# Patient Record
Sex: Female | Born: 1940 | Race: Black or African American | Hispanic: No | Marital: Married | Smoking: Former smoker
Health system: Southern US, Community
[De-identification: ages and names within clinical notes are randomized; demographics above are authoritative.]

## PROBLEM LIST (undated history)

## (undated) DIAGNOSIS — H5010 Unspecified exotropia: Secondary | ICD-10-CM

## (undated) DIAGNOSIS — I4510 Unspecified right bundle-branch block: Secondary | ICD-10-CM

## (undated) DIAGNOSIS — E785 Hyperlipidemia, unspecified: Secondary | ICD-10-CM

## (undated) DIAGNOSIS — I444 Left anterior fascicular block: Secondary | ICD-10-CM

## (undated) DIAGNOSIS — I10 Essential (primary) hypertension: Secondary | ICD-10-CM

## (undated) DIAGNOSIS — H50111 Monocular exotropia, right eye: Secondary | ICD-10-CM

## (undated) DIAGNOSIS — Z973 Presence of spectacles and contact lenses: Secondary | ICD-10-CM

## (undated) DIAGNOSIS — Z972 Presence of dental prosthetic device (complete) (partial): Secondary | ICD-10-CM

## (undated) DIAGNOSIS — H409 Unspecified glaucoma: Secondary | ICD-10-CM

## (undated) DIAGNOSIS — E119 Type 2 diabetes mellitus without complications: Secondary | ICD-10-CM

## (undated) DIAGNOSIS — N393 Stress incontinence (female) (male): Secondary | ICD-10-CM

## (undated) HISTORY — PX: GLAUCOMA SURGERY: SHX656

## (undated) HISTORY — PX: EYE SURGERY: SHX253

---

## 1966-11-13 HISTORY — PX: CHOLECYSTECTOMY OPEN: SUR202

## 2000-12-18 ENCOUNTER — Encounter: Payer: Self-pay | Admitting: *Deleted

## 2000-12-18 ENCOUNTER — Ambulatory Visit (HOSPITAL_COMMUNITY): Admission: RE | Admit: 2000-12-18 | Discharge: 2000-12-18 | Payer: Self-pay

## 2002-07-24 ENCOUNTER — Encounter: Payer: Self-pay | Admitting: *Deleted

## 2002-07-24 ENCOUNTER — Encounter: Admission: RE | Admit: 2002-07-24 | Discharge: 2002-07-24 | Payer: Self-pay | Admitting: *Deleted

## 2005-10-06 ENCOUNTER — Ambulatory Visit (HOSPITAL_COMMUNITY): Admission: RE | Admit: 2005-10-06 | Discharge: 2005-10-06 | Payer: Self-pay | Admitting: *Deleted

## 2005-10-24 ENCOUNTER — Ambulatory Visit (HOSPITAL_COMMUNITY): Admission: RE | Admit: 2005-10-24 | Discharge: 2005-10-24 | Payer: Self-pay | Admitting: *Deleted

## 2006-03-21 ENCOUNTER — Ambulatory Visit: Payer: Self-pay | Admitting: Family Medicine

## 2007-08-12 ENCOUNTER — Encounter: Admission: RE | Admit: 2007-08-12 | Discharge: 2007-08-12 | Payer: Self-pay | Admitting: Family Medicine

## 2007-08-21 ENCOUNTER — Encounter: Admission: RE | Admit: 2007-08-21 | Discharge: 2007-08-21 | Payer: Self-pay | Admitting: Family Medicine

## 2009-06-11 ENCOUNTER — Emergency Department (HOSPITAL_COMMUNITY): Admission: EM | Admit: 2009-06-11 | Discharge: 2009-06-11 | Payer: Self-pay | Admitting: Emergency Medicine

## 2009-07-27 ENCOUNTER — Encounter: Admission: RE | Admit: 2009-07-27 | Discharge: 2009-07-27 | Payer: Self-pay | Admitting: Neurology

## 2010-03-03 ENCOUNTER — Encounter (INDEPENDENT_AMBULATORY_CARE_PROVIDER_SITE_OTHER): Payer: Self-pay | Admitting: *Deleted

## 2010-03-09 ENCOUNTER — Encounter: Admission: RE | Admit: 2010-03-09 | Discharge: 2010-03-09 | Payer: Self-pay | Admitting: Family Medicine

## 2010-03-10 ENCOUNTER — Encounter: Admission: RE | Admit: 2010-03-10 | Discharge: 2010-03-10 | Payer: Self-pay | Admitting: Family Medicine

## 2010-03-16 ENCOUNTER — Encounter (INDEPENDENT_AMBULATORY_CARE_PROVIDER_SITE_OTHER): Payer: Self-pay

## 2010-03-18 ENCOUNTER — Ambulatory Visit: Payer: Self-pay | Admitting: Gastroenterology

## 2010-03-18 ENCOUNTER — Encounter (INDEPENDENT_AMBULATORY_CARE_PROVIDER_SITE_OTHER): Payer: Self-pay

## 2010-04-01 ENCOUNTER — Ambulatory Visit: Payer: Self-pay | Admitting: Gastroenterology

## 2010-08-16 ENCOUNTER — Emergency Department (HOSPITAL_COMMUNITY): Admission: EM | Admit: 2010-08-16 | Discharge: 2010-08-16 | Payer: Self-pay | Admitting: Emergency Medicine

## 2010-12-13 NOTE — Letter (Signed)
Summary: Diabetic Instructions  [facility]  [address] [place], [place] [address]   Phone: [phone]  Fax: [phone]    Katherine [name] [birth year] MRN: [id number]   _  _   ORAL DIABETIC MEDICATION INSTRUCTIONS  The day before your procedure:   Take your diabetic pill as you do normally  The day of your procedure:   Do not take your diabetic pill    We will check your blood sugar levels during the admission process and again in Recovery before discharging you home  ________________________________________________________________________  _  _   INSULIN (LONG ACTING) MEDICATION INSTRUCTIONS (Lantus, NPH, 70/30, Humulin, Novolin-N)   The day before your procedure:   Take  your regular evening dose    The day of your procedure:   Do not take your morning dose    _  _   INSULIN (SHORT ACTING) MEDICATION INSTRUCTIONS (Regular, Humulog, Novolog)   The day before your procedure:   Do not take your evening dose   The day of your procedure:   Do not take your morning dose   _  _   INSULIN PUMP MEDICATION INSTRUCTIONS  We will contact the physician managing your diabetic care for written dosage instructions for the day before your procedure and the day of your procedure.  Once we have received the instructions, we will contact you.

## 2010-12-13 NOTE — Letter (Signed)
Summary: Previsit letter  [facility]  [address] [place], [place] [address]   Phone: [phone]  Fax: [phone]       03/03/2010 MRN: [id number]  [facility] [address] [place], [place]  [address]  Dear Ms. Hannon,  Welcome to the Gastroenterology Division at [facility].    You are scheduled to see a nurse for your pre-procedure visit on 03-18-10 at 11:00a.m. on the 3rd floor at [facility], [address]  We ask that you try to arrive at our office 15 minutes prior to your appointment time to allow for check-in.  Your nurse visit will consist of discussing your medical and surgical history, your immediate family medical history, and your medications.    Please bring a complete list of all your medications or, if you prefer, bring the medication bottles and we will list them.  We will need to be aware of both prescribed and over the counter drugs.  We will need to know exact dosage information as well.  If you are on blood thinners (Coumadin, Plavix, Aggrenox, Ticlid, etc.) please call our office today/prior to your appointment, as we need to consult with your physician about holding your medication.   Please be prepared to read and sign documents such as consent forms, a financial agreement, and acknowledgement forms.  If necessary, and with your consent, a friend or relative is welcome to sit-in on the nurse visit with you.  Please bring your insurance card so that we may make a copy of it.  If your insurance requires a referral to see a specialist, please bring your referral form from your primary care physician.  No co-pay is required for this nurse visit.     If you cannot keep your appointment, please call [phone] to cancel or reschedule prior to your appointment date.  This allows [place] the opportunity to schedule an appointment for another patient in need of care.    Thank you for choosing [facility] for your medical needs.   We appreciate the opportunity to care for you.  Please visit [place] at our website  to learn more about our practice.                     Sincerely.                                                                                                                   The Gastroenterology Division

## 2010-12-13 NOTE — Procedures (Signed)
Summary: Colonoscopy  Patient: Katherine [name] Note: All result statuses are Final unless otherwise noted.  Tests: (1) Colonoscopy (COL)   COL Colonoscopy           DONE     [facility]     [address]     [place], [place]  [address]           COLONOSCOPY PROCEDURE REPORT           PATIENT:  Katherine, [name]  MR#:  [id number]     BIRTHDATE:  06-17-1941, 69 yrs. old  GENDER:  female           ENDOSCOPIST:  Barbette Hair. Arlyce Dice, MD     Referred by:           PROCEDURE DATE:  04/01/2010     PROCEDURE:  Diagnostic Colonoscopy     ASA CLASS:  Class II     INDICATIONS:  1) Routine Risk Screening           MEDICATIONS:   Fentanyl 75 mcg IV, Versed 6 mg IV           DESCRIPTION OF PROCEDURE:   After the risks benefits and     alternatives of the procedure were thoroughly explained, informed     consent was obtained.  Digital rectal exam was performed and     revealed rectal prolapse.   The Pentax Ped Colon [id number] endoscope     was introduced through the anus and advanced to the cecum, which     was identified by the ileocecal valve, without limitations.  The     quality of the prep was excellent, using MoviPrep.  The instrument     was then slowly withdrawn as the colon was fully     examined.<<PROCEDUREIMAGES>>           FINDINGS:  Diverticula were found (see image1 and image11).     Moderate diverticulosis sigmoid to ascending colon  This was     otherwise a normal examination of the colon (see image4, image5,     image6, image7, image9, image10, image12, and image13).     Retroflexed views in the rectum revealed no abnormalities.    The     time to cecum =  3.75  minutes. The scope was then withdrawn (time     =  6.75  min) from the patient and the procedure completed.           COMPLICATIONS:  None           ENDOSCOPIC IMPRESSION:     1) Diverticula     2) Otherwise normal examination     RECOMMENDATIONS:     1) Continue current colorectal screening recommendations  for     "routine risk" patients with a repeat colonoscopy in 10 years.           REPEAT EXAM:  In 10 year(s) for Colonoscopy.           ______________________________     Barbette Hair. Arlyce Dice, MD           CC: Clyda Greener, MD           n.     Rosalie Doctor:   Barbette Hair. Kaplan at 04/01/2010 12:30 PM           Faythe Ghee, [id number]  Note: An exclamation mark (!) indicates a result that was not dispersed into the flowsheet. Document Creation Date: 04/01/2010 1:54 PM _______________________________________________________________________  Marland Kitchen  1) Order result status: Final Collection or observation date-time: 04/01/2010 12:25 Requested date-time:  Receipt date-time:  Reported date-time:  Referring Physician:   Ordering Physician: Melvia Heaps [phone]) Specimen Source:  Source: Launa Grill Order Number: [id number] Lab site:   Appended Document: Colonoscopy    Clinical Lists Changes  Observations: Added new observation of COLONNXTDUE: 03/2020 (04/01/2010 13:33)

## 2010-12-13 NOTE — Letter (Signed)
Summary: [facility] Instructions  [facility]  [address] [place], [place] [address]   Phone: [phone]  Fax: [phone]       EMMAMARIE KLUENDER    [birth year]    MRN: [id number]        Procedure Day Dorna Bloom:  Farrell Ours  04/01/10     Arrival Time:  10:30AM      Procedure Time:  11:30AM     Location of Procedure:                    _X _  [facility] (4th Floor)                        PREPARATION FOR COLONOSCOPY WITH MOVIPREP   Starting 5 days prior to your procedure 03/27/10 do not eat nuts, seeds, popcorn, corn, beans, peas,  salads, or any raw vegetables.  Do not take any fiber supplements (e.g. Metamucil, Citrucel, and Benefiber).  THE DAY BEFORE YOUR PROCEDURE         DATE: 03/31/10  DAY: THURSDAY  1.  Drink clear liquids the entire day-NO SOLID FOOD  2.  Do not drink anything colored red or purple.  Avoid juices with pulp.  No orange juice.  3.  Drink at least 64 oz. (8 glasses) of fluid/clear liquids during the day to prevent dehydration and help the prep work efficiently.  CLEAR LIQUIDS INCLUDE: Water Jello Ice Popsicles Tea (sugar ok, no milk/cream) Powdered fruit flavored drinks Coffee (sugar ok, no milk/cream) Gatorade Juice: apple, white grape, white cranberry  Lemonade Clear bullion, consomm, broth Carbonated beverages (any kind) Strained chicken noodle soup Hard Candy                             4.  In the morning, mix first dose of MoviPrep solution:    Empty 1 Pouch A and 1 Pouch B into the disposable container    Add lukewarm drinking water to the top line of the container. Mix to dissolve    Refrigerate (mixed solution should be used within 24 hrs)  5.  Begin drinking the prep at 5:00 p.m. The MoviPrep container is divided by 4 marks.   Every 15 minutes drink the solution down to the next mark (approximately 8 oz) until the full liter is complete.   6.  Follow completed prep with 16 oz of clear liquid of your choice  (Nothing red or purple).  Continue to drink clear liquids until bedtime.  7.  Before going to bed, mix second dose of MoviPrep solution:    Empty 1 Pouch A and 1 Pouch B into the disposable container    Add lukewarm drinking water to the top line of the container. Mix to dissolve    Refrigerate  THE DAY OF YOUR PROCEDURE      DATE: 04/01/10   DAY: FRIDAY  Beginning at 6:30AM (5 hours before procedure):         1. Every 15 minutes, drink the solution down to the next mark (approx 8 oz) until the full liter is complete.  2. Follow completed prep with 16 oz. of clear liquid of your choice.    3. You may drink clear liquids until 9:30AM (2 HOURS BEFORE PROCEDURE).   MEDICATION INSTRUCTIONS  Unless otherwise instructed, you should take regular prescription medications with a small sip of water   as early as possible  the morning of your procedure.  Diabetic patients - see separate instructions.         OTHER INSTRUCTIONS  You will need a responsible adult at least 68 years of age to accompany you and drive you home.   This person must remain in the waiting room during your procedure.  Wear loose fitting clothing that is easily removed.  Leave jewelry and other valuables at home.  However, you may wish to bring a book to read or  an iPod/MP3 player to listen to music as you wait for your procedure to start.  Remove all body piercing jewelry and leave at home.  Total time from sign-in until discharge is approximately 2-3 hours.  You should go home directly after your procedure and rest.  You can resume normal activities the  day after your procedure.  The day of your procedure you should not:   Drive   Make legal decisions   Operate machinery   Drink alcohol   Return to work  You will receive specific instructions about eating, activities and medications before you leave.    The above instructions have been reviewed and explained to me by   Ulis Rias RN  Mar 18, 2010 10:53 AM     I fully understand and can verbalize these instructions _____________________________ Date _________

## 2010-12-13 NOTE — Miscellaneous (Signed)
Summary: Lec previsit  Clinical Lists Changes  Medications: Added new medication of MOVIPREP 100 GM  SOLR (PEG-KCL-NACL-NASULF-NA ASC-C) As per prep instructions. - Signed Rx of MOVIPREP 100 GM  SOLR (PEG-KCL-NACL-NASULF-NA ASC-C) As per prep instructions.;  #1 x 0;  Signed;  Entered by: Ulis Rias RN;  Authorized by: Louis Meckel MD;  Method used: Electronically to [id number] [address] [place], [place], [place]  [address], Ph: [phone] or [phone], Fax: [phone] Observations: Added new observation of NKA: T (03/18/2010 10:05)    Prescriptions: MOVIPREP 100 GM  SOLR (PEG-KCL-NACL-NASULF-NA ASC-C) As per prep instructions.  #1 x 0   Entered by:   Ulis Rias RN   Authorized by:   Louis Meckel MD   Signed by:   Ulis Rias RN on 03/18/2010   Method used:   Electronically to        [facility] [phone]* (retail)       [address]       [place], [place]  [address]       Ph: [phone] or [phone]       Fax: [phone]   RxID:   [id number]

## 2011-02-19 LAB — URINALYSIS, ROUTINE W REFLEX MICROSCOPIC
Ketones, ur: NEGATIVE mg/dL
Nitrite: NEGATIVE
Protein, ur: NEGATIVE mg/dL
Urobilinogen, UA: 1 mg/dL (ref 0.0–1.0)

## 2011-02-19 LAB — DIFFERENTIAL
Basophils Absolute: 0 10*3/uL (ref 0.0–0.1)
Basophils Relative: 0 % (ref 0–1)
Eosinophils Absolute: 0.1 10*3/uL (ref 0.0–0.7)
Lymphocytes Relative: 41 % (ref 12–46)
Monocytes Absolute: 0.5 10*3/uL (ref 0.1–1.0)

## 2011-02-19 LAB — BASIC METABOLIC PANEL
BUN: 12 mg/dL (ref 6–23)
Chloride: 107 mEq/L (ref 96–112)
GFR calc non Af Amer: >60 mL/min (ref >60)
Glucose, Bld: 191 mg/dL — ABNORMAL HIGH (ref 70–99)
Sodium: 139 mEq/L (ref 135–145)

## 2011-02-19 LAB — CBC
HCT: 40.1 % (ref 36.0–46.0)
MCHC: 34.1 g/dL (ref 30.0–36.0)
MCV: 92.1 fL (ref 78.0–100.0)
RBC: 4.35 MIL/uL (ref 3.87–5.11)
RDW: 12.6 % (ref 11.5–15.5)
WBC: 6.2 10*3/uL (ref 4.0–10.5)

## 2011-02-19 LAB — URINE CULTURE: Colony Count: 75000

## 2011-02-28 ENCOUNTER — Ambulatory Visit
Admission: RE | Admit: 2011-02-28 | Discharge: 2011-02-28 | Disposition: A | Discharge status: Home / Self Care | Payer: Medicare Other | Source: Ambulatory Visit | Attending: Family Medicine | Admitting: Family Medicine

## 2011-02-28 ENCOUNTER — Other Ambulatory Visit: Payer: Self-pay | Admitting: Family Medicine

## 2011-02-28 DIAGNOSIS — S2239XA Fracture of one rib, unspecified side, initial encounter for closed fracture: Secondary | ICD-10-CM

## 2011-06-29 ENCOUNTER — Other Ambulatory Visit: Payer: Self-pay | Admitting: Family Medicine

## 2011-06-29 ENCOUNTER — Ambulatory Visit
Admission: RE | Admit: 2011-06-29 | Discharge: 2011-06-29 | Disposition: A | Discharge status: Home / Self Care | Payer: Medicare Other | Source: Ambulatory Visit | Attending: Family Medicine | Admitting: Family Medicine

## 2011-06-29 DIAGNOSIS — S2239XA Fracture of one rib, unspecified side, initial encounter for closed fracture: Secondary | ICD-10-CM

## 2012-06-25 ENCOUNTER — Other Ambulatory Visit (HOSPITAL_COMMUNITY): Payer: Self-pay | Admitting: Family Medicine

## 2012-06-25 ENCOUNTER — Ambulatory Visit (HOSPITAL_COMMUNITY)
Admission: RE | Admit: 2012-06-25 | Discharge: 2012-06-25 | Disposition: A | Discharge status: Home / Self Care | Payer: Medicare Other | Source: Ambulatory Visit | Attending: Family Medicine | Admitting: Family Medicine

## 2012-06-25 ENCOUNTER — Inpatient Hospital Stay (HOSPITAL_COMMUNITY): Admission: RE | Admit: 2012-06-25 | Payer: Medicare Other | Source: Ambulatory Visit

## 2012-06-25 DIAGNOSIS — I10 Essential (primary) hypertension: Secondary | ICD-10-CM

## 2012-06-25 DIAGNOSIS — R911 Solitary pulmonary nodule: Secondary | ICD-10-CM | POA: Insufficient documentation

## 2012-06-25 DIAGNOSIS — F17200 Nicotine dependence, unspecified, uncomplicated: Secondary | ICD-10-CM | POA: Insufficient documentation

## 2012-06-25 DIAGNOSIS — I452 Bifascicular block: Secondary | ICD-10-CM | POA: Insufficient documentation

## 2012-06-25 DIAGNOSIS — R9431 Abnormal electrocardiogram [ECG] [EKG]: Secondary | ICD-10-CM | POA: Insufficient documentation

## 2012-06-25 DIAGNOSIS — R000 Tachycardia, unspecified: Secondary | ICD-10-CM

## 2012-06-25 LAB — COMPREHENSIVE METABOLIC PANEL
ALT: 17 U/L (ref 0–35)
Alkaline Phosphatase: 75 U/L (ref 39–117)
Calcium: 10 mg/dL (ref 8.4–10.5)
GFR calc Af Amer: >90 mL/min (ref >90)
GFR calc non Af Amer: 87 mL/min — ABNORMAL LOW (ref >90)
Total Bilirubin: 0.3 mg/dL (ref 0.3–1.2)
Total Protein: 7.6 g/dL (ref 6.0–8.3)

## 2012-06-25 LAB — LIPID PANEL
Cholesterol: 172 mg/dL (ref 0–200)
LDL Cholesterol: 46 mg/dL (ref 0–99)
Triglycerides: 125 mg/dL (ref <150)

## 2012-06-25 LAB — HEMOGLOBIN A1C
Hgb A1c MFr Bld: 6.5 % — ABNORMAL HIGH (ref <5.7)
Mean Plasma Glucose: 140 mg/dL — ABNORMAL HIGH (ref <117)

## 2012-06-25 LAB — CBC
HCT: 40.7 % (ref 36.0–46.0)
Hemoglobin: 13.5 g/dL (ref 12.0–15.0)
MCHC: 33.2 g/dL (ref 30.0–36.0)
MCV: 89.6 fL (ref 78.0–100.0)
RBC: 4.54 MIL/uL (ref 3.87–5.11)

## 2012-06-25 LAB — TSH: TSH: 0.585 u[IU]/mL (ref 0.350–4.500)

## 2012-07-01 ENCOUNTER — Other Ambulatory Visit (HOSPITAL_COMMUNITY): Payer: Self-pay | Admitting: Family Medicine

## 2012-07-01 DIAGNOSIS — R911 Solitary pulmonary nodule: Secondary | ICD-10-CM

## 2012-07-03 ENCOUNTER — Ambulatory Visit (HOSPITAL_COMMUNITY)
Admission: RE | Admit: 2012-07-03 | Discharge: 2012-07-03 | Disposition: A | Discharge status: Home / Self Care | Payer: Medicare Other | Source: Ambulatory Visit | Attending: Family Medicine | Admitting: Family Medicine

## 2012-07-03 DIAGNOSIS — R918 Other nonspecific abnormal finding of lung field: Secondary | ICD-10-CM | POA: Insufficient documentation

## 2012-07-03 DIAGNOSIS — R911 Solitary pulmonary nodule: Secondary | ICD-10-CM

## 2012-07-03 MED ORDER — IOHEXOL 300 MG/ML  SOLN
80.0000 mL | Freq: Once | INTRAMUSCULAR | Status: AC | PRN
  Administered 2012-07-03: 80 mL via INTRAVENOUS

## 2012-08-30 ENCOUNTER — Emergency Department (HOSPITAL_COMMUNITY): Payer: Medicare Other

## 2012-08-30 ENCOUNTER — Emergency Department (HOSPITAL_COMMUNITY)
Admission: EM | Admit: 2012-08-30 | Discharge: 2012-08-30 | Disposition: A | Discharge status: Home / Self Care | Payer: Medicare Other | Attending: Emergency Medicine | Admitting: Emergency Medicine

## 2012-08-30 ENCOUNTER — Encounter (HOSPITAL_COMMUNITY): Payer: Self-pay | Admitting: *Deleted

## 2012-08-30 DIAGNOSIS — E119 Type 2 diabetes mellitus without complications: Secondary | ICD-10-CM | POA: Insufficient documentation

## 2012-08-30 DIAGNOSIS — S1093XA Contusion of unspecified part of neck, initial encounter: Secondary | ICD-10-CM | POA: Insufficient documentation

## 2012-08-30 DIAGNOSIS — R220 Localized swelling, mass and lump, head: Secondary | ICD-10-CM | POA: Insufficient documentation

## 2012-08-30 DIAGNOSIS — I10 Essential (primary) hypertension: Secondary | ICD-10-CM | POA: Insufficient documentation

## 2012-08-30 DIAGNOSIS — R0602 Shortness of breath: Secondary | ICD-10-CM | POA: Insufficient documentation

## 2012-08-30 DIAGNOSIS — M542 Cervicalgia: Secondary | ICD-10-CM | POA: Insufficient documentation

## 2012-08-30 DIAGNOSIS — T1490XA Injury, unspecified, initial encounter: Secondary | ICD-10-CM | POA: Insufficient documentation

## 2012-08-30 DIAGNOSIS — S0003XA Contusion of scalp, initial encounter: Secondary | ICD-10-CM | POA: Insufficient documentation

## 2012-08-30 DIAGNOSIS — M549 Dorsalgia, unspecified: Secondary | ICD-10-CM | POA: Insufficient documentation

## 2012-08-30 HISTORY — DX: Essential (primary) hypertension: I10

## 2012-08-30 MED ORDER — HYDROCODONE-ACETAMINOPHEN 5-325 MG PO TABS
1.0000 | ORAL_TABLET | Freq: Once | ORAL | Status: AC
  Administered 2012-08-30: 1 via ORAL

## 2012-08-30 MED ORDER — HYDROCODONE-ACETAMINOPHEN 5-325 MG PO TABS: 1.0000 | ORAL_TABLET | Freq: Four times a day (QID) | ORAL | Status: DC | PRN

## 2012-08-30 NOTE — ED Notes (Signed)
[employer] informed.

## 2012-08-30 NOTE — ED Notes (Signed)
[employer] in with pt. 
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
## 2012-08-30 NOTE — ED Provider Notes (Signed)
Medical screening examination/treatment/procedure(s) were performed by non-physician practitioner and as supervising physician I was immediately available for consultation/collaboration.  Kollin Udell, MD 08/30/12 1722 

## 2013-05-28 IMAGING — CT CT CHEST W/ CM
2 of 4 series · 15 of 36 positions shown, 18 images · IV contrast (OMNIPAQUE)
Comparison: Chest radiograph 06/26/2011

CLINICAL DATA: Evaluate for a right upper lobe nodule.

CT CHEST WITH CONTRAST
TECHNIQUE: Multidetector CT imaging of the chest was performed
following the standard protocol during bolus administration of
intravenous contrast.
Contrast: 80mL OMNIPAQUE IOHEXOL 300 MG/ML  SOLN

[Series 2: chest with st · axial · 0.74mm/px · z∈[-189,+91]mm · 12 of 66 slices shown, 15 images]
[im 5/66  mediastinal]
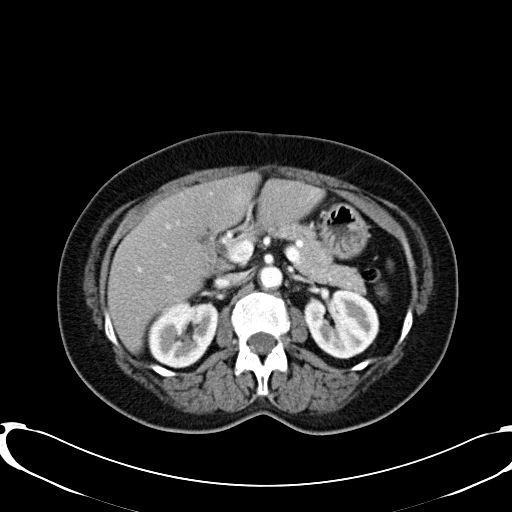
[im 5/66  lung]
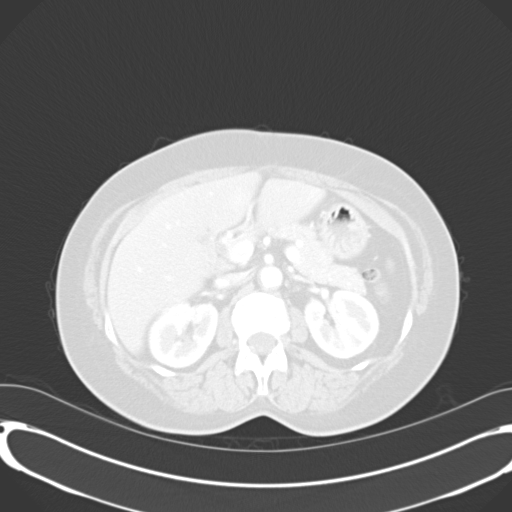
[im 10/66  lung]
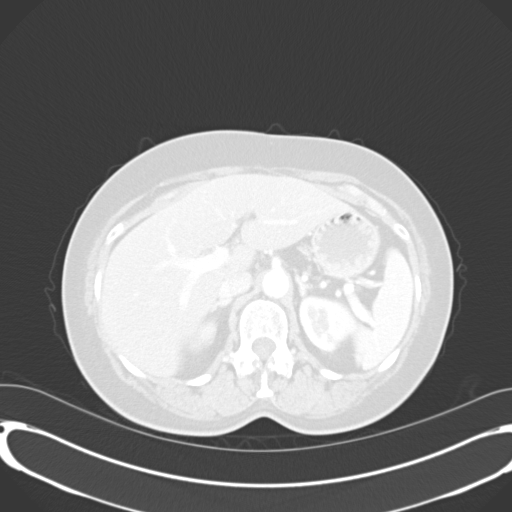
[im 14/66  lung]
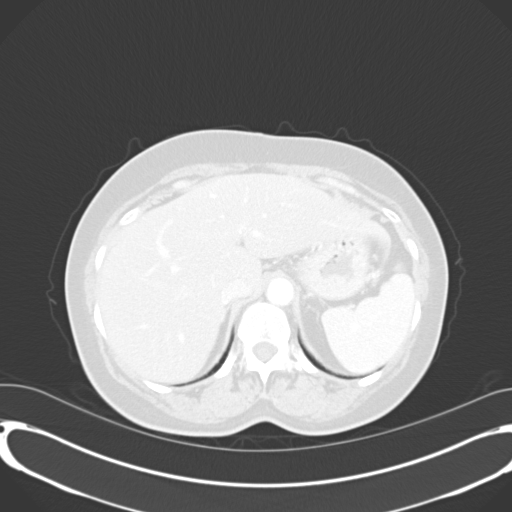
[im 19/66  lung]
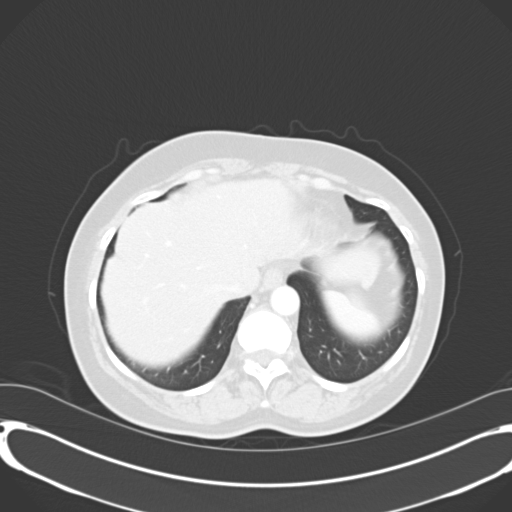
[im 24/66  mediastinal]
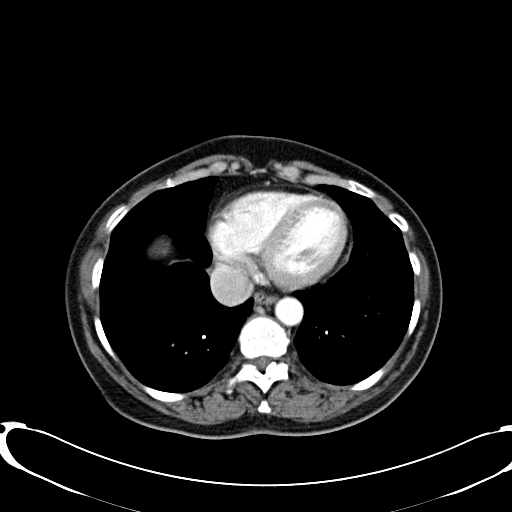
[im 24/66  lung]
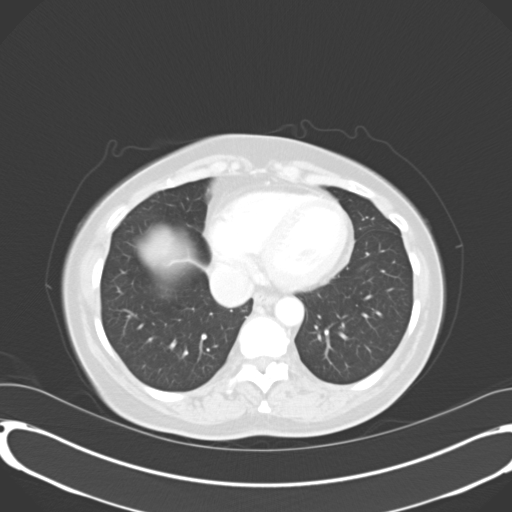
[im 28/66  lung]
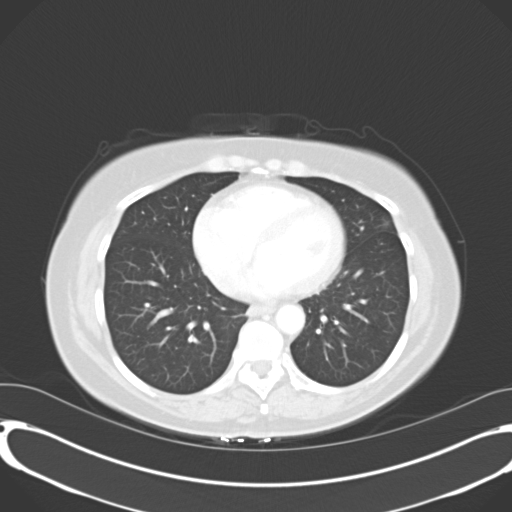
[im 38/66  lung]
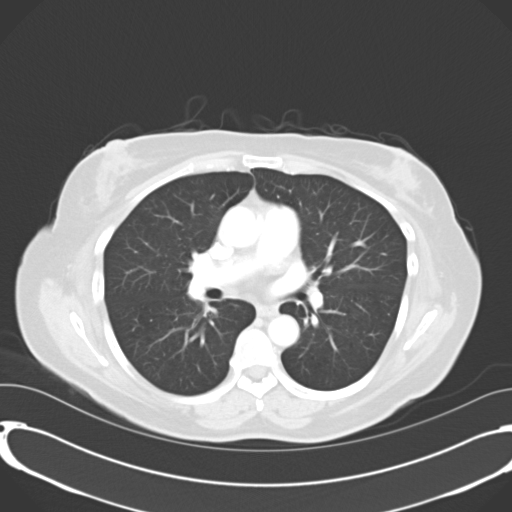
[im 42/66  lung]
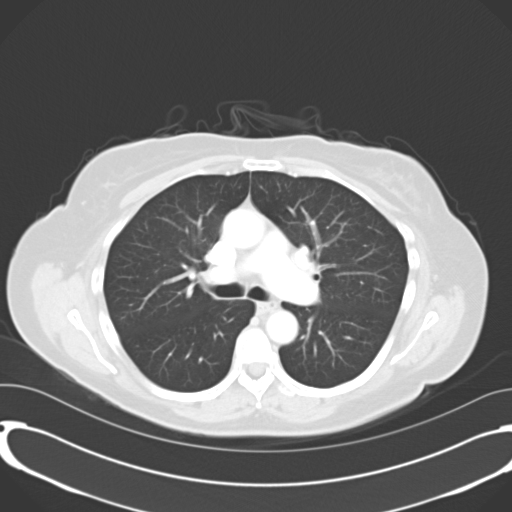
[im 47/66  mediastinal]
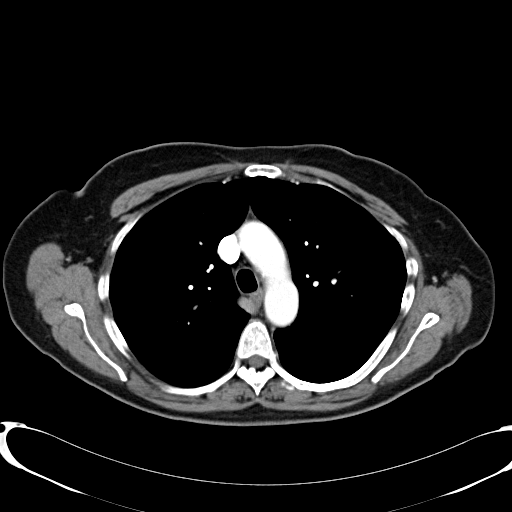
[im 47/66  lung]
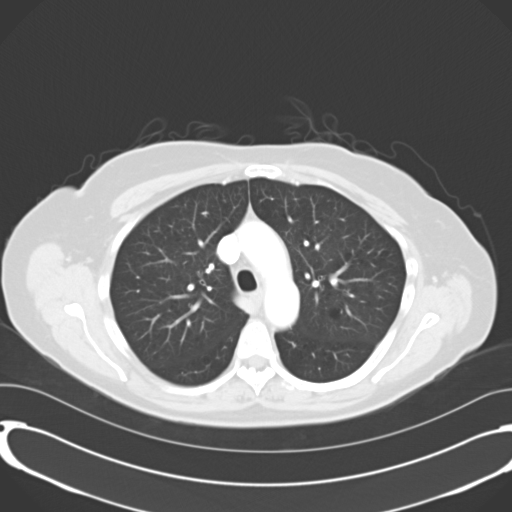
[im 52/66  lung]
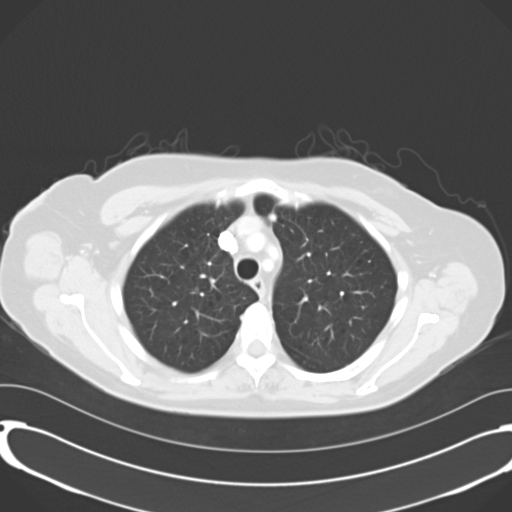
[im 56/66  lung]
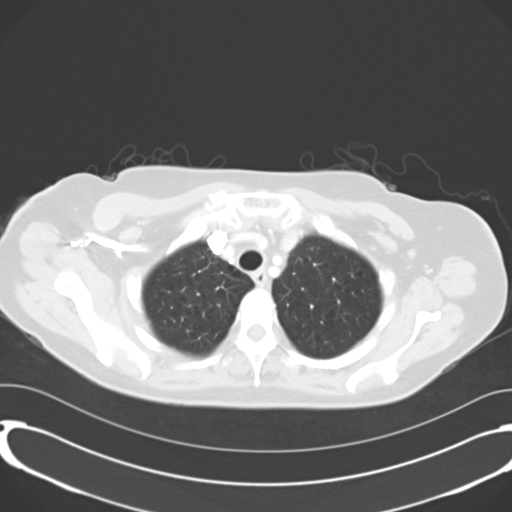
[im 61/66  lung]
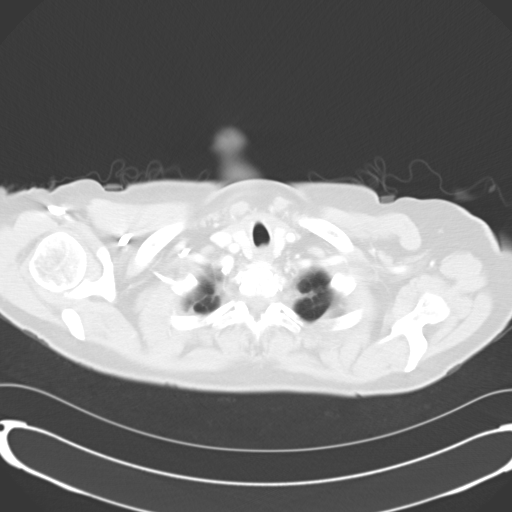

[Series 602: <mpr thick range> · coronal · 0.74mm/px · 3 of 76 slices shown]
[im 16/76  lung]
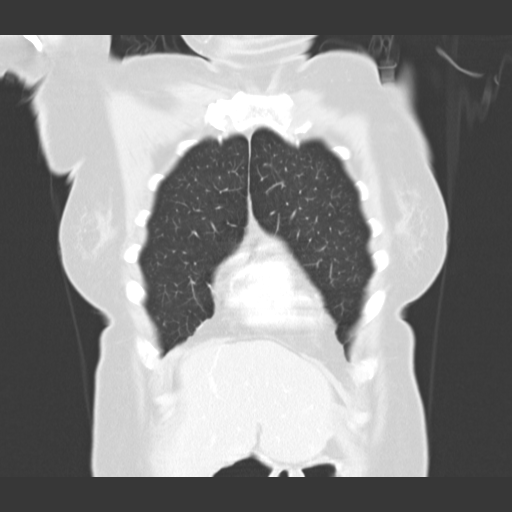
[im 31/76  lung]
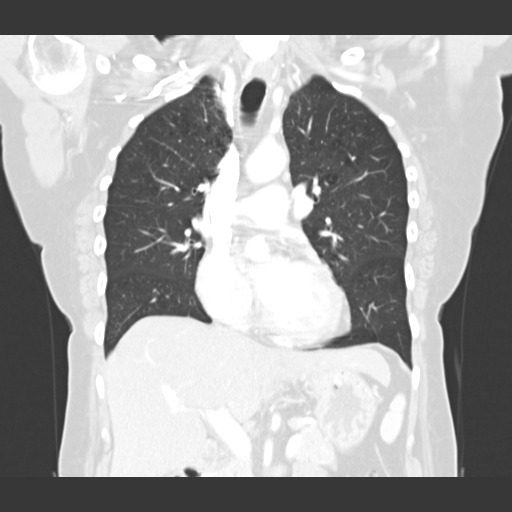
[im 46/76  lung]
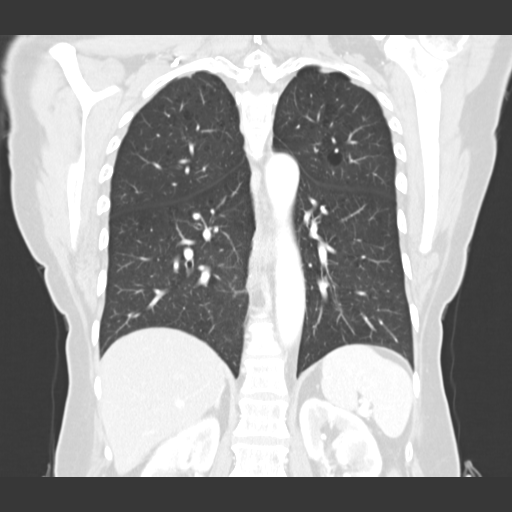

[15 of 36 positions shown; findings below may reference images not displayed]

FINDINGS: There is no evidence for chest lymphadenopathy.  No
significant pericardial or pleural fluid.  Decreased attenuation of
the liver could represent some hepatic steatosis.  Normal
appearance of the adrenal tissue, spleen, visualized kidneys and
visualized pancreas.  Main portal venous system is patent.

The trachea and mainstem bronchi are patent. There are mild
emphysematous changes in the upper lungs.  There are no suspicious
pulmonary nodules.  Lungs are clear without parenchymal disease.
No acute bony abnormality. There are prominent degenerative changes
along the anterior right first rib and this may account for the
abnormality on the chest radiograph.  There is a 5 mm dense nodular
structure in the left anterior chest just anterior to the left
innominate vein.  This is probably a calcification or a vascular
structure.
IMPRESSION: There are no suspicious pulmonary nodules.  The density on the
recent chest radiograph most likely represents bone.

No acute chest abnormalities.

## 2013-08-06 ENCOUNTER — Other Ambulatory Visit: Payer: Self-pay | Admitting: Family Medicine

## 2013-08-06 ENCOUNTER — Ambulatory Visit
Admission: RE | Admit: 2013-08-06 | Discharge: 2013-08-06 | Disposition: A | Discharge status: Home / Self Care | Payer: Medicare Other | Source: Ambulatory Visit | Attending: Family Medicine | Admitting: Family Medicine

## 2013-08-06 DIAGNOSIS — I10 Essential (primary) hypertension: Secondary | ICD-10-CM

## 2013-11-30 ENCOUNTER — Encounter (HOSPITAL_COMMUNITY): Payer: Self-pay | Admitting: Emergency Medicine

## 2013-11-30 ENCOUNTER — Emergency Department (INDEPENDENT_AMBULATORY_CARE_PROVIDER_SITE_OTHER)
Admission: EM | Admit: 2013-11-30 | Discharge: 2013-11-30 | Disposition: A | Discharge status: Home / Self Care | Payer: Medicare Other | Source: Home / Self Care | Attending: Emergency Medicine | Admitting: Emergency Medicine

## 2013-11-30 DIAGNOSIS — K047 Periapical abscess without sinus: Secondary | ICD-10-CM

## 2013-11-30 DIAGNOSIS — H579 Unspecified disorder of eye and adnexa: Secondary | ICD-10-CM

## 2013-11-30 DIAGNOSIS — H5789 Other specified disorders of eye and adnexa: Secondary | ICD-10-CM

## 2013-11-30 MED ORDER — PENICILLIN V POTASSIUM 500 MG PO TABS: 500.0000 mg | ORAL_TABLET | Freq: Four times a day (QID) | ORAL | Status: AC

## 2013-11-30 MED ORDER — HYDROCODONE-ACETAMINOPHEN 5-325 MG PO TABS
1.0000 | ORAL_TABLET | Freq: Four times a day (QID) | ORAL | Status: DC | PRN
Start: 2013-11-30 — End: 2015-05-13

## 2013-11-30 MED ORDER — TETRACAINE HCL 0.5 % OP SOLN
OPHTHALMIC | Status: AC
  Filled 2013-11-30: qty 2

## 2013-11-30 MED ORDER — TETRACAINE HCL 0.5 % OP SOLN
2.0000 [drp] | Freq: Once | OPHTHALMIC | Status: AC
  Administered 2013-11-30: 2 [drp] via OPHTHALMIC

## 2013-11-30 NOTE — ED Provider Notes (Signed)
Medical screening examination/treatment/procedure(s) were performed by non-physician practitioner and as supervising physician I was immediately available for consultation/collaboration.  Philipp Deputy, M.D.  Harden Mo, MD 11/30/13 2139

## 2013-11-30 NOTE — ED Provider Notes (Signed)
CSN: [id number]     Arrival date & time 11/30/13  8527 History   First MD Initiated Contact with Patient 11/30/13 1212     Chief Complaint  Patient presents with  . Dental Pain    eye pain   (Consider location/radiation/quality/duration/timing/severity/associated sxs/prior Treatment) HPI Comments: Pt feels something got into her eye last night, and is moving around causing her pain.   Patient is a 73 y.o. female presenting with tooth pain and foreign body in eye. The history is provided by the patient.  Dental Pain Location:  Lower Lower teeth location:  27/RL cuspid Quality:  Aching Severity:  Moderate Onset quality:  Gradual Duration:  4 days Timing:  Constant Progression:  Worsening Chronicity:  New Relieved by:  None tried Worsened by:  Nothing tried Ineffective treatments:  None tried Associated symptoms: gum swelling   Associated symptoms: no difficulty swallowing, no facial swelling and no fever   Risk factors: diabetes   Foreign Body in Eye This is a new problem. The current episode started 12 to 24 hours ago. The problem occurs constantly. The problem has not changed since onset.Nothing aggravates the symptoms. Nothing relieves the symptoms. She has tried nothing for the symptoms.    Past Medical History  Diagnosis Date  . Diabetes mellitus without complication   . Hypertension    Past Surgical History  Procedure Laterality Date  . Cholecystectomy     History reviewed. No pertinent family history. History  Substance Use Topics  . Smoking status: Current Every Day Smoker -- 0.50 packs/day    Types: Cigarettes  . Smokeless tobacco: Not on file  . Alcohol Use: 2.4 oz/week    4 Cans of beer per week   OB History   Grav Para Term Preterm Abortions TAB SAB Ect Mult Living                 Review of Systems  Constitutional: Negative for fever and chills.  HENT: Positive for dental problem. Negative for facial swelling.   Eyes: Positive for pain. Negative for  visual disturbance.       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  Home Medications   Current Outpatient Rx  Name  Route  Sig  Dispense  Refill  . glyBURIDE (DIABETA) 5 MG tablet   Oral   Take 5-10 mg by mouth 2 (two) times daily with a meal. 2 tabs in am, 1 tab in pm         . quinapril (ACCUPRIL) 10 MG tablet   Oral   Take 10 mg by mouth daily.         Marland Kitchen HYDROcodone-acetaminophen (NORCO/VICODIN) 5-325 MG per tablet   Oral   Take 1 tablet by mouth every 6 (six) hours as needed. For pain.         Marland Kitchen HYDROcodone-acetaminophen (NORCO/VICODIN) 5-325 MG per tablet   Oral   Take 1 tablet by mouth every 6 (six) hours as needed.   6 tablet   0   . penicillin v potassium (VEETID) 500 MG tablet   Oral   Take 1 tablet (500 mg total) by mouth 4 (four) times daily.   40 tablet   0   . PRESCRIPTION MEDICATION   Oral   Take 1 tablet by mouth daily. Blood pressure  medication.          BP 131/67  Pulse 82  Temp(Src) 98.3 F (36.8 C) (Oral)  Resp 18  SpO2 96% Physical Exam  Constitutional: She appears well-developed and well-nourished.  HENT:  Small abscess on inside of R lower canine gums; most teeth absent, wearing upper dentures  Eyes: Lids are normal. Lids are everted and swept, no foreign bodies found. Right eye exhibits no exudate. No foreign body present in the right eye. Left eye exhibits no exudate. Left pupil is round and reactive.  Slit lamp exam:      The right eye shows fluorescein uptake. The right eye shows no corneal abrasion, no corneal flare, no corneal ulcer and no foreign body.  R eye cornea dystrophic, irregular pupil. No foreign body identified    ED Course  Procedures (including critical care time) Labs Review Labs Reviewed - No data to display Imaging Review No  results found.  EKG Interpretation    Date/Time:    Ventricular Rate:    PR Interval:    QRS Duration:   QT Interval:    QTC Calculation:   R Axis:     Text Interpretation:              MDM   1. Dental abscess   2. Irritation of right eye   Pt to f/u with dentist ASAP. Rx pcn 500mg  QID #40. Rx hydrocodone/apap 5/325 1 po q6hrs prn pain #6. Recommended systane eye drops. Pt to f/u with dr. Ricki Miller with opthamology tomorrow.      Carvel Getting, NP 11/30/13 1250

## 2013-11-30 NOTE — ED Notes (Signed)
Concern for dental abscess and FB sensation in right eye

## 2013-11-30 NOTE — Discharge Instructions (Signed)
Purchase Systane eye drops (they are over the counter) and use as needed for relief of eye pain. Call Dr. Margarita Grizzle office first thing in the morning- you need him to check your eyes tomorrow. See your dentist as soon as possible.    Abscessed Tooth An abscessed tooth is an infection around your tooth. It may be caused by holes or damage to the tooth (cavity) or a dental disease. An abscessed tooth causes mild to very bad pain in and around the tooth. See your dentist right away if you have tooth or gum pain. HOME CARE  Take your medicine as told. Finish it even if you start to feel better.  Do not drive after taking pain medicine.  Rinse your mouth (gargle) often with salt water ( teaspoon salt in 8 ounces of warm water).  Do not apply heat to the outside of your face. GET HELP RIGHT AWAY IF:   You have a temperature by mouth above 102 F (38.9 C), not controlled by medicine.  You have chills and a very bad headache.  You have problems breathing or swallowing.  Your mouth will not open.  You develop puffiness (swelling) on the neck or around the eye.  Your pain is not helped by medicine.  Your pain is getting worse instead of better. MAKE SURE YOU:   Understand these instructions.  Will watch your condition.  Will get help right away if you are not doing well or get worse. Document Released: 04/17/2008 Document Revised: 01/22/2012 Document Reviewed: 02/07/2011 [facility] Patient Information 2014 [place].

## 2014-01-03 ENCOUNTER — Encounter (HOSPITAL_COMMUNITY): Payer: Self-pay | Admitting: Emergency Medicine

## 2014-01-03 ENCOUNTER — Emergency Department (INDEPENDENT_AMBULATORY_CARE_PROVIDER_SITE_OTHER)
Admission: EM | Admit: 2014-01-03 | Discharge: 2014-01-03 | Disposition: A | Discharge status: Home / Self Care | Payer: Medicare Other | Source: Home / Self Care | Attending: Emergency Medicine | Admitting: Emergency Medicine

## 2014-01-03 ENCOUNTER — Emergency Department (INDEPENDENT_AMBULATORY_CARE_PROVIDER_SITE_OTHER): Payer: Medicare Other

## 2014-01-03 DIAGNOSIS — H1810 Bullous keratopathy, unspecified eye: Secondary | ICD-10-CM

## 2014-01-03 DIAGNOSIS — J209 Acute bronchitis, unspecified: Secondary | ICD-10-CM

## 2014-01-03 MED ORDER — DOXYCYCLINE HYCLATE 100 MG PO TABS: 100.0000 mg | ORAL_TABLET | Freq: Two times a day (BID) | ORAL | Status: DC

## 2014-01-03 MED ORDER — POLYMYXIN B-TRIMETHOPRIM 10000-0.1 UNIT/ML-% OP SOLN: 1.0000 [drp] | OPHTHALMIC | Status: DC

## 2014-01-03 MED ORDER — TRAMADOL HCL 50 MG PO TABS: 100.0000 mg | ORAL_TABLET | Freq: Three times a day (TID) | ORAL | Status: DC | PRN

## 2014-01-03 MED ORDER — ALBUTEROL SULFATE HFA 108 (90 BASE) MCG/ACT IN AERS: 1.0000 | INHALATION_SPRAY | Freq: Four times a day (QID) | RESPIRATORY_TRACT | Status: DC | PRN

## 2014-01-03 MED ORDER — TETRACAINE HCL 0.5 % OP SOLN
OPHTHALMIC | Status: AC
  Filled 2014-01-03: qty 2

## 2014-01-03 MED ORDER — IPRATROPIUM-ALBUTEROL 0.5-2.5 (3) MG/3ML IN SOLN
RESPIRATORY_TRACT | Status: AC
Start: 2014-01-03 — End: 2014-01-03
  Filled 2014-01-03: qty 3

## 2014-01-03 MED ORDER — IPRATROPIUM-ALBUTEROL 0.5-2.5 (3) MG/3ML IN SOLN
3.0000 mL | RESPIRATORY_TRACT | Status: DC
  Administered 2014-01-03: 3 mL via RESPIRATORY_TRACT

## 2014-01-03 MED ORDER — OXYCODONE-ACETAMINOPHEN 5-325 MG PO TABS: ORAL_TABLET | ORAL | Status: DC

## 2014-01-03 NOTE — ED Notes (Signed)
Pt   Reports   Cough  Congestion     And  r eye  Irritation  With  Some  Swelling       With  Symptoms   Which  Started    Yesterday          Some  Photosensitivity    Noted

## 2014-01-03 NOTE — ED Provider Notes (Addendum)
Chief Complaint   Chief Complaint  Patient presents with  . Cough    History of Present Illness   Katherine [name] is a 73 year old female in today with 2 problems: A right eye pain and cough.  1. 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 Over the past year or so she has had redness, watering, and foreign body sensation in that eye. 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 There is nothing to be done about this other than enucleation of the eye or corneal transplant. She was seen here for the same thing about a month ago. She was given Systane drops at that time. She followup with Dr. Ricki Miller. He prescribed some corticosteroid drops, but she doesn't feel any better.  2. Cough: This is been going on for 3 days. She's been producing some yellow sputum and had some wheezing. She notes nasal congestion, rhinorrhea with mucus drainage, headache, and chills. She denies any fever, sore throat, or chest pain. She has no history of asthma or COPD, but she is a cigarette smoker.  Review of Systems   Other than as noted above, the patient denies any of the following symptoms: Systemic:  No fevers, chills, sweats, or myalgias. Eye:  No redness or discharge. ENT:  No ear pain, headache, nasal congestion, drainage, sinus pressure, or sore throat. Neck:  No neck pain, stiffness, or swollen glands. Lungs:  No cough, sputum production, hemoptysis, wheezing, chest tightness, shortness of breath or chest pain. GI:  No abdominal pain, nausea, vomiting or diarrhea.  Brandon   Past medical history, family history, social history, meds, and allergies were reviewed. She has high blood pressure and diabetes. No known medication allergies. Current meds include several eyedrops, glyburide, and Accupril.  Physical exam   Vital signs:  BP 125/70  Pulse 91  Temp(Src) 98.6 F (37 C)  (Oral)  Resp 18  SpO2 97% General:  Alert and oriented.  In no distress.  Skin warm and dry. Eye:  Her right pupil is distorted and irregular, the corneal was dull and hazy. Fluorescein staining revealed linear areas of uptake over the entire cornea without any discrete ulceration or dendritic lesion. The patient did get relief of the pain after tetracaine was applied. Lids were normal. ENT:  TMs and canals were normal, without erythema or inflammation.  Nasal mucosa was clear and uncongested, without drainage.  Mucous membranes were moist.  Pharynx was clear with no exudate or drainage.  There were no oral ulcerations or lesions. Neck:  Supple, no adenopathy, tenderness or mass. Lungs:  No respiratory distress.  There are bilateral expiratory wheezes, no rales or rhonchi.  Heart:  Regular rhythm, without gallops, murmers or rubs. Skin:  Clear, warm, and dry, without rash or lesions.    Radiology   Dg Chest 2 View  01/03/2014   CLINICAL DATA:  Cough and wheezing.  EXAM: CHEST  2 VIEW  COMPARISON:  08/06/2013  FINDINGS: The heart size and mediastinal contours are within normal limits. Both lungs are clear. The visualized skeletal structures are unremarkable.  IMPRESSION: No active cardiopulmonary disease.   Electronically Signed   By: Earle Gell M.D.   On: 01/03/2014 16:20   Course in [facility] [place]   She was given a DuoNeb breathing treatment with improvement in her symptoms although she did continue to have some expiratory wheezes. I called Dr.  Brewington, confirm that she had a chronic eye problem. His recommendation was either a nucleation or corneal transplant. He's going to make an appointment for her to see a cornea specialist at a [facility]. She is to see him on Monday. He recommended pain medication the meantime. The patient was insistent that I give her an antibiotic drop, so she was given Polytrim.  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 A  diagnosis of Acute bronchitis was also pertinent to this visit.  Plan    1.  Meds:  The following meds were prescribed:   Discharge Medication List as of 01/03/2014  4:51 PM    START taking these medications   Details  albuterol (PROVENTIL HFA;VENTOLIN HFA) 108 (90 BASE) MCG/ACT inhaler Inhale 1-2 puffs into the lungs every 6 (six) hours as needed for wheezing or shortness of breath., Starting 01/03/2014, Until Discontinued, Normal    doxycycline (VIBRA-TABS) 100 MG tablet Take 1 tablet (100 mg total) by mouth 2 (two) times daily., Starting 01/03/2014, Until Discontinued, Normal    oxyCODONE-acetaminophen (PERCOCET) 5-325 MG per tablet 1 to 2 tablets every 6 hours as needed for pain., Print    trimethoprim-polymyxin b (POLYTRIM) ophthalmic solution Place 1 drop into both eyes every 4 (four) hours., Starting 01/03/2014, Until Discontinued, Normal        2.  Patient Education/Counseling:  The patient was given appropriate handouts, self care instructions, and instructed in symptomatic relief.  Instructed to get extra fluids, rest, and use a cool mist vaporizer.  She was strongly encouraged to quit smoking.  3.  Follow up:  The patient was told to follow up here if no better in 3 to 4 days, or sooner if becoming worse in any way, and given some red flag symptoms such as increasing fever, difficulty breathing, chest pain, or persistent vomiting which would prompt immediate return.  Follow up here as needed. Followup with Dr. Ricki Miller in his office on Monday.      Harden Mo, MD 01/03/14 1710  Addendum: The patient notified us that she was walking out that she cannot take Percocet because it causes her to itch all over. Therefore the above prescription was taken back and destroyed. In its place she will be given a prescription for tramadol 50 mg, #30, 2 every 8 hours as needed for pain.  Harden Mo, MD 01/03/14 [phone]

## 2014-01-03 NOTE — Discharge Instructions (Signed)
Take Delsym cough syrup 2 tsp every 12 hours for cough.    Bronchitis Bronchitis is inflammation of the airways that extend from the windpipe into the lungs (bronchi). The inflammation often causes mucus to develop, which leads to a cough. If the inflammation becomes severe, it may cause shortness of breath. CAUSES  Bronchitis may be caused by:   Viral infections.   Bacteria.   Cigarette smoke.   Allergens, pollutants, and other irritants.  SIGNS AND SYMPTOMS  The most common symptom of bronchitis is a frequent cough that produces mucus. Other symptoms include:  Fever.   Body aches.   Chest congestion.   Chills.   Shortness of breath.   Sore throat.  DIAGNOSIS  Bronchitis is usually diagnosed through a medical history and physical exam. Tests, such as chest X-rays, are sometimes done to rule out other conditions.  TREATMENT  You may need to avoid contact with whatever caused the problem (smoking, for example). Medicines are sometimes needed. These may include:  Antibiotics. These may be prescribed if the condition is caused by bacteria.  Cough suppressants. These may be prescribed for relief of cough symptoms.   Inhaled medicines. These may be prescribed to help open your airways and make it easier for you to breathe.   Steroid medicines. These may be prescribed for those with recurrent (chronic) bronchitis. HOME CARE INSTRUCTIONS  Get plenty of rest.   Drink enough fluids to keep your urine clear or pale yellow (unless you have a medical condition that requires fluid restriction). Increasing fluids may help thin your secretions and will prevent dehydration.   Only take over-the-counter or prescription medicines as directed by your health care provider.  Only take antibiotics as directed. Make sure you finish them even if you start to feel better.  Avoid secondhand smoke, irritating chemicals, and strong fumes. These will make bronchitis worse. If you  are a smoker, quit smoking. Consider using nicotine gum or skin patches to help control withdrawal symptoms. Quitting smoking will help your lungs heal faster.   Put a cool-mist humidifier in your bedroom at night to moisten the air. This may help loosen mucus. Change the water in the humidifier daily. You can also run the hot water in your shower and sit in the bathroom with the door closed for 5 10 minutes.   Follow up with your health care provider as directed.   Wash your hands frequently to avoid catching bronchitis again or spreading an infection to others.  SEEK MEDICAL CARE IF: Your symptoms do not improve after 1 week of treatment.  SEEK IMMEDIATE MEDICAL CARE IF:  Your fever increases.  You have chills.   You have chest pain.   You have worsening shortness of breath.   You have bloody sputum.  You faint.  You have lightheadedness.  You have a severe headache.   You vomit repeatedly. MAKE SURE YOU:   Understand these instructions.  Will watch your condition.  Will get help right away if you are not doing well or get worse. Document Released: 10/30/2005 Document Revised: 08/20/2013 Document Reviewed: 06/24/2013 [facility] Patient Information 2014 [place].

## 2014-10-23 ENCOUNTER — Emergency Department (HOSPITAL_COMMUNITY): Payer: Medicare Other

## 2014-10-23 ENCOUNTER — Encounter (HOSPITAL_COMMUNITY): Payer: Self-pay | Admitting: Emergency Medicine

## 2014-10-23 ENCOUNTER — Emergency Department (HOSPITAL_COMMUNITY)
Admission: EM | Admit: 2014-10-23 | Discharge: 2014-10-23 | Disposition: A | Discharge status: Home / Self Care | Payer: Medicare Other | Attending: Emergency Medicine | Admitting: Emergency Medicine

## 2014-10-23 DIAGNOSIS — Z792 Long term (current) use of antibiotics: Secondary | ICD-10-CM | POA: Insufficient documentation

## 2014-10-23 DIAGNOSIS — Y9289 Other specified places as the place of occurrence of the external cause: Secondary | ICD-10-CM | POA: Diagnosis not present

## 2014-10-23 DIAGNOSIS — I10 Essential (primary) hypertension: Secondary | ICD-10-CM | POA: Diagnosis not present

## 2014-10-23 DIAGNOSIS — Y93E3 Activity, vacuuming: Secondary | ICD-10-CM | POA: Insufficient documentation

## 2014-10-23 DIAGNOSIS — Y998 Other external cause status: Secondary | ICD-10-CM | POA: Insufficient documentation

## 2014-10-23 DIAGNOSIS — S52571A Other intraarticular fracture of lower end of right radius, initial encounter for closed fracture: Secondary | ICD-10-CM | POA: Insufficient documentation

## 2014-10-23 DIAGNOSIS — Z79899 Other long term (current) drug therapy: Secondary | ICD-10-CM | POA: Diagnosis not present

## 2014-10-23 DIAGNOSIS — S62101A Fracture of unspecified carpal bone, right wrist, initial encounter for closed fracture: Secondary | ICD-10-CM

## 2014-10-23 DIAGNOSIS — M25532 Pain in left wrist: Secondary | ICD-10-CM | POA: Diagnosis not present

## 2014-10-23 DIAGNOSIS — S0993XA Unspecified injury of face, initial encounter: Secondary | ICD-10-CM | POA: Insufficient documentation

## 2014-10-23 DIAGNOSIS — E119 Type 2 diabetes mellitus without complications: Secondary | ICD-10-CM | POA: Diagnosis not present

## 2014-10-23 DIAGNOSIS — S6991XA Unspecified injury of right wrist, hand and finger(s), initial encounter: Secondary | ICD-10-CM | POA: Diagnosis present

## 2014-10-23 DIAGNOSIS — Z720 Tobacco use: Secondary | ICD-10-CM | POA: Insufficient documentation

## 2014-10-23 DIAGNOSIS — H409 Unspecified glaucoma: Secondary | ICD-10-CM | POA: Insufficient documentation

## 2014-10-23 DIAGNOSIS — W01198A Fall on same level from slipping, tripping and stumbling with subsequent striking against other object, initial encounter: Secondary | ICD-10-CM | POA: Diagnosis not present

## 2014-10-23 HISTORY — DX: Unspecified glaucoma: H40.9

## 2014-10-23 MED ORDER — FLUORESCEIN SODIUM 1 MG OP STRP
1.0000 | ORAL_STRIP | Freq: Once | OPHTHALMIC | Status: AC
  Administered 2014-10-23: 1 via OPHTHALMIC
  Filled 2014-10-23: qty 1

## 2014-10-23 MED ORDER — TETRACAINE HCL 0.5 % OP SOLN
2.0000 [drp] | Freq: Once | OPHTHALMIC | Status: AC
  Administered 2014-10-23: 2 [drp] via OPHTHALMIC
  Filled 2014-10-23: qty 2

## 2014-10-23 MED ORDER — HYDROCODONE-ACETAMINOPHEN 5-325 MG PO TABS: 1.0000 | ORAL_TABLET | Freq: Four times a day (QID) | ORAL | Status: DC | PRN

## 2014-10-23 MED ORDER — HYDROCODONE-ACETAMINOPHEN 5-325 MG PO TABS
1.0000 | ORAL_TABLET | Freq: Once | ORAL | Status: AC
  Administered 2014-10-23: 1 via ORAL
  Filled 2014-10-23: qty 1

## 2014-10-23 NOTE — ED Provider Notes (Signed)
CSN: [id number]     Arrival date & time 10/23/14  6294 History   First MD Initiated Contact with Patient 10/23/14 1007     Chief Complaint  Patient presents with  . Wrist Injury     (Consider location/radiation/quality/duration/timing/severity/associated sxs/prior Treatment) The history is provided by the patient.  Katherine [name] is a 73 y.o. female hx of DM, HTN here with fall. Patient was vacuuming and slipped on the wet floor and then hit right face on the wall and landed on the right wrist. Denies syncope or loss of consciousness. Denies any headache or back pain or neck pain.    Past Medical History  Diagnosis Date  . Diabetes mellitus without complication   . Hypertension   . Glaucoma    Past Surgical History  Procedure Laterality Date  . Cholecystectomy     No family history on file. History  Substance Use Topics  . Smoking status: Current Every Day Smoker -- 0.50 packs/day    Types: Cigarettes  . Smokeless tobacco: Not on file  . Alcohol Use: 2.4 oz/week    4 Cans of beer per week   OB History    No data available     Review of Systems  HENT:       R facial pain   Musculoskeletal:       R wrist pain   All other systems reviewed and are negative.     Allergies  Oxycontin  Home Medications   Prior to Admission medications   Medication Sig Start Date End Date Taking? Authorizing Provider  albuterol (PROVENTIL HFA;VENTOLIN HFA) 108 (90 BASE) MCG/ACT inhaler Inhale 1-2 puffs into the lungs every 6 (six) hours as needed for wheezing or shortness of breath. 01/03/14  Yes Harden Mo, MD  ALPRAZolam Duanne Moron) 0.5 MG tablet Take 1 tablet by mouth daily as needed. For anxiety 08/10/14  Yes Historical Provider, MD  brimonidine-timolol (COMBIGAN) 0.2-0.5 % ophthalmic solution Place 1 drop into the right eye 2 (two) times daily as needed (swelling).   Yes Historical Provider, MD  glipiZIDE (GLUCOTROL) 5 MG tablet Take 5-10 mg by mouth 2 (two) times daily. Take two  tablets= 10mg  in the AM and 1 tablet = 5mg  at night 08/10/14  Yes Historical Provider, MD  Loteprednol Etabonate (LOTEMAX OP) Place 1 drop into the right eye daily as needed (swelling). 5 mins after the combigan   Yes Historical Provider, MD  pravastatin (PRAVACHOL) 10 MG tablet Take 1 tablet by mouth daily. 08/10/14  Yes Historical Provider, MD  quinapril (ACCUPRIL) 10 MG tablet Take 10 mg by mouth daily.   Yes Historical Provider, MD  doxycycline (VIBRA-TABS) 100 MG tablet Take 1 tablet (100 mg total) by mouth 2 (two) times daily. Patient not taking: Reported on 10/23/2014 01/03/14   Harden Mo, MD  HYDROcodone-acetaminophen (NORCO/VICODIN) 5-325 MG per tablet Take 1 tablet by mouth every 6 (six) hours as needed. Patient not taking: Reported on 10/23/2014 11/30/13   Carvel Getting, NP  LATANOPROST OP Apply to eye.    Historical Provider, MD  traMADol (ULTRAM) 50 MG tablet Take 2 tablets (100 mg total) by mouth every 8 (eight) hours as needed. Patient not taking: Reported on 10/23/2014 01/03/14   Harden Mo, MD  trimethoprim-polymyxin b [facility]) ophthalmic solution Place 1 drop into both eyes every 4 (four) hours. Patient not taking: Reported on 10/23/2014 01/03/14   Harden Mo, MD   BP 141/88 mmHg  Pulse 99  Temp(Src) 97.8  F (36.6 C) (Oral)  Resp 19  Ht 5\' 6"  (1.676 m)  Wt 120 lb (54.432 kg)  BMI 19.38 kg/m2  SpO2 99% Physical Exam  Constitutional: She is oriented to person, place, and time.  Uncomfortable   HENT:  Head: Normocephalic.  Tenderness R sinus area with no obvious bruise. Mild tenderness superior orbital area. EOMI. Erythema R conjunctiva. No hyphema. No obvious foreign bodies   Eyes: EOM are normal.  Neck: Normal range of motion. Neck supple.  Cardiovascular: Normal rate, regular rhythm and normal heart sounds.   Pulmonary/Chest: Effort normal and breath sounds normal. No respiratory distress. She has no wheezes. She has no rales.  Abdominal: Soft. Bowel  sounds are normal. She exhibits no distension. There is no tenderness. There is no rebound.  Musculoskeletal:  R wrist tenderness, unable to range wrist. 2+ pulses. Able to move fingers. Good capillary refill   Neurological: She is alert and oriented to person, place, and time. No cranial nerve deficit. Coordination normal.  Skin: Skin is warm and dry.  Psychiatric: She has a normal mood and affect. Her behavior is normal. Judgment and thought content normal.  Nursing note and vitals reviewed.   ED Course  Procedures (including critical care time) Labs Review Labs Reviewed - No data to display  Imaging Review Dg Facial Bones Complete  10/23/2014   CLINICAL DATA:  73 year old female with right facial pain swelling and bruising. Fell on wet floor recently. Right eye and cheek contusion. Initial encounter.  EXAM: FACIAL BONES COMPLETE 3+V  COMPARISON:  Head CT without contrast 08/30/2012.  FINDINGS: Paranasal sinuses appear stable and normally pneumatized. Bone mineralization is within normal limits. No abnormal gas identified in the orbits. Mastoid air cell pneumatization appears symmetric. No mandible or facial bone fracture identified. Chronic appearing nasal bone fracture.  IMPRESSION: No facial bone fracture identified. If symptoms persist, non contrast face CT would be more sensitive.   Electronically Signed   By: Lars Pinks M.D.   On: 10/23/2014 11:11   Dg Wrist Complete Right  10/23/2014   CLINICAL DATA:  [place] last week and injured right wrist. Persistent pain and swelling.  EXAM: RIGHT WRIST - COMPLETE 3+ VIEW  COMPARISON:  None.  FINDINGS: Complex comminuted intra-articular fracture of the distal radius with mild dorsal impaction. The radioulnar joint is intact. No ulnar fracture. The carpal bones are intact.  IMPRESSION: Complex comminuted intra-articular fracture of the distal radius with dorsal impaction.   Electronically Signed   By: Kalman Jewels M.D.   On: 10/23/2014 11:08   Ct  Wrist Right Wo Contrast  10/23/2014   CLINICAL DATA:  Fall last week. Distal radius fracture. Subsequent encounter.  EXAM: CT OF THE RIGHT WRIST WITHOUT CONTRAST  TECHNIQUE: Multidetector CT imaging of the right wrist was performed according to the standard protocol. Multiplanar CT image reconstructions were also generated.  COMPARISON:  10/23/2014.  FINDINGS: Comminuted mildly displaced distal radius fracture is present. Loss of the normal volar tilt of the distal radius. The dorsal lip of the radius is displaced 4 mm dorsally. Dorsal tilt of the lunate is present. Widening of the scapholunate interval is compatible with a scapholunate ligament tear. The scaphoid bone is intact. The distal radius fracture extends into the radio carpal articular surface and distal radial ulnar joint. No tendinous entrapment is present. Distal ulna is intact.  IMPRESSION: 1. Comminuted mildly displaced distal radius fracture with intra-articular extension. 2. Scapholunate ligament tear with ligamentous DISI.   Electronically Signed  By: Dereck Ligas M.D.   On: 10/23/2014 14:21     EKG Interpretation None      MDM   Final diagnoses:  Wrist injury, right, initial encounter  Facial injury, initial encounter  Wrist fracture, right, closed, initial encounter   Katherine [name] is a 73 y.o. female here with mechanical fall. Will get xrays. Not syncope. Not on blood thinners and no headache or neck pain so will not need CT head or neck.   2:27 PM  I numbed and stain her R eye, no foreign body or corneal abrasions. Xray showed distal radius fracture. Called Dr. Caralyn Guile, who recomment volar splint and CT and he will see patient in a week. Stable for d/c.     Wandra Arthurs, MD 10/23/14 [phone]

## 2014-10-23 NOTE — ED Notes (Signed)
Pt cannot recall medication allergic to ? Hydro -itching

## 2014-10-23 NOTE — ED Notes (Signed)
Dr Darl Householder at bedside performing eye exam

## 2014-10-23 NOTE — ED Notes (Signed)
MD at bedside. EDP YAO PRESENT TO EVALUATE THIS PT 

## 2014-10-23 NOTE — ED Notes (Signed)
Called ortho tech top place splint

## 2014-10-23 NOTE — ED Notes (Signed)
Pt states was vacuuming and slipped on wet floor resulting in fall onto rt side. Pt hit lower wall resulting in right facial swelling with contusion to right eye and right cheek. Tenderness to right side of face. Pt also c/o of right forearm and wrist pain and swelling. EDP Yap present and updated on pt's current status. No LOC. Denies neck and back pain

## 2014-10-23 NOTE — ED Notes (Signed)
Patient transported to X-ray 

## 2014-10-23 NOTE — ED Notes (Signed)
Pt states that she fell last week and slipped on plastic runner when washing rugs and fell. Pt has swelling to right wrist pt able to make a fist but having pain.

## 2014-10-23 NOTE — Discharge Instructions (Signed)
Take vicodin for severe pain.   Keep splint in place at all times.   See Dr. Caralyn Guile in a week.   Return to ER if you have severe pain, vomiting, eye pain.

## 2015-04-28 ENCOUNTER — Other Ambulatory Visit: Payer: Self-pay | Admitting: Family Medicine

## 2015-04-28 ENCOUNTER — Ambulatory Visit
Admission: RE | Admit: 2015-04-28 | Discharge: 2015-04-28 | Disposition: A | Discharge status: Home / Self Care | Payer: Medicare Other | Source: Ambulatory Visit | Attending: Family Medicine | Admitting: Family Medicine

## 2015-04-28 DIAGNOSIS — F17200 Nicotine dependence, unspecified, uncomplicated: Secondary | ICD-10-CM

## 2015-05-13 ENCOUNTER — Ambulatory Visit (INDEPENDENT_AMBULATORY_CARE_PROVIDER_SITE_OTHER): Payer: Medicare Other | Admitting: Podiatry

## 2015-05-13 ENCOUNTER — Ambulatory Visit (INDEPENDENT_AMBULATORY_CARE_PROVIDER_SITE_OTHER): Payer: Medicare Other

## 2015-05-13 ENCOUNTER — Encounter: Payer: Self-pay | Admitting: Podiatry

## 2015-05-13 VITALS — BP 114/65 | HR 81 | Resp 16

## 2015-05-13 DIAGNOSIS — M79671 Pain in right foot: Secondary | ICD-10-CM

## 2015-05-13 DIAGNOSIS — M779 Enthesopathy, unspecified: Secondary | ICD-10-CM

## 2015-05-13 DIAGNOSIS — M2011 Hallux valgus (acquired), right foot: Secondary | ICD-10-CM

## 2015-05-13 MED ORDER — MELOXICAM 15 MG PO TABS: 15.0000 mg | ORAL_TABLET | Freq: Every day | ORAL | Status: DC

## 2015-05-13 NOTE — Progress Notes (Signed)
   Subjective:    Patient ID: Katherine [name], female    DOB: 24-Oct-1941, 74 y.o.   MRN: [id number]  HPI Comments: "I have pain in the ball of my foot"  Patient c/o aching plantar forefoot right for a couple months. Worse the more she walks. Tried padding it, but no help.  Diabetic x 10 years last A1c unknown     Review of Systems  Eyes: Positive for pain and visual disturbance.  Musculoskeletal: Positive for arthralgias and gait problem.  All other systems reviewed and are negative.      Objective:   Physical Exam: I have reviewed her past mental history medications allergy surgery social history and review of systems area pulses are strongly palpable. Neurologic sensorium appears to be intact bilateral. Deep tendon reflexes are brisk and equal bilateral muscle strength to the right lower extremity +5 over 5 dorsiflexion plantarflexion and inversion everters all into the musculature is intact. Orthopedic evaluation demonstrates hallux abductovalgus deformity pain on palpation and range of motion of the second metatarsophalangeal joint as well as at the insertion site of the plantar fascia.          Assessment & Plan:  Assessment: Plantar fasciitis capsulitis of the forefoot second MTPJ and first MTPJ.  Plan: Injected these areas with dexamethasone and Kenalog today prescribed meloxicam and I will follow-up with her in 1 month.

## 2015-06-10 ENCOUNTER — Ambulatory Visit: Payer: Medicare Other | Admitting: Podiatry

## 2016-03-24 ENCOUNTER — Encounter (HOSPITAL_BASED_OUTPATIENT_CLINIC_OR_DEPARTMENT_OTHER): Payer: Self-pay | Admitting: *Deleted

## 2016-03-24 NOTE — Progress Notes (Addendum)
NPO AFTER MN.  ARRIVE AT 0915.  NEEDS ISTAT AND EKG.    REVEIVED FAX FROM OFFICE W/ CURRENT EKG AND LAB RESULTS NOT CURRENT.  WILL NEED ONLY ISTAT ON ARRIVAL.

## 2016-03-27 NOTE — H&P (Signed)
Katherine [name] is an 75 y.o. female.   Chief Complaint: of exotropia,diplopia,blurred vision aphakia of right eye,acquired ptosis of right eye. HPI: Pt with exotropia,blurred vision,double vision,aphakia of the right eye.  Past Medical History  Diagnosis Date  . Hypertension   . Glaucoma   . Type 2 diabetes mellitus ([place])   . Incomplete right bundle branch block   . LAFB (left anterior fascicular block)   . Exotropia of right eye     AND HYPERTROPIA  . Hyperlipidemia   . SUI (stress urinary incontinence, female)   . Wears glasses   . Wears dentures     upper denture and lower partial denture    Past Surgical History  Procedure Laterality Date  . Glaucoma surgery Right 08-23-2015  at [facility]  . Cholecystectomy open  1968  . Eye surgery  age 12    repair right eye injury    History reviewed. No pertinent family history. Social History:  reports that she quit smoking about 3 months ago. Her smoking use included Cigarettes. She has a 2.5 pack-year smoking history. She has never used smokeless tobacco. She reports that she does not drink alcohol or use illicit drugs.  Allergies: No Known Allergies  No prescriptions prior to admission    No results found for this or any previous visit (from the past 48 hour(s)). No results found.  Review of Systems  Eyes: Positive for blurred vision and double vision.       *Exotropia *Acquired ptosis of  Right eye *Apakic right eye *Glaucoma    Height 5\' 6"  (1.676 m), weight 57.607 kg (127 lb). Physical Exam  Eyes:       Assessment/Plan Pt schedule for inferior oblique myectomy,right lateral rectus recession,right medial rectus resection with adjustable sutures.  Dara Hoyer, MD 03/27/2016, 9:53 AM

## 2016-03-29 ENCOUNTER — Encounter (HOSPITAL_BASED_OUTPATIENT_CLINIC_OR_DEPARTMENT_OTHER)
Admission: RE | Disposition: A | Discharge status: Home / Self Care | Payer: Self-pay | Source: Ambulatory Visit | Attending: Ophthalmology

## 2016-03-29 ENCOUNTER — Ambulatory Visit (HOSPITAL_BASED_OUTPATIENT_CLINIC_OR_DEPARTMENT_OTHER): Payer: Medicare Other | Admitting: Anesthesiology

## 2016-03-29 ENCOUNTER — Encounter (HOSPITAL_BASED_OUTPATIENT_CLINIC_OR_DEPARTMENT_OTHER): Payer: Self-pay | Admitting: Anesthesiology

## 2016-03-29 ENCOUNTER — Ambulatory Visit (HOSPITAL_BASED_OUTPATIENT_CLINIC_OR_DEPARTMENT_OTHER)
Admission: RE | Admit: 2016-03-29 | Discharge: 2016-03-29 | Disposition: A | Discharge status: Home / Self Care | Payer: Medicare Other | Source: Ambulatory Visit | Attending: Ophthalmology | Admitting: Ophthalmology

## 2016-03-29 DIAGNOSIS — I10 Essential (primary) hypertension: Secondary | ICD-10-CM | POA: Insufficient documentation

## 2016-03-29 DIAGNOSIS — H02401 Unspecified ptosis of right eyelid: Secondary | ICD-10-CM | POA: Insufficient documentation

## 2016-03-29 DIAGNOSIS — H538 Other visual disturbances: Secondary | ICD-10-CM | POA: Insufficient documentation

## 2016-03-29 DIAGNOSIS — H532 Diplopia: Secondary | ICD-10-CM | POA: Insufficient documentation

## 2016-03-29 DIAGNOSIS — Z87891 Personal history of nicotine dependence: Secondary | ICD-10-CM | POA: Insufficient documentation

## 2016-03-29 DIAGNOSIS — E119 Type 2 diabetes mellitus without complications: Secondary | ICD-10-CM | POA: Diagnosis not present

## 2016-03-29 DIAGNOSIS — H5010 Unspecified exotropia: Secondary | ICD-10-CM | POA: Diagnosis not present

## 2016-03-29 DIAGNOSIS — H2701 Aphakia, right eye: Secondary | ICD-10-CM | POA: Insufficient documentation

## 2016-03-29 HISTORY — PX: ADJUSTABLE SUTURE MANIPULATION: SHX5290

## 2016-03-29 HISTORY — DX: Type 2 diabetes mellitus without complications: E11.9

## 2016-03-29 HISTORY — PX: MEDIAN RECTUS REPAIR: SHX5301

## 2016-03-29 HISTORY — DX: Unspecified right bundle-branch block: I45.10

## 2016-03-29 HISTORY — DX: Presence of spectacles and contact lenses: Z97.3

## 2016-03-29 HISTORY — DX: Presence of dental prosthetic device (complete) (partial): Z97.2

## 2016-03-29 HISTORY — DX: Monocular exotropia, right eye: H50.111

## 2016-03-29 HISTORY — DX: Unspecified exotropia: H50.10

## 2016-03-29 HISTORY — DX: Hyperlipidemia, unspecified: E78.5

## 2016-03-29 HISTORY — DX: Left anterior fascicular block: I44.4

## 2016-03-29 HISTORY — DX: Stress incontinence (female) (male): N39.3

## 2016-03-29 LAB — GLUCOSE, CAPILLARY: GLUCOSE-CAPILLARY: 192 mg/dL — AB (ref 65–99)

## 2016-03-29 LAB — POCT I-STAT, CHEM 8
BUN: 13 mg/dL (ref 6–20)
Calcium, Ion: 1.28 mmol/L (ref 1.13–1.30)
Chloride: 103 mmol/L (ref 101–111)
Creatinine, Ser: 0.6 mg/dL (ref 0.44–1.00)
Glucose, Bld: 154 mg/dL — ABNORMAL HIGH (ref 65–99)
HEMATOCRIT: 40 % (ref 36.0–46.0)
HEMOGLOBIN: 13.6 g/dL (ref 12.0–15.0)
POTASSIUM: 3.5 mmol/L (ref 3.5–5.1)
Sodium: 144 mmol/L (ref 135–145)
TCO2: 27 mmol/L (ref 0–100)

## 2016-03-29 SURGERY — ADJUSTABLE SUTURE MANIPULATION
Anesthesia: Topical | Site: Eye | Laterality: Right

## 2016-03-29 SURGERY — REPAIR, MUSCLE, MEDIAL RECTUS
Anesthesia: General | Site: Eye | Laterality: Right

## 2016-03-29 MED ORDER — LABETALOL HCL 5 MG/ML IV SOLN
INTRAVENOUS | Status: DC | PRN
  Administered 2016-03-29: 5 mg via INTRAVENOUS

## 2016-03-29 MED ORDER — PROPOFOL 10 MG/ML IV BOLUS
INTRAVENOUS | Status: AC
  Filled 2016-03-29: qty 20

## 2016-03-29 MED ORDER — BSS IO SOLN
INTRAOCULAR | Status: DC | PRN
  Administered 2016-03-29: 30 mL via INTRAOCULAR

## 2016-03-29 MED ORDER — TOBRAMYCIN-DEXAMETHASONE 0.3-0.1 % OP OINT
TOPICAL_OINTMENT | OPHTHALMIC | Status: DC | PRN
  Administered 2016-03-29: 1 via OPHTHALMIC

## 2016-03-29 MED ORDER — LACTATED RINGERS IV SOLN
INTRAVENOUS | Status: DC
  Administered 2016-03-29 (×2): via INTRAVENOUS
  Filled 2016-03-29: qty 1000

## 2016-03-29 MED ORDER — SCOPOLAMINE 1 MG/3DAYS TD PT72
MEDICATED_PATCH | TRANSDERMAL | Status: AC
  Filled 2016-03-29: qty 1

## 2016-03-29 MED ORDER — ONDANSETRON HCL 4 MG/2ML IJ SOLN
INTRAMUSCULAR | Status: DC | PRN
  Administered 2016-03-29: 4 mg via INTRAVENOUS

## 2016-03-29 MED ORDER — HYDROMORPHONE HCL 1 MG/ML IJ SOLN
0.2500 mg | INTRAMUSCULAR | Status: DC | PRN
  Administered 2016-03-29 (×2): 0.25 mg via INTRAVENOUS
  Filled 2016-03-29: qty 1

## 2016-03-29 MED ORDER — PROPOFOL 10 MG/ML IV BOLUS
INTRAVENOUS | Status: DC | PRN
  Administered 2016-03-29: 130 mg via INTRAVENOUS

## 2016-03-29 MED ORDER — FENTANYL CITRATE (PF) 100 MCG/2ML IJ SOLN
INTRAMUSCULAR | Status: AC
  Filled 2016-03-29: qty 2

## 2016-03-29 MED ORDER — FENTANYL CITRATE (PF) 100 MCG/2ML IJ SOLN
INTRAMUSCULAR | Status: DC | PRN
  Administered 2016-03-29 (×4): 25 ug via INTRAVENOUS

## 2016-03-29 MED ORDER — ONDANSETRON HCL 4 MG/2ML IJ SOLN
INTRAMUSCULAR | Status: AC
  Filled 2016-03-29: qty 2

## 2016-03-29 MED ORDER — MIDAZOLAM HCL 5 MG/5ML IJ SOLN
INTRAMUSCULAR | Status: DC | PRN
  Administered 2016-03-29: 0.5 mg via INTRAVENOUS

## 2016-03-29 MED ORDER — HYDROMORPHONE HCL 1 MG/ML IJ SOLN
INTRAMUSCULAR | Status: AC
  Filled 2016-03-29: qty 1

## 2016-03-29 MED ORDER — KETOROLAC TROMETHAMINE 30 MG/ML IJ SOLN
INTRAMUSCULAR | Status: AC
  Filled 2016-03-29: qty 1

## 2016-03-29 MED ORDER — PROMETHAZINE HCL 25 MG/ML IJ SOLN
6.2500 mg | INTRAMUSCULAR | Status: DC | PRN
  Filled 2016-03-29: qty 1

## 2016-03-29 MED ORDER — TOBRAMYCIN-DEXAMETHASONE 0.3-0.1 % OP OINT
1.0000 | TOPICAL_OINTMENT | Freq: Two times a day (BID) | OPHTHALMIC | Status: DC
Start: 2016-03-29 — End: 2017-02-20

## 2016-03-29 MED ORDER — SCOPOLAMINE 1 MG/3DAYS TD PT72
1.0000 | MEDICATED_PATCH | TRANSDERMAL | Status: DC
  Administered 2016-03-29: 1.5 mg via TRANSDERMAL
  Filled 2016-03-29: qty 1

## 2016-03-29 MED ORDER — PHENYLEPHRINE HCL 2.5 % OP SOLN
OPHTHALMIC | Status: DC | PRN
  Administered 2016-03-29: 3 [drp] via OPHTHALMIC

## 2016-03-29 MED ORDER — MIDAZOLAM HCL 2 MG/2ML IJ SOLN
INTRAMUSCULAR | Status: AC
  Filled 2016-03-29: qty 2

## 2016-03-29 MED ORDER — ACETAMINOPHEN-CODEINE #3 300-30 MG PO TABS: 1.0000 | ORAL_TABLET | Freq: Three times a day (TID) | ORAL | Status: DC | PRN

## 2016-03-29 MED ORDER — LIDOCAINE HCL (CARDIAC) 20 MG/ML IV SOLN
INTRAVENOUS | Status: DC | PRN
  Administered 2016-03-29: 80 mg via INTRAVENOUS

## 2016-03-29 MED ORDER — KETOROLAC TROMETHAMINE 30 MG/ML IJ SOLN
INTRAMUSCULAR | Status: DC | PRN
  Administered 2016-03-29: 15 mg via INTRAVENOUS

## 2016-03-29 MED ORDER — TETRACAINE HCL 0.5 % OP SOLN
OPHTHALMIC | Status: DC | PRN
  Administered 2016-03-29: 4 [drp] via OPHTHALMIC

## 2016-03-29 MED ORDER — LIDOCAINE HCL (CARDIAC) 20 MG/ML IV SOLN
INTRAVENOUS | Status: AC
  Filled 2016-03-29: qty 5

## 2016-03-29 MED ORDER — EPHEDRINE SULFATE 50 MG/ML IJ SOLN
INTRAMUSCULAR | Status: AC
  Filled 2016-03-29: qty 1

## 2016-03-29 MED ORDER — DEXAMETHASONE SODIUM PHOSPHATE 4 MG/ML IJ SOLN
INTRAMUSCULAR | Status: DC | PRN
  Administered 2016-03-29: 5 mg via INTRAVENOUS

## 2016-03-29 MED ORDER — DEXAMETHASONE SODIUM PHOSPHATE 10 MG/ML IJ SOLN
INTRAMUSCULAR | Status: AC
  Filled 2016-03-29: qty 1

## 2016-03-29 SURGICAL SUPPLY — 29 items
APL SRG 3 HI ABS STRL LF PLS (MISCELLANEOUS) ×2
APPLICATOR DR MATTHEWS STRL (MISCELLANEOUS) ×6 IMPLANT
BANDAGE EYE OVAL (MISCELLANEOUS) IMPLANT
CAUTERY EYE LOW TEMP 1300F FIN (OPHTHALMIC RELATED) ×3 IMPLANT
CLOSURE WOUND 1/2 X4 (GAUZE/BANDAGES/DRESSINGS) ×1
COVER BACK TABLE 60X90IN (DRAPES) ×3 IMPLANT
COVER MAYO STAND STRL (DRAPES) ×3 IMPLANT
DRAPE LG THREE QUARTER DISP (DRAPES) ×3 IMPLANT
DRAPE SURG 17X23 STRL (DRAPES) ×9 IMPLANT
GLOVE BIO SURGEON STRL SZ 6.5 (GLOVE) ×2 IMPLANT
GLOVE BIO SURGEONS STRL SZ 6.5 (GLOVE) ×1
GLOVE ECLIPSE 8.0 STRL XLNG CF (GLOVE) ×3 IMPLANT
GLOVE INDICATOR 6.5 STRL GRN (GLOVE) ×3 IMPLANT
GLOVE SURG SIGNA 7.5 PF LTX (GLOVE) ×3 IMPLANT
GOWN STRL REUS W/ TWL LRG LVL3 (GOWN DISPOSABLE) ×2 IMPLANT
GOWN STRL REUS W/TWL LRG LVL3 (GOWN DISPOSABLE) ×6
KIT ROOM TURNOVER WOR (KITS) ×3 IMPLANT
MARKER PEN SURG W/LABELS BLK (STERILIZATION PRODUCTS) ×3 IMPLANT
PACK BASIN DAY SURGERY FS (CUSTOM PROCEDURE TRAY) ×3 IMPLANT
SPEAR EYE SURGICAL ST (MISCELLANEOUS) IMPLANT
STRIP CLOSURE SKIN 1/2X4 (GAUZE/BANDAGES/DRESSINGS) ×2 IMPLANT
SUT VICRYL 6 0 S 29 12 (SUTURE) ×9 IMPLANT
SUT VICRYL 7 0 TG140 8 (SUTURE) IMPLANT
SUT VICRYL 8 0 TG140 8 (SUTURE) ×3 IMPLANT
TOWEL OR 17X24 6PK STRL BLUE (TOWEL DISPOSABLE) ×6 IMPLANT
TRAY DSU PREP LF (CUSTOM PROCEDURE TRAY) ×3 IMPLANT
TUBE CONNECTING 12'X1/4 (SUCTIONS)
TUBE CONNECTING 12X1/4 (SUCTIONS) IMPLANT
WATER STERILE IRR 500ML POUR (IV SOLUTION) ×3 IMPLANT

## 2016-03-29 SURGICAL SUPPLY — 14 items
APL SRG 3 HI ABS STRL LF PLS (MISCELLANEOUS) ×1
APPLICATOR DR MATTHEWS STRL (MISCELLANEOUS) ×3 IMPLANT
CONT SPECI 4OZ STER CLIK (MISCELLANEOUS) ×3 IMPLANT
COVER MAYO STAND STRL (DRAPES) ×3 IMPLANT
GAUZE SPONGE 4X4 16PLY XRAY LF (GAUZE/BANDAGES/DRESSINGS) ×3 IMPLANT
GLOVE LITE  25/BX (GLOVE) ×3 IMPLANT
GLOVE SURG SIGNA 7.5 PF LTX (GLOVE) ×3 IMPLANT
PACK ICE SM (MISCELLANEOUS) ×3 IMPLANT
PAD ALCOHOL SWAB (MISCELLANEOUS) ×3 IMPLANT
SUT VICRYL 8 0 TG140 8 (SUTURE) IMPLANT
SYR 3ML 23GX1 SAFETY (SYRINGE) ×3 IMPLANT
TAPE SURG TRANSPORE 1 IN (GAUZE/BANDAGES/DRESSINGS) ×1 IMPLANT
TAPE SURGICAL TRANSPORE 1 IN (GAUZE/BANDAGES/DRESSINGS) ×2
TOWEL OR 17X24 6PK STRL BLUE (TOWEL DISPOSABLE) ×3 IMPLANT

## 2016-03-29 NOTE — Interval H&P Note (Signed)
History and Physical Interval Note:  03/29/2016 10:08 AM  Katherine [name]  has presented today for surgery, with the diagnosis of EXOTROPIA HYPERTROPIA   The various methods of treatment have been discussed with the patient and family. After consideration of risks, benefits and other options for treatment, the patient has consented to  Procedure(s): MEDIAN RECTUS RESECTION RIGHT EYE ADJUSTABLE SUTURE RIGHT EYE LATERAL RECTUS RESECTION RIGHT EYE  (Right) and R inferior Oblique recession  as a surgical intervention .  The patient's history has been reviewed, patient examined, no change in status, stable for surgery.  I have reviewed the patient's chart and labs.  Questions were answered to the patient's satisfaction.     [name]

## 2016-03-29 NOTE — Anesthesia Preprocedure Evaluation (Signed)
Anesthesia Evaluation  Patient identified by MRN, date of birth, ID band Patient awake    Reviewed: Allergy & Precautions, NPO status , Patient's Chart, lab work & pertinent test results  Airway Mallampati: II  TM Distance: >3 FB Neck ROM: Full    Dental  (+) Upper Dentures, Partial Lower, Dental Advisory Given   Pulmonary former smoker,    Pulmonary exam normal        Cardiovascular hypertension, Pt. on medications negative cardio ROS Normal cardiovascular exam     Neuro/Psych negative neurological ROS  negative psych ROS   GI/Hepatic negative GI ROS, Neg liver ROS,   Endo/Other  diabetes  Renal/GU negative Renal ROS  negative genitourinary   Musculoskeletal negative musculoskeletal ROS (+)   Abdominal   Peds negative pediatric ROS (+)  Hematology negative hematology ROS (+)   Anesthesia Other Findings   Reproductive/Obstetrics negative OB ROS                             Anesthesia Physical Anesthesia Plan  ASA: III  Anesthesia Plan: General   Post-op Pain Management:    Induction: Intravenous  Airway Management Planned: Oral ETT  Additional Equipment:   Intra-op Plan:   Post-operative Plan: Extubation in OR  Informed Consent: I have reviewed the patients History and Physical, chart, labs and discussed the procedure including the risks, benefits and alternatives for the proposed anesthesia with the patient or authorized representative who has indicated his/her understanding and acceptance.   Dental advisory given  Plan Discussed with: Anesthesiologist  Anesthesia Plan Comments:         Anesthesia Quick Evaluation

## 2016-03-29 NOTE — Anesthesia Procedure Notes (Addendum)
Procedure Name: LMA Insertion Date/Time: 03/29/2016 11:18 AM Performed by: Mechele Claude Pre-anesthesia Checklist: Patient identified, Emergency Drugs available, Suction available and Patient being monitored Patient Re-evaluated:Patient Re-evaluated prior to inductionOxygen Delivery Method: Circle System Utilized Preoxygenation: Pre-oxygenation with 100% oxygen Intubation Type: IV induction Ventilation: Mask ventilation without difficulty LMA: LMA flexible inserted LMA Size: 4.0 Number of attempts: 1 Airway Equipment and Method: Bite block Placement Confirmation: positive ETCO2 Tube secured with: Tape Dental Injury: Teeth and Oropharynx as per pre-operative assessment

## 2016-03-29 NOTE — Anesthesia Postprocedure Evaluation (Signed)
Anesthesia Post Note  Patient: Katherine [name]  Procedure(s) Performed: Procedure(s) (LRB): MEDIAN RECTUS RESECTION RIGHT EYE ADJUSTABLE SUTURE RIGHT EYE LATERAL RECTUS RESECTION RIGHT EYE  (Right)  Patient location during evaluation: PACU Anesthesia Type: General Level of consciousness: sedated Pain management: pain level controlled Vital Signs Assessment: post-procedure vital signs reviewed and stable Respiratory status: spontaneous breathing and respiratory function stable Cardiovascular status: stable Anesthetic complications: no    Last Vitals:  Filed Vitals:   03/29/16 1400 03/29/16 1415  BP: 122/53 130/49  Pulse: 87 89  Temp:    Resp: 19 18    Last Pain:  Filed Vitals:   03/29/16 1417  PainSc: 3                  Elizer Bostic DANIEL

## 2016-03-29 NOTE — Progress Notes (Signed)
Returned to OR for second phase

## 2016-03-29 NOTE — Discharge Instructions (Signed)
°  Post Anesthesia Home Care Instructions  Activity: Get plenty of rest for the remainder of the day. A responsible adult should stay with you for 24 hours following the procedure.  For the next 24 hours, DO NOT: -Drive a car -Paediatric nurse -Drink alcoholic beverages -Take any medication unless instructed by your physician -Make any legal decisions or sign important papers.  Meals: Start with liquid foods such as gelatin or soup. Progress to regular foods as tolerated. Avoid greasy, spicy, heavy foods. If nausea and/or vomiting occur, drink only clear liquids until the nausea and/or vomiting subsides. Call your physician if vomiting continues.  Special Instructions/Symptoms: Your throat may feel dry or sore from the anesthesia or the breathing tube placed in your throat during surgery. If this causes discomfort, gargle with warm salt water. The discomfort should disappear within 24 hours.  If you had a scopolamine patch placed behind your ear for the management of post- operative nausea and/or vomiting:  1. The medication in the patch is effective for 72 hours, after which it should be removed.  Wrap patch in a tissue and discard in the trash. Wash hands thoroughly with soap and water. 2. You may remove the patch earlier than 72 hours if you experience unpleasant side effects which may include dry mouth, dizziness or visual disturbances. 3. Avoid touching the patch. Wash your hands with soap and water after contact with the patch.   Call your surgeon if you experience:   1.  Fever over 101.0. 2.  Inability to urinate. 3.  Nausea and/or vomiting. 4.  Extreme swelling or bruising at the surgical site. 5.  Continued bleeding from the incision. 6.  Increased pain, redness or drainage from the incision. 7.  Problems related to your pain medication. 8.  Any problems and/or concerns 9. Call surgeon for any visual changes. 10. Any questions, call your surgeon.

## 2016-03-29 NOTE — Transfer of Care (Signed)
Last Vitals:  Filed Vitals:   03/29/16 0909  BP: 143/53  Pulse: 87  Temp: 37 C  Resp: 16    Last Pain: There were no vitals filed for this visit.    Patients Stated Pain Goal: 5 (03/29/16 0935)  Immediate Anesthesia Transfer of Care Note  Patient: Katherine [name]  Procedure(s) Performed: Procedure(s) (LRB): MEDIAN RECTUS RESECTION RIGHT EYE ADJUSTABLE SUTURE RIGHT EYE LATERAL RECTUS RESECTION RIGHT EYE  (Right)  Patient Location: PACU  Anesthesia Type: General  Level of Consciousness: awake, alert  and oriented  Airway & Oxygen Therapy: Patient Spontanous Breathing and Patient connected to nasal cannula oxygen  Post-op Assessment: Report given to PACU RN and Post -op Vital signs reviewed and stable  Post vital signs: Reviewed and stable  Complications: No apparent anesthesia complications

## 2016-03-30 ENCOUNTER — Encounter (HOSPITAL_BASED_OUTPATIENT_CLINIC_OR_DEPARTMENT_OTHER): Payer: Self-pay | Admitting: Ophthalmology

## 2016-03-30 NOTE — Op Note (Signed)
NAME:  Katherine [name]              ACCOUNT NO.:  [id number]  MEDICAL RECORD NO.:  [id number]  LOCATION:                                 FACILITY:  PHYSICIAN:  Venia Carbon. 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  PROCEDURE:  Right inferior oblique myectomy of 10 mm.  A right lateral rectus recession of 10 mm on a hang-back technique.  Right medial rectus resect advance of 6 mm by plication procedure on adjustable sutures.  SURGEON:  Venia Carbon. Frederico Hamman, M.D.  ANESTHESIA:  General with laryngeal mask airway.  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 This procedure is indicated to restore the alignment of the visual axis to resolve diplopia and restore alignment of the visual axis.  The risks and benefits of the procedure were explained to the patient prior to procedure.  Informed consent was obtained.  DESCRIPTION OF TECHNIQUE:  The patient was taken into the operating room and placed in supine position. The entire face was prepped and draped in usual sterile fashion after induction by general anesthesia and establishment of laryngeal mask airway.  My attention was first directed to the right eye.  A lid speculum was placed.  Forced duction tests were performed and found to be slightly restricted.  The globe was then held In the inferior nasal quadrant.  The eye was elevated and abducted.  It was found that there was significant exuberant cicatricial scar tissue, which had to be  negotiated.  A deep forniceal incision  allowed [place] to bypass most of the cicatrix and to isolate the right medial rectus tendon, which had been previously recessed at 5 mm from its native insertion.  The tendon was then carefully isolated on a Stevens hook,and subsequently two green hooks.  A mark was then placed on the tendon at approximately 4 mm  and the tendon was then imbricated on 6-0 Vicryl suture taking two locking bites At the medial and temporal apices.   The tendon was then advanced to its native insertion site 5.5 mm from the limbus, thereby completing an equivalent of a 6-mm resection of the right medial rectus tendon.  The tendon sutures were then placed,and tied securely in adjustable suture fashion for later adjustment. My attention was then directed to the contralateral right lateral rectus.  The globe was held in the inferior temporal quadrant.  There was significant scar tissue surrounding the insertion site of the lateral rectus  and this was bypassed by taking a deep forniceal incision into the posterior subtenon spaces and isolating the right lateral rectus tendon, which had been previously resected.  It was carefully dissected free from its overlying muscle fascia and intermuscular septae. Thetendon was then imbricated on 6-0 Vicryl suture taking two locking bites at  medial and temporal apices.  It was then dissected free from the globe and my attention was directed to the ipsilateral inferior oblique muscle, it was isolated on two Stevens hooks, brought into the incisions and dissection site and placed on two Green hooks and then a 10-mm myectomy was performed.  Hemostasis was achieved with thermal cautery.  I redirected my attention then to the right lateral rectus tendon.  It was previously imbricated on 6-0 Vicryl suture.  It was then reattached to the globe at the native insertion site approximately 6.9 mm from the limbus and the tendon was allowed to hang back  approximately 10 mm from its previously reattached position at 6.9 mm, creating a 10-mm recession, the suture was tied securely.  The conjunctiva was then repositioned.  An 8-0 Vicryl suture was used to proximally reattach the conjunctiva, which had lost its normal anatomic flexibility due to cicatricial scarring.  At the conclusion of the procedure, a double-pressure patch was applied.  The patient was then taken from the operating room to the recovery room, allowed to awaken for approximately 20 minutes, returned to the operating room and adjusted to straight eyes in the operative suite.  After the adjustment, TobraDex ointment was applied to the right eye.  There were no apparent complications.     Legrand Como A. Frederico Hamman, M.D.     MAS/MEDQ  D:  03/29/2016  T:  03/30/2016  Job:  [id number]

## 2016-09-27 ENCOUNTER — Other Ambulatory Visit: Payer: Self-pay | Admitting: Specialist

## 2016-09-27 DIAGNOSIS — R5381 Other malaise: Secondary | ICD-10-CM

## 2016-09-27 DIAGNOSIS — Z1231 Encounter for screening mammogram for malignant neoplasm of breast: Secondary | ICD-10-CM

## 2016-10-31 ENCOUNTER — Other Ambulatory Visit: Payer: Self-pay

## 2016-11-17 ENCOUNTER — Other Ambulatory Visit: Payer: Self-pay | Admitting: Specialist

## 2016-11-17 DIAGNOSIS — M8580 Other specified disorders of bone density and structure, unspecified site: Secondary | ICD-10-CM

## 2016-11-20 ENCOUNTER — Ambulatory Visit (INDEPENDENT_AMBULATORY_CARE_PROVIDER_SITE_OTHER): Payer: Medicare Other | Admitting: Diagnostic Neuroimaging

## 2016-11-20 ENCOUNTER — Encounter: Payer: Self-pay | Admitting: Diagnostic Neuroimaging

## 2016-11-20 VITALS — Ht 66.0 in | Wt 131.8 lb

## 2016-11-20 DIAGNOSIS — R42 Dizziness and giddiness: Secondary | ICD-10-CM | POA: Diagnosis not present

## 2016-11-20 DIAGNOSIS — R258 Other abnormal involuntary movements: Secondary | ICD-10-CM

## 2016-11-20 DIAGNOSIS — I639 Cerebral infarction, unspecified: Secondary | ICD-10-CM | POA: Diagnosis not present

## 2016-11-20 NOTE — Progress Notes (Signed)
[facility]  PATIENT: Katherine [name] DOB: [birth year]  REFERRING CLINICIAN: P[place]ock HISTORY FROM: patient  REASON FOR VISIT: new consult    HISTORICAL  CHIEF COMPLAINT:  Chief Complaint  Patient presents with  . Dizziness    rm 7, New Pt, "dizziness for several months with change of positions"    HISTORY OF PRESENT ILLNESS:   76 year old right-handed female here for evaluation of dizziness. For past few months (at least since June or July 2017) patient notes a "dizziness" sensation which she describes as feeling off balance, swimmy headed, movement in her head. Symptoms fairly stable over past few months. She denies room spinning sensation, nausea or vomiting. No positional trigger. Symptoms worse when she standing and walking. She does not feel this when she is sitting up, laying down or turning her head around.  Patient has noted generalized slowing, fatigue, slow movements for almost 1 year. She also has significant depression, anxiety, poor sleep, decreased energy, disinterest in activities and stress at home.  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   REVIEW OF SYSTEMS: Full 14 system review of systems performed and negative with exception of: Weakness, dizziness.  ALLERGIES: No Known Allergies  HOME MEDICATIONS: Outpatient Medications Prior to Visit  Medication Sig Dispense Refill  . acetaminophen-codeine (TYLENOL #3) 300-30 MG tablet Take 1 tablet by mouth every 8 (eight) hours as needed for moderate pain. 30 tablet 0  . ALPRAZolam (XANAX) 0.5 MG tablet Take 1 tablet by mouth daily as needed. For anxiety    . Calcium Carbonate-Vitamin D (CALCIUM 600+D) 600-400 MG-UNIT tablet Take 2 tablets by mouth daily.    Marland Kitchen glipiZIDE (GLUCOTROL) 5 MG tablet Take 5-10 mg by mouth 2 (two) times daily. Take two tablets= 10mg  in the AM and 1 tablet = 5mg  at night      . lisinopril (PRINIVIL,ZESTRIL) 10 MG tablet Take 10 mg by mouth every morning.     . Multiple Vitamin (MULTIVITAMIN) tablet Take 1 tablet by mouth daily.    . pravastatin (PRAVACHOL) 10 MG tablet Take 1 tablet by mouth every morning.     . tobramycin-dexamethasone (TOBRADEX) ophthalmic ointment Place 1 application into the right eye 2 (two) times daily. 3.5 g 0  . timolol (BETIMOL) 0.5 % ophthalmic solution Place 1 drop into the right eye 2 (two) times daily.     No facility-administered medications prior to visit.     PAST MEDICAL HISTORY: Past Medical History:  Diagnosis Date  . Exotropia of right eye    AND HYPERTROPIA  . Glaucoma   . Hyperlipidemia   . Hypertension   . Incomplete right bundle branch block   . LAFB (left anterior fascicular block)   . SUI (stress urinary incontinence, female)   . Type 2 diabetes mellitus ([place])   . Wears dentures    upper denture and lower partial denture  . Wears glasses     PAST SURGICAL HISTORY: Past Surgical History:  Procedure Laterality Date  . ADJUSTABLE SUTURE MANIPULATION Right 03/29/2016   Procedure: ADJUSTABLE SUTURE RIGHT EYE LATERAL RECTUS RESECTION RIGHT EYE ;  Surgeon: Gevena Cotton, MD;  Location: [facility];  Service: Ophthalmology;  Laterality: Right;  . CHOLECYSTECTOMY OPEN  1968  . EYE SURGERY  age 16   repair right eye injury  . GLAUCOMA SURGERY Right 08-23-2015  at [facility]  . MEDIAN RECTUS REPAIR Right 03/29/2016   Procedure:  MEDIAN RECTUS RESECTION RIGHT EYE ADJUSTABLE SUTURE RIGHT EYE LATERAL RECTUS RESECTION RIGHT EYE ;  Surgeon: Gevena Cotton, MD;  Location[facility];  Service: Ophthalmology;  Laterality: Right;    FAMILY HISTORY: Family History  Problem Relation Age of Onset  . Heart disease Father     SOCIAL HISTORY:  Social History   Social History  . Marital status: Married    Spouse name: Stephanie Coup  . Number of children: 3  . Years of education: 30   Occupational  History  .      NA   Social History Main Topics  . Smoking status: Former Smoker    Packs/day: 0.50    Years: 5.00    Types: Cigarettes    Quit date: 12/25/2012  . Smokeless tobacco: Never Used  . Alcohol use No  . Drug use: No  . Sexual activity: Not on file   Other Topics Concern  . Not on file   Social History Narrative   Lives at home with husband   No caffeine     PHYSICAL EXAM  GENERAL EXAM/CONSTITUTIONAL: Vitals:  Vitals:   11/20/16 1411  Weight: 131 lb 12.8 oz (59.8 kg)  Height: 5\' 6"  (1.676 m)     Body mass index is 21.27 kg/m.  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    Patient is in no distress; well developed, nourished and groomed; neck is supple  MASKED FACIES; DECREASED BLINKING  HOARSE VOICE  CARDIOVASCULAR:  Examination of carotid arteries is normal; no carotid bruits  Regular rate and rhythm, no murmurs  Examination of peripheral vascular system by observation and palpation is normal  EYES:  RIGHT EYE POST-SURG; LIGHT PERCEPTION ONLY  LEFT EYE CATARACT; ophthalmoscopic exam of optic disc and posterior segment is normal; no papilledema or hemorrhages  MUSCULOSKELETAL:  Gait, strength, tone, movements noted in Neurologic exam below  NEUROLOGIC: MENTAL STATUS:  No flowsheet data found.  awake, alert, oriented to person, place and time  recent and remote memory intact  normal attention and concentration  language fluent, comprehension intact, naming intact,   fund of knowledge appropriate  CRANIAL NERVE:   2nd - no papilledema on fundoscopic exam; RIGHT EYE POST-SURGICAL  2nd, 3rd, 4th, 6th - RIGHT PUPIL NO REACTION POST-OP; LEFT EYE 3-->2 reactive to light, LEFT EYE visual fields full to confrontation, extraocular muscles --> SACCADIC DYSMETRIA; no nystagmus  5th - facial sensation symmetric  7th - facial  strength symmetric  8th - hearing intact  9th - palate elevates symmetrically, uvula midline  11th - shoulder shrug symmetric  12th - tongue protrusion midline  HOARSE VOICE  MOTOR:   normal bulk  INCREASED TONE IN RUE  BRADYKINESIA IN BUE; MOUTH TREMOR NOTED WITH HAND RAPID ALTERNATING MOVEMENTS  full strength in the BUE, BLE; EXCEPT RIGHT WRIST LIMITED BY PAIN  SENSORY:   normal and symmetric to light touch, pinprick, temperature, vibration  COORDINATION:   finger-nose-finger, fine finger movements SLOW  REFLEXES:   deep tendon reflexes TRACE and symmetric  POSITIVE SNOUT AND MYERSONS REFLEXES; NEG PALMOMENTAL  GAIT/STATION:   narrow based gait; SHORT STEPS; STOOPED POSTURE; DECR ARM SWING; EN BLOC TURNING     DIAGNOSTIC DATA (LABS, IMAGING, TESTING) - I reviewed patient records, labs, notes, testing and imaging myself where available.  Lab Results  Component Value Date  WBC 9.5 06/25/2012   HGB 13.6 03/29/2016   HCT 40.0 03/29/2016   MCV 89.6 06/25/2012   PLT 220 06/25/2012      Component Value Date/Time   NA 144 03/29/2016 1015   K 3.5 03/29/2016 1015   CL 103 03/29/2016 1015   CO2 27 06/25/2012 0959   GLUCOSE 154 (H) 03/29/2016 1015   BUN 13 03/29/2016 1015   CREATININE 0.60 03/29/2016 1015   CALCIUM 10.0 06/25/2012 0959   PROT 7.6 06/25/2012 0959   ALBUMIN 4.5 06/25/2012 0959   AST 13 06/25/2012 0959   ALT 17 06/25/2012 0959   ALKPHOS 75 06/25/2012 0959   BILITOT 0.3 06/25/2012 0959   GFRNONAA 87 (L) 06/25/2012 0959   GFRAA >90 06/25/2012 0959   Lab Results  Component Value Date   CHOL 172 06/25/2012   HDL 101 06/25/2012   LDLCALC 46 06/25/2012   TRIG 125 06/25/2012   CHOLHDL 1.7 06/25/2012   Lab Results  Component Value Date   HGBA1C 6.5 (H) 06/25/2012   No results found for: VITAMINB12 Lab Results  Component Value Date   TSH 0.585 06/25/2012    05/26/16 MRI BRAIN [report only] - chronic small vessel ischemic disease  with lacunar infarcts in left thalamus and right cerebellum    ASSESSMENT AND PLAN  76 y.o. year old female here with intermittent dizziness, balance difficulty, slow movements, masked facies, over past 6-12 months. 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 Dizziness and giddiness   2. Cerebellar stroke ([place])   3. Bradykinesia      PLAN: - stroke prevention (aspirin, diabetes tx, lipid mgmt, BP control) - PT evaluation - use cane or walker - monitor parkinsonism; may consider carb/levo at next visit - consider therapist/counselor to help with stress / depression  Orders Placed This Encounter  Procedures  . For home use only DME 4 wheeled rolling walker with seat  . Ambulatory referral to Physical Therapy   Return in about 3 months (around 02/18/2017).    Penni Bombard, MD [id number], 0000000 PM Certified in Neurology, Neurophysiology and Neuroimaging  [facility] [address], [place], [place] [address] [phone]

## 2016-11-20 NOTE — Patient Instructions (Signed)
-   I will setup physical therapy  - I will give you prescription for rollator walker

## 2016-12-05 ENCOUNTER — Ambulatory Visit
Admission: RE | Admit: 2016-12-05 | Discharge: 2016-12-05 | Disposition: A | Discharge status: Home / Self Care | Payer: Medicare Other | Source: Ambulatory Visit | Attending: Specialist | Admitting: Specialist

## 2016-12-05 DIAGNOSIS — M8580 Other specified disorders of bone density and structure, unspecified site: Secondary | ICD-10-CM

## 2016-12-05 DIAGNOSIS — Z1231 Encounter for screening mammogram for malignant neoplasm of breast: Secondary | ICD-10-CM

## 2017-02-20 ENCOUNTER — Ambulatory Visit (INDEPENDENT_AMBULATORY_CARE_PROVIDER_SITE_OTHER): Payer: Medicare Other | Admitting: Diagnostic Neuroimaging

## 2017-02-20 ENCOUNTER — Encounter (INDEPENDENT_AMBULATORY_CARE_PROVIDER_SITE_OTHER): Payer: Self-pay

## 2017-02-20 ENCOUNTER — Encounter: Payer: Self-pay | Admitting: Diagnostic Neuroimaging

## 2017-02-20 VITALS — BP 138/66 | HR 88 | Ht 66.0 in | Wt 137.8 lb

## 2017-02-20 DIAGNOSIS — R42 Dizziness and giddiness: Secondary | ICD-10-CM | POA: Diagnosis not present

## 2017-02-20 DIAGNOSIS — G20C Parkinsonism, unspecified: Secondary | ICD-10-CM

## 2017-02-20 DIAGNOSIS — G20 Parkinson's disease: Secondary | ICD-10-CM | POA: Diagnosis not present

## 2017-02-20 DIAGNOSIS — I639 Cerebral infarction, unspecified: Secondary | ICD-10-CM | POA: Diagnosis not present

## 2017-02-20 MED ORDER — CARBIDOPA-LEVODOPA 25-100 MG PO TABS: 1.0000 | ORAL_TABLET | Freq: Three times a day (TID) | ORAL | 6 refills | Status: DC

## 2017-02-20 NOTE — Progress Notes (Signed)
[facility]  PATIENT: Katherine [name] DOB: [birth year]  REFERRING CLINICIAN: P[place]ock HISTORY FROM: patient and husband REASON FOR VISIT: follow up   HISTORICAL  CHIEF COMPLAINT:  Chief Complaint  Patient presents with  . Follow-up  . Dizziness    still continues with dizziness.,   Pt alone.  PT not done, seems confused.     Marland Kitchen Hx Stroke    HISTORY OF PRESENT ILLNESS:   UPDATE 02/20/17: Since last visit symptoms are stable. Has not had PT yet. Here with husband. Asking about meds, PT, and treatment options. No major falls. Some minor trips.   PRIOR HPI (11/20/16): 76 year old right-handed female here for evaluation of dizziness. For past few months (at least since June or July 2017) patient notes a "dizziness" sensation which she describes as feeling off balance, swimmy headed, movement in her head. Symptoms fairly stable over past few months. She denies room spinning sensation, nausea or vomiting. No positional trigger. Symptoms worse when she standing and walking. She does not feel this when she is sitting up, laying down or turning her head around. Patient has noted generalized slowing, fatigue, slow movements for almost 1 year. She also has significant depression, anxiety, poor sleep, decreased energy, disinterest in activities and stress at home. 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   REVIEW OF SYSTEMS: Full 14 system review of systems performed and negative with exception of: dizziness agitation depression anxiety fatigue   ALLERGIES: No Known Allergies  HOME MEDICATIONS: Outpatient Medications Prior to Visit  Medication Sig Dispense Refill  . ALPRAZolam (XANAX) 0.5 MG tablet Take 1 tablet by mouth daily as needed. For anxiety    . aspirin EC 81 MG tablet Take 81 mg by mouth daily.    Marland Kitchen glipiZIDE (GLUCOTROL) 5 MG tablet Take 5-10 mg by mouth 2 (two)  times daily. Take two tablets= 10mg  in the AM and 1 tablet = 5mg  at night    . lisinopril (PRINIVIL,ZESTRIL) 10 MG tablet Take 10 mg by mouth every morning.     . Multiple Vitamin (MULTIVITAMIN) tablet Take 1 tablet by mouth daily.    . pravastatin (PRAVACHOL) 10 MG tablet Take 1 tablet by mouth every morning.     Marland Kitchen acetaminophen-codeine (TYLENOL #3) 300-30 MG tablet Take 1 tablet by mouth every 8 (eight) hours as needed for moderate pain. (Patient not taking: Reported on 02/20/2017) 30 tablet 0  . azithromycin (ZITHROMAX) 250 MG tablet 250 mg.    . benzonatate (TESSALON) 100 MG capsule As needed    . Calcium Carbonate-Vitamin D (CALCIUM 600+D) 600-400 MG-UNIT tablet Take 2 tablets by mouth daily.    Marland Kitchen tobramycin-dexamethasone (TOBRADEX) ophthalmic ointment Place 1 application into the right eye 2 (two) times daily. 3.5 g 0   No facility-administered medications prior to visit.     PAST MEDICAL HISTORY: Past Medical History:  Diagnosis Date  . Exotropia of right eye    AND HYPERTROPIA  . Glaucoma   . Hyperlipidemia   . Hypertension   . Incomplete right bundle branch block   . LAFB (left anterior fascicular block)   . SUI (stress urinary incontinence, female)   . Type 2 diabetes mellitus ([place])   . Wears dentures    upper denture and lower partial denture  . Wears glasses     PAST SURGICAL HISTORY: Past Surgical History:  Procedure Laterality Date  . ADJUSTABLE SUTURE MANIPULATION Right  03/29/2016   Procedure: ADJUSTABLE SUTURE RIGHT EYE LATERAL RECTUS RESECTION RIGHT EYE ;  Surgeon: Gevena Cotton, MD;  Location: [facility];  Service: Ophthalmology;  Laterality: Right;  . CHOLECYSTECTOMY OPEN  1968  . EYE SURGERY  age 7   repair right eye injury  . GLAUCOMA SURGERY Right 08-23-2015  at [facility]  . MEDIAN RECTUS REPAIR Right 03/29/2016   Procedure: MEDIAN RECTUS RESECTION RIGHT EYE ADJUSTABLE SUTURE RIGHT EYE LATERAL RECTUS RESECTION RIGHT EYE ;  Surgeon: Gevena Cotton, MD;  Location: [facility];  Service: Ophthalmology;  Laterality: Right;    FAMILY HISTORY: Family History  Problem Relation Age of Onset  . Heart disease Father     SOCIAL HISTORY:  Social History   Social History  . Marital status: Married    Spouse name: Stephanie Coup  . Number of children: 3  . Years of education: 77   Occupational History  .      NA   Social History Main Topics  . Smoking status: Former Smoker    Packs/day: 0.50    Years: 5.00    Types: Cigarettes    Quit date: 12/25/2012  . Smokeless tobacco: Never Used  . Alcohol use No  . Drug use: No  . Sexual activity: Not on file   Other Topics Concern  . Not on file   Social History Narrative   Lives at home with husband   No caffeine     PHYSICAL EXAM  GENERAL EXAM/CONSTITUTIONAL: Vitals:  Vitals:   02/20/17 1322  BP: 138/66  Pulse: 88  Weight: 137 lb 12.8 oz (62.5 kg)  Height: 5\' 6"  (1.676 m)   Wt Readings from Last 3 Encounters:  02/20/17 137 lb 12.8 oz (62.5 kg)  11/20/16 131 lb 12.8 oz (59.8 kg)  03/29/16 126 lb 8 oz (57.4 kg)   Body mass index is 22.24 kg/m. No exam data present  Patient is in no distress; well developed, nourished and groomed; neck is supple  MASKED FACIES; DECREASED BLINKING  HOARSE VOICE  CARDIOVASCULAR:  Examination of carotid arteries is normal; no carotid bruits  Regular rate and rhythm, no murmurs  Examination of peripheral vascular system by observation and palpation is normal  EYES:  RIGHT EYE POST-SURG; LIGHT PERCEPTION ONLY  LEFT EYE CATARACT; ophthalmoscopic exam of optic disc and posterior segment is normal; no papilledema or hemorrhages  MUSCULOSKELETAL:  Gait, strength, tone, movements noted in Neurologic exam below  NEUROLOGIC: MENTAL STATUS:  No flowsheet data found.  awake, alert, oriented to person, place and time  recent and remote memory intact  normal attention and concentration  language fluent,  comprehension intact, naming intact,   fund of knowledge appropriate  CRANIAL NERVE:   2nd - no papilledema on fundoscopic exam; RIGHT EYE POST-SURGICAL  2nd, 3rd, 4th, 6th - RIGHT PUPIL NO REACTION POST-OP; LEFT EYE 3-->2 reactive to light, LEFT EYE visual fields full to confrontation, extraocular muscles --> SACCADIC DYSMETRIA; no nystagmus  5th - facial sensation symmetric  7th - facial strength symmetric  8th - hearing intact  9th - palate elevates symmetrically, uvula midline  11th - shoulder shrug symmetric  12th - tongue protrusion midline  HOARSE VOICE  MOTOR:   normal bulk  INCREASED TONE IN RUE  BRADYKINESIA IN BUE; MOUTH TREMOR NOTED WITH HAND RAPID ALTERNATING MOVEMENTS  full strength in the BUE, BLE; EXCEPT RIGHT WRIST LIMITED BY PAIN  SENSORY:   normal and symmetric to light touch, pinprick, temperature, vibration  COORDINATION:   finger-nose-finger, fine finger movements SLOW  REFLEXES:   deep tendon reflexes TRACE and symmetric  POSITIVE SNOUT AND MYERSONS REFLEXES; NEG PALMOMENTAL  GAIT/STATION:   narrow based gait; SHORT STEPS; STOOPED POSTURE; DECR ARM SWING; SLIGHT EN BLOC TURNING     DIAGNOSTIC DATA (LABS, IMAGING, TESTING) - I reviewed patient records, labs, notes, testing and imaging myself where available.  Lab Results  Component Value Date   WBC 9.5 06/25/2012   HGB 13.6 03/29/2016   HCT 40.0 03/29/2016   MCV 89.6 06/25/2012   PLT 220 06/25/2012      Component Value Date/Time   NA 144 03/29/2016 1015   K 3.5 03/29/2016 1015   CL 103 03/29/2016 1015   CO2 27 06/25/2012 0959   GLUCOSE 154 (H) 03/29/2016 1015   BUN 13 03/29/2016 1015   CREATININE 0.60 03/29/2016 1015   CALCIUM 10.0 06/25/2012 0959   PROT 7.6 06/25/2012 0959   ALBUMIN 4.5 06/25/2012 0959   AST 13 06/25/2012 0959   ALT 17 06/25/2012 0959   ALKPHOS 75 06/25/2012 0959   BILITOT 0.3 06/25/2012 0959   GFRNONAA 87 (L) 06/25/2012 0959   GFRAA >90  06/25/2012 0959   Lab Results  Component Value Date   CHOL 172 06/25/2012   HDL 101 06/25/2012   LDLCALC 46 06/25/2012   TRIG 125 06/25/2012   CHOLHDL 1.7 06/25/2012   Lab Results  Component Value Date   HGBA1C 6.5 (H) 06/25/2012   No results found for: VITAMINB12 Lab Results  Component Value Date   TSH 0.585 06/25/2012    05/26/16 MRI BRAIN [report only] - chronic small vessel ischemic disease with lacunar infarcts in left thalamus and right cerebellum    ASSESSMENT AND PLAN  76 y.o. year old female here with intermittent dizziness, balance difficulty, slow movements, masked facies, over past 6-12 months. 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 Parkinsonism, unspecified Parkinsonism type ([place])   2. Dizziness and giddiness   3. Cerebellar stroke ([facility])      PLAN:  - reviewed parkinsonism and treatment options --> will try carb/levo trial  - follow up PT evaluation  - stroke prevention (aspirin, diabetes tx, lipid mgmt, BP control)  - use cane or walker if balance feels off  - consider therapist/counselor to help with stress / depression  Meds ordered this encounter  Medications  . carbidopa-levodopa (SINEMET IR) 25-100 MG tablet    Sig: Take 1 tablet by mouth 3 (three) times daily before meals.    Dispense:  90 tablet    Refill:  6   Return in about 3 months (around 05/22/2017).    Penni Bombard, MD 4/58/0998, 3:38 PM Certified in Neurology, Neurophysiology and Neuroimaging  [facility] [address] [place], [place] [address] [phone]

## 2017-02-20 NOTE — Patient Instructions (Signed)
-   start carbidopa / levodopa--> half tab three times per day with meals; after 1-2 weeks, increase to full tab three times per day  - will setup physical therapy

## 2017-03-06 ENCOUNTER — Ambulatory Visit: Payer: Medicare Other | Attending: Diagnostic Neuroimaging

## 2017-03-06 ENCOUNTER — Telehealth: Payer: Self-pay

## 2017-03-06 ENCOUNTER — Telehealth: Payer: Self-pay | Admitting: Diagnostic Neuroimaging

## 2017-03-06 DIAGNOSIS — R2689 Other abnormalities of gait and mobility: Secondary | ICD-10-CM | POA: Diagnosis present

## 2017-03-06 DIAGNOSIS — R42 Dizziness and giddiness: Secondary | ICD-10-CM | POA: Insufficient documentation

## 2017-03-06 NOTE — Telephone Encounter (Signed)
Patient requesting call back regarding medication change. Best call back is [phone]

## 2017-03-06 NOTE — Therapy (Addendum)
[facility] [address] [place], [place], [address] Phone: [phone]   Fax:  [phone]  Physical Therapy Evaluation  Patient Details  Name: Katherine [name] MRN: [id number] Date of Birth: 15-Dec-1940 Referring Provider: Dr. Leta Baptist  Encounter Date: 03/06/2017      PT End of Session - 03/06/17 1638    Visit Number 1   Number of Visits 9   Date for PT Re-Evaluation 04/05/17   Authorization Type G-CODE AND PROGRESS NOTE EVERY 10TH VISIT.    PT Start Time 1448   PT Stop Time 1542   PT Time Calculation (min) 54 min   Activity Tolerance Patient tolerated treatment well   Behavior During Therapy WFL for tasks assessed/performed      Past Medical History:  Diagnosis Date  . Exotropia of right eye    AND HYPERTROPIA  . Glaucoma   . Hyperlipidemia   . Hypertension   . Incomplete right bundle branch block   . LAFB (left anterior fascicular block)   . SUI (stress urinary incontinence, female)   . Type 2 diabetes mellitus ([place])   . Wears dentures    upper denture and lower partial denture  . Wears glasses     Past Surgical History:  Procedure Laterality Date  . ADJUSTABLE SUTURE MANIPULATION Right 03/29/2016   Procedure: ADJUSTABLE SUTURE RIGHT EYE LATERAL RECTUS RESECTION RIGHT EYE ;  Surgeon: Gevena Cotton, MD;  Location: [facility];  Service: Ophthalmology;  Laterality: Right;  . CHOLECYSTECTOMY OPEN  1968  . EYE SURGERY  age 82   repair right eye injury  . GLAUCOMA SURGERY Right 08-23-2015  at [facility]  . MEDIAN RECTUS REPAIR Right 03/29/2016   Procedure: MEDIAN RECTUS RESECTION RIGHT EYE ADJUSTABLE SUTURE RIGHT EYE LATERAL RECTUS RESECTION RIGHT EYE ;  Surgeon: Gevena Cotton, MD;  Location: [facility];  Service: Ophthalmology;  Laterality: Right;    There were no vitals filed for this visit.       Subjective Assessment - 03/06/17 1500    Subjective Pt with hx of  cerebellar stroke and parkinsonism. Pt reported dizziness began 08/2016, after R eye operation (exotropia and eyelid drooping). She states dizziness is worse when going from sitting to standing and meclizine did not help. Pt states she believes Sinimet incr. dizziness. She occasionally feels dizziness when walking, she states her head feels heavy/cloudy. Pt states dizziness lasts all day and is constant. Pt reported one fall when walking down stairs (had slippers on).  Pt states she has an order for a walker but has not ordered it yet.    Pertinent History Parkinsonism, cerebellar stroke, R eye legally blind, exotropia and hypertropia R eye (surgery in 2017 to correct), HLD, HTN, R BBB, LAFB, SUI, DM, hx of glaucoma   Patient Stated Goals Stop my dizziness   Currently in Pain? No/denies            [facility] PT Assessment - 03/06/17 1503      Assessment   Medical Diagnosis Dizziness and giddiness, cerebellar stroke, bradykinesia   Referring Provider Dr. Leta Baptist   Onset Date/Surgical Date 08/13/16   Hand Dominance Right   Prior Therapy none for dizziness or cerebellar stroke     Precautions   Precautions Fall     Restrictions   Weight Bearing Restrictions No     Balance Screen   Has the patient fallen in the past 6 months Yes   How many times? 1   Has the  patient had a decrease in activity level because of a fear of falling?  Yes   Is the patient reluctant to leave their home because of a fear of falling?  No     Home Environment   Living Environment Private residence   Living Arrangements Spouse/significant other   Available Help at Discharge Family   Type of [place] to enter   Entrance Stairs-Number of Steps 4   Entrance Stairs-Rails None   Home Layout One level   [place] bars - tub/shower     Prior Function   Level of Independence Independent   Vocation Retired   Manufacturing systems engineer, playing cards, spending time with friends     Cognition    Overall Cognitive Status Impaired/Different from baseline  per pt      Sensation   Additional Comments Pt denied N/T.     Ambulation/Gait   Ambulation/Gait Yes   Ambulation/Gait Assistance 5: Supervision   Ambulation/Gait Assistance Details Pt amb. in guarded manner.   Ambulation Distance (Feet) 100 Feet   Assistive device None   Gait Pattern Step-through pattern;Decreased arm swing - left;Decreased arm swing - right;Decreased trunk rotation;Narrow base of support   Ambulation Surface Level;Indoor   Gait velocity 2.41ft/sec.  no AD            Vestibular Assessment - 03/06/17 1517      Symptom Behavior   Type of Dizziness "Funny feeling in head"   Frequency of Dizziness Daily   Duration of Dizziness All day   Aggravating Factors Supine to sit;Sit to stand   Relieving Factors Rest, relaxing, laughing, talking     Occulomotor Exam   Occulomotor Alignment Normal   Spontaneous Absent   Gaze-induced Absent   Smooth Pursuits Intact   Saccades Intact   Comment No incr. in dizziness reported.     Vestibulo-Occular Reflex   VOR 1 Head Only (x 1 viewing) WNL     Orthostatics   BP supine (x 5 minutes) 109/58   HR supine (x 5 minutes) 85   BP sitting 91/48  incr. dizziness   HR sitting 86   BP standing (after 1 minute) 94/50  incr. dizziness   HR standing (after 1 minute) 87                       PT Education - 03/06/17 1637    Education provided Yes   Education Details PT discussed exam findings, PT POC and frequency/duration. PT educated pt on orthostatic hypotension and informed pt that PT would send information to MD. Pt asked about Sinimet and Lisinopril, as pt ceased Sinimet; PT encouraged pt to notify MD and to take medications as prescribed by MD.    Terence Lux) Educated Patient   Methods Explanation   Comprehension Verbalized understanding          PT Short Term Goals - 03/06/17 1646      PT SHORT TERM GOAL #1   Title same as LTGs            PT Long Term Goals - 03/06/17 1646      PT LONG TERM GOAL #1   Title Pt will be IND in HEP to improve dizziness and balance. TARGET DATE FOR ALL LTGS: 04/03/17   Status New     PT LONG TERM GOAL #2   Title Perform FGA and write goal.   Status New     PT LONG  TERM GOAL #3   Title Pt will report dizziness </=3/10 during amb. in order to perform ADLs and functional mobility safely.    Status New     PT LONG TERM GOAL #4   Title Pt will amb. 1000' over even/uneven terrain, IND, without incr. in dizziness to improve functional mobility.    Status New               Plan - 03/06/17 1638    Clinical Impression Statement Pt is a 76y/o female presenting to OPPT neuro for dizziness, cerebellar stroke, and bradykinesia. Pt's PMH significant for the following: Parkinsonism, R eye legally blind, exotropia and hypertropia R eye (surgery in 2017 to correct), HLD, HTN, R BBB, LAFB, SUI, DM, hx of glaucoma. During vestibular assessment pt's occulomotor exam was WNL, however, pt reported incr. dizziness during orthostatic hypotension testing. Pt experienced significant decr. in BP (notably distolic >/=83MOQH) during supine to sit indicating pt likely has orthostatic hypotension. Pt also very guarded during amb., especially while turning, as pt is fearful dizziness will incr. Dizziness is likley multi-factorial and related to orthostatics and cerebellar stroke. PT did not perform positional testing as pt's symptoms not likely BPPV but PT will monitor. PT will formally assess balance with FGA next session. The following impairments were found during exam: dizziness, gait deviations, impaired balance, strength not formally assessed but weakness suspected 2/2 gait deviations. Pt would benefit from skilled PT to improve safety during functional mobility.    Rehab Potential Good   Clinical Impairments Affecting Rehab Potential see PMH   PT Frequency 2x / week   PT Duration 4 weeks   PT  Treatment/Interventions ADLs/Self Care Home Management;Biofeedback;Gait training;DME Instruction;Canalith Repostioning;Neuromuscular re-education;Balance training;Therapeutic exercise;Manual techniques;Therapeutic activities;Functional mobility training;Orthotic Fit/Training;Patient/family education;Stair training;Vestibular   PT Next Visit Plan Perform FGA and write goal, provide balance HEP (and habituation HEP prn).    Consulted and Agree with Plan of Care Patient      Patient will benefit from skilled therapeutic intervention in order to improve the following deficits and impairments:  Abnormal gait, Decreased balance, Dizziness, Decreased mobility, Decreased safety awareness, Decreased knowledge of use of DME, Decreased strength  Visit Diagnosis: Dizziness and giddiness - Plan: PT plan of care cert/re-cert  Other abnormalities of gait and mobility - Plan: PT plan of care cert/re-cert      G-Codes - 47/65/46 1648    Functional Assessment Tool Used (Outpatient Only) 6-7/10 dizziness at worst and 5/10 dizziness at best   Functional Limitation Changing and maintaining body position   Changing and Maintaining Body Position Current Status (T0354) At least 40 percent but less than 60 percent impaired, limited or restricted   Changing and Maintaining Body Position Goal Status (S5681) At least 1 percent but less than 20 percent impaired, limited or restricted       Problem List There are no active problems to display for this patient.   Miller,Jennifer L 03/06/2017, 5:19 PM  [facility] [address] [place], [place], [address] Phone: [phone]   Fax:  [phone]  Name: Stevee Valenta MRN: [id number] Date of Birth: [birth year]   Geoffry Paradise, PT,DPT 03/06/17 5:19 PM Phone: [phone] Fax: [phone]

## 2017-03-06 NOTE — Telephone Encounter (Signed)
Dr. Leta Baptist  I saw Katherine [name] today for a PT eval. Her BP decreased significantly during orthostatic hypotension testing and she experienced increased dizziness. She also mentioned she ceased taking Sinimet as it made her feeling "funny". I strongly encouraged her to inform you and to take all medication as directed by her MD.  Thank you, Geoffry Paradise, PT,DPT 03/06/17 4:55 PM Phone: [phone] Fax: [phone]

## 2017-03-06 NOTE — Telephone Encounter (Signed)
Spoke to pt and since she has been on the sinemet, 02/21/17  taking 1/2 tablet tid to 1 tab tid, she has noted continued dizziness and no change in her tremors.  She is asking for something different. Please advise.

## 2017-03-07 NOTE — Telephone Encounter (Signed)
Spoke to pt relayed that she is to stop the sinemet.  Record her Bp lying and standing several times daily and do this for a week.  Write the results down and bring to the office.  Will show to Dr. Leta Baptist.  Pt verbalized understanding and appreciation of call.

## 2017-03-07 NOTE — Telephone Encounter (Signed)
Have patient stop carbidopa/levodopa and record blood pressures for 1 week, and bring log to office. Check BP in laying and standing positions several times per day. -VRP

## 2017-03-13 ENCOUNTER — Ambulatory Visit: Payer: Medicare Other

## 2017-03-14 ENCOUNTER — Ambulatory Visit: Payer: Medicare Other

## 2017-03-20 ENCOUNTER — Ambulatory Visit: Payer: Medicare Other | Attending: Diagnostic Neuroimaging | Admitting: Physical Therapy

## 2017-03-20 VITALS — BP 145/90

## 2017-03-20 DIAGNOSIS — R42 Dizziness and giddiness: Secondary | ICD-10-CM | POA: Diagnosis not present

## 2017-03-20 DIAGNOSIS — R2689 Other abnormalities of gait and mobility: Secondary | ICD-10-CM

## 2017-03-20 NOTE — Therapy (Signed)
[facility] [address] [place], [place], [address] Phone: [phone]   Fax:  [phone]  Physical Therapy Treatment  Patient Details  Name: Katherine [name] MRN: [id number] Date of Birth: 1940/12/17 Referring Provider: Dr. Leta Baptist  Encounter Date: 03/20/2017      PT End of Session - 03/20/17 1247    Visit Number 2   Number of Visits 9   Date for PT Re-Evaluation 04/05/17   Authorization Type G-CODE AND PROGRESS NOTE EVERY 10TH VISIT.    PT Start Time 1015   PT Stop Time 1103   PT Time Calculation (min) 48 min   Activity Tolerance Patient tolerated treatment well   Behavior During Therapy WFL for tasks assessed/performed      Past Medical History:  Diagnosis Date  . Exotropia of right eye    AND HYPERTROPIA  . Glaucoma   . Hyperlipidemia   . Hypertension   . Incomplete right bundle branch block   . LAFB (left anterior fascicular block)   . SUI (stress urinary incontinence, female)   . Type 2 diabetes mellitus ([employer])   . Wears dentures    upper denture and lower partial denture  . Wears glasses     Past Surgical History:  Procedure Laterality Date  . ADJUSTABLE SUTURE MANIPULATION Right 03/29/2016   Procedure: ADJUSTABLE SUTURE RIGHT EYE LATERAL RECTUS RESECTION RIGHT EYE ;  Surgeon: Gevena Cotton, MD;  Location: [facility];  Service: Ophthalmology;  Laterality: Right;  . CHOLECYSTECTOMY OPEN  1968  . EYE SURGERY  age 33   repair right eye injury  . GLAUCOMA SURGERY Right 08-23-2015  at [facility]  . MEDIAN RECTUS REPAIR Right 03/29/2016   Procedure: MEDIAN RECTUS RESECTION RIGHT EYE ADJUSTABLE SUTURE RIGHT EYE LATERAL RECTUS RESECTION RIGHT EYE ;  Surgeon: Gevena Cotton, MD;  Location: [facility];  Service: Ophthalmology;  Laterality: Right;    Vitals:   03/20/17 1029  BP: (!) 145/90        Subjective Assessment - 03/20/17 1019    Subjective Reports her BP is high  this morning.  Reading at home was 170/82.  Still c/o dizziness.  Glucose 268 today   Pertinent History Parkinsonism, R eye legally blind, exotropia and hypertropia R eye (surgery in 2017 to correct), HLD, HTN, R BBB, LAFB, SUI, DM, hx of glaucoma   Patient Stated Goals Stop my dizziness   Currently in Pain? No/denies                         [facility] Adult PT Treatment/Exercise - 03/20/17 1245      Self-Care   Self-Care Other Self-Care Comments   Other Self-Care Comments  extended education on how to check BP with cuff pt brought (manual), and education orthostatic hypotension.  Pt needs increased time to process/comprehend education.  Educated on balance and vestibular system input as well as goals of therapy.         Vestibular Treatment/Exercise - 03/20/17 0001      Vestibular Treatment/Exercise   Vestibular Treatment Provided Gaze   Gaze Exercises X1 Viewing Horizontal;X1 Viewing Vertical     X1 Viewing Horizontal   Foot Position feet together   Time --  30 sec   Reps 1   Comments increased time needed for education     X1 Viewing Vertical   Foot Position feet together   Time --  30 sec   Reps 1  Comments increased time needed for understanding of activity            Balance Exercises - 03/20/17 1045      Balance Exercises: Standing   Standing Eyes Closed Foam/compliant surface;Narrow base of support (BOS);3 reps;10 secs;Head turns           PT Education - 03/20/17 1247    Education provided Yes   Education Details see self care   Person(s) Educated Patient   Methods Explanation   Comprehension Verbalized understanding          PT Short Term Goals - 03/06/17 1646      PT SHORT TERM GOAL #1   Title same as LTGs           PT Long Term Goals - 03/06/17 1646      PT LONG TERM GOAL #1   Title Pt will be IND in HEP to improve dizziness and balance. TARGET DATE FOR ALL LTGS: 04/03/17   Status New     PT LONG TERM GOAL #2    Title Perform FGA and write goal.   Status New     PT LONG TERM GOAL #3   Title Pt will report dizziness </=3/10 during amb. in order to perform ADLs and functional mobility safely.    Status New     PT LONG TERM GOAL #4   Title Pt will amb. 1000' over even/uneven terrain, IND, without incr. in dizziness to improve functional mobility.    Status New               Plan - 03/20/17 1247    Clinical Impression Statement Pt arrived to therapy with many questions about BP and why she is dizzy.  Pt extensively educated on how to check BP (recommend automatic cuff instead of manual) as well as orthostatic hypotension, and vestibular system.  Able to initiate HEP at pt's request today, but unable to complete FGA due to time constraints.  Pt needs mod to max cues for exercises and increased time to read instructions.  Will cotninue to benefit from PT to address deficits.   PT Treatment/Interventions ADLs/Self Care Home Management;Biofeedback;Gait training;DME Instruction;Canalith Repostioning;Neuromuscular re-education;Balance training;Therapeutic exercise;Manual techniques;Therapeutic activities;Functional mobility training;Orthotic Fit/Training;Patient/family education;Stair training;Vestibular   PT Next Visit Plan Perform FGA and write goal, review balance HEP (and habituation HEP prn).    Consulted and Agree with Plan of Care Patient      Patient will benefit from skilled therapeutic intervention in order to improve the following deficits and impairments:  Abnormal gait, Decreased balance, Dizziness, Decreased mobility, Decreased safety awareness, Decreased knowledge of use of DME, Decreased strength  Visit Diagnosis: Dizziness and giddiness  Other abnormalities of gait and mobility     Problem List There are no active problems to display for this patient.     Laureen Abrahams, PT, DPT 03/20/17 12:50 PM    [facility] [address] [place], [place], [address] Phone: [phone]   Fax:  [phone]  Name: Katherine [name] MRN: [id number] Date of Birth: [birth year]

## 2017-03-20 NOTE — Patient Instructions (Signed)
Feet Together (Compliant Surface) Varied Arm Positions - Eyes Closed    Stand on compliant surface: _pillow or cushion_______ with feet together and arms out. Close eyes and visualize upright position. Hold__10__ seconds. Repeat _5___ times per session. Do __2__ sessions per day.  Copyright  VHI. All rights reserved.   Feet Together (Compliant Surface) Head Motion - Eyes Closed    Stand on compliant surface: _pillow or cushion_______ with feet together. Close eyes and move head slowly, up and down 10 times each way and side to side 10 times each way. Repeat _1-2___ times per session. Do _2___ sessions per day.  Copyright  VHI. All rights reserved.    Gaze Stabilization: Standing Feet Together    Feet together, keeping eyes on target on wall __3-5__ feet away, tilt head down 15-30 and move head side to side for _30___ seconds. Repeat while moving head up and down for _30___ seconds. Do _2___ sessions per day.  Copyright  VHI. All rights reserved.    Gaze Stabilization: Tip Card  1.Target must remain in focus, not blurry, and appear stationary while head is in motion. 2.Perform exercises with small head movements (45 to either side of midline). 3.Increase speed of head motion so long as target is in focus. 4.If you wear eyeglasses, be sure you can see target through lens (therapist will give specific instructions for bifocal / progressive lenses). 5.These exercises may provoke dizziness or nausea. Work through these symptoms. If too dizzy, slow head movement slightly. Rest between each exercise. 6.Exercises demand concentration; avoid distractions. 7.For safety, perform standing exercises close to a counter, wall, corner, or next to someone.  Copyright  VHI. All rights reserved.

## 2017-03-21 ENCOUNTER — Ambulatory Visit: Payer: Medicare Other | Admitting: Physical Therapy

## 2017-03-21 ENCOUNTER — Encounter: Payer: Self-pay | Admitting: Physical Therapy

## 2017-03-21 DIAGNOSIS — R42 Dizziness and giddiness: Secondary | ICD-10-CM | POA: Diagnosis not present

## 2017-03-21 DIAGNOSIS — R2689 Other abnormalities of gait and mobility: Secondary | ICD-10-CM

## 2017-03-21 NOTE — Therapy (Addendum)
[facility] [address] [place], [place], [address] Phone: [phone]   Fax:  [phone]  Physical Therapy Treatment  Patient Details  Name: Katherine [name] MRN: [id number] Date of Birth: 1941/05/06 Referring Provider: Dr. Leta Baptist  Encounter Date: 03/21/2017      PT End of Session - 03/21/17 1247    Visit Number 3   Number of Visits 9   Date for PT Re-Evaluation 04/05/17   Authorization Type G-CODE AND PROGRESS NOTE EVERY 10TH VISIT.    PT Start Time 1147   PT Stop Time 1235   PT Time Calculation (min) 48 min   Activity Tolerance Patient tolerated treatment well   Behavior During Therapy WFL for tasks assessed/performed      Past Medical History:  Diagnosis Date  . Exotropia of right eye    AND HYPERTROPIA  . Glaucoma   . Hyperlipidemia   . Hypertension   . Incomplete right bundle branch block   . LAFB (left anterior fascicular block)   . SUI (stress urinary incontinence, female)   . Type 2 diabetes mellitus ([place])   . Wears dentures    upper denture and lower partial denture  . Wears glasses     Past Surgical History:  Procedure Laterality Date  . ADJUSTABLE SUTURE MANIPULATION Right 03/29/2016   Procedure: ADJUSTABLE SUTURE RIGHT EYE LATERAL RECTUS RESECTION RIGHT EYE ;  Surgeon: Gevena Cotton, MD;  Location: [facility];  Service: Ophthalmology;  Laterality: Right;  . CHOLECYSTECTOMY OPEN  1968  . EYE SURGERY  age 64   repair right eye injury  . GLAUCOMA SURGERY Right 08-23-2015  at [facility]  . MEDIAN RECTUS REPAIR Right 03/29/2016   Procedure: MEDIAN RECTUS RESECTION RIGHT EYE ADJUSTABLE SUTURE RIGHT EYE LATERAL RECTUS RESECTION RIGHT EYE ;  Surgeon: Gevena Cotton, MD;  Location: [facility];  Service: Ophthalmology;  Laterality: Right;    There were no vitals filed for this visit.      Subjective Assessment - 03/21/17 1153    Subjective Reports BP this am was  122/82 but had some lower readings over the past few days.  Has a f/u appointment with PCP in a couple weeks; recommended that pt take her BP readinds with her to appt.   Pertinent History Parkinsonism, R eye legally blind, exotropia and hypertropia R eye (surgery in 2017 to correct), HLD, HTN, R BBB, LAFB, SUI, DM, hx of glaucoma   Limitations Walking   Patient Stated Goals Stop my dizziness   Currently in Pain? No/denies            [facility] PT Assessment - 03/21/17 1159      Functional Gait  Assessment   Gait assessed  Yes   Gait Level Surface Walks 20 ft in less than 7 sec but greater than 5.5 sec, uses assistive device, slower speed, mild gait deviations, or deviates 6-10 in outside of the 12 in walkway width.   Change in Gait Speed Makes only minor adjustments to walking speed, or accomplishes a change in speed with significant gait deviations, deviates 10-15 in outside the 12 in walkway width, or changes speed but loses balance but is able to recover and continue walking.   Gait with Horizontal Head Turns Performs head turns smoothly with slight change in gait velocity (eg, minor disruption to smooth gait path), deviates 6-10 in outside 12 in walkway width, or uses an assistive device.   Gait with Vertical Head Turns Performs task with  slight change in gait velocity (eg, minor disruption to smooth gait path), deviates 6 - 10 in outside 12 in walkway width or uses assistive device   Gait and Pivot Turn Pivot turns safely in greater than 3 sec and stops with no loss of balance, or pivot turns safely within 3 sec and stops with mild imbalance, requires small steps to catch balance.   Step Over Obstacle Is able to step over one shoe box (4.5 in total height) but must slow down and adjust steps to clear box safely. May require verbal cueing.   Gait with Narrow Base of Support Ambulates less than 4 steps heel to toe or cannot perform without assistance.   Gait with Eyes Closed Walks 20 ft, slow  speed, abnormal gait pattern, evidence for imbalance, deviates 10-15 in outside 12 in walkway width. Requires more than 9 sec to ambulate 20 ft.   Ambulating Backwards Walks 20 ft, uses assistive device, slower speed, mild gait deviations, deviates 6-10 in outside 12 in walkway width.   Steps Alternating feet, must use rail.   Total Score 15   FGA comment: 15/30 high risk for falls                     [facility] Adult PT Treatment/Exercise - 03/21/17 1242      Ambulation/Gait   Ambulation/Gait Yes   Ambulation/Gait Assistance 5: Supervision   Ambulation/Gait Assistance Details Performed gait assessment with various AD: RW, cane and rollator on level surfaces and incline on ramp.  Pt demonstrated improved gait sequence, upright gaze and decreased "dizziness" with use of rollator.   Ambulation Distance (Feet) 115 Feet   Assistive device Rolling walker;4-wheeled walker;Straight cane   Gait Pattern Step-through pattern;Decreased trunk rotation   Ambulation Surface Level   Ramp 5: Supervision             Balance Exercises - 03/20/17 1045      Balance Exercises: Standing   Standing Eyes Closed Foam/compliant surface;Narrow base of support (BOS);3 reps;10 secs;Head turns           PT Education - 03/21/17 1246    Education provided Yes   Education Details education on falls risk, MD recommendation for AD, various AD pros and cons   Person(s) Educated Patient   Methods Explanation   Comprehension Verbalized understanding          PT Short Term Goals - 03/06/17 1646      PT SHORT TERM GOAL #1   Title same as LTGs           PT Long Term Goals - 05/10/17 1521      PT LONG TERM GOAL #1   Title Pt will be IND in HEP to improve dizziness and balance. TARGET DATE FOR ALL LTGS: 04/03/17   Status Unable to assess     PT LONG TERM GOAL #2   Title Pt will improve safety with gait and decrease falls risk as indicated by increase in FGA score to >19/30   Baseline  15/30 on 03/21/2017   Status Unable to assess     PT LONG TERM GOAL #3   Title Pt will report dizziness </=3/10 during amb. in order to perform ADLs and functional mobility safely.    Status Unable to assess     PT LONG TERM GOAL #4   Title Pt will amb. 1000' over even/uneven terrain, IND, without incr. in dizziness to improve functional mobility.    Status Unable to  assess               Plan - 03/21/17 1248    Clinical Impression Statement Pt continues to have questions about BP readings and medications.  Discussed side effects of medications and low BP but deferred to MD regarding changes in medication.  Also continued to educate pt on BP readings (low vs. high) and recommended that pt take a log of BP every day and take with her to PCP appointment later this month.  Performed FGA and discussed high falls risk with pt.  Per MD note, MD recommending pt use RW and gave pt prescription for RW.  Performed gait assessment with RW, cane and rollator.  Pt demonstrated safest gait sequence with rollator and reports not sensing any dizziness when ambulating with rollator but discussed cons of needing assistance to load in/out of car and up/down stairs at home for entry/exit.  Pt given name, number, address of DME provider to obtain rollator.  Will continue to address.   Rehab Potential Good   Clinical Impairments Affecting Rehab Potential see PMH   PT Frequency 2x / week   PT Duration 4 weeks   PT Treatment/Interventions ADLs/Self Care Home Management;Biofeedback;Gait training;DME Instruction;Canalith Repostioning;Neuromuscular re-education;Balance training;Therapeutic exercise;Manual techniques;Therapeutic activities;Functional mobility training;Orthotic Fit/Training;Patient/family education;Stair training;Vestibular   PT Next Visit Plan Continue to assess/train gait with rollator on various surfaces; review adaptation and habituation exercises, add PWR! to HEP for parkinsonism   Consulted and  Agree with Plan of Care Patient      Patient will benefit from skilled therapeutic intervention in order to improve the following deficits and impairments:  Abnormal gait, Decreased balance, Dizziness, Decreased mobility, Decreased safety awareness, Decreased knowledge of use of DME, Decreased strength  Visit Diagnosis: Dizziness and giddiness  Other abnormalities of gait and mobility       G-Codes - June 04, 2017 1521    Functional Assessment Tool Used (Outpatient Only) Unable to assess; pt did not return for final therapy sessions   Functional Limitation Changing and maintaining body position   Changing and Maintaining Body Position Goal Status (V7846) At least 1 percent but less than 20 percent impaired, limited or restricted   Changing and Maintaining Body Position Discharge Status (N6295) At least 40 percent but less than 60 percent impaired, limited or restricted      Problem List There are no active problems to display for this patient.  PHYSICAL THERAPY DISCHARGE SUMMARY  Visits from Start of Care: 3  Current functional level related to goals / functional outcomes: Unable to assess-pt did not return for final therapy sessions   Remaining deficits: Impaired strength, gait, balance   Education / Equipment: HEP  Plan: Patient agrees to discharge.  Patient goals were not met. Patient is being discharged due to not returning since the last visit.  ?????      Raylene Everts, PT, DPT 06-04-2017    3:24 PM    [facility] [address] [place], [place], [address] Phone: [phone]   Fax:  [phone]  Name: Katherine [name] MRN: [id number] Date of Birth: September 29, 1941

## 2017-03-27 ENCOUNTER — Ambulatory Visit: Payer: Medicare Other

## 2017-03-28 ENCOUNTER — Ambulatory Visit: Payer: Medicare Other

## 2017-04-03 ENCOUNTER — Ambulatory Visit: Payer: Medicare Other

## 2017-05-10 ENCOUNTER — Encounter: Payer: Self-pay | Admitting: Physical Therapy

## 2017-06-12 ENCOUNTER — Encounter: Payer: Self-pay | Admitting: Diagnostic Neuroimaging

## 2017-06-12 ENCOUNTER — Encounter (INDEPENDENT_AMBULATORY_CARE_PROVIDER_SITE_OTHER): Payer: Self-pay

## 2017-06-12 ENCOUNTER — Ambulatory Visit (INDEPENDENT_AMBULATORY_CARE_PROVIDER_SITE_OTHER): Payer: Medicare Other | Admitting: Diagnostic Neuroimaging

## 2017-06-12 VITALS — BP 146/61 | HR 105 | Ht 66.0 in | Wt 131.8 lb

## 2017-06-12 DIAGNOSIS — I639 Cerebral infarction, unspecified: Secondary | ICD-10-CM

## 2017-06-12 DIAGNOSIS — E1142 Type 2 diabetes mellitus with diabetic polyneuropathy: Secondary | ICD-10-CM | POA: Diagnosis not present

## 2017-06-12 DIAGNOSIS — R42 Dizziness and giddiness: Secondary | ICD-10-CM

## 2017-06-12 NOTE — Patient Instructions (Signed)
-   stop carbidopa / levodopa  - follow up with primary care

## 2017-06-12 NOTE — Progress Notes (Signed)
[facility]  PATIENT: Katherine [name] DOB: 06/05/1941  REFERRING CLINICIAN: Pavelock HISTORY FROM: patient REASON FOR VISIT: follow up   HISTORICAL  CHIEF COMPLAINT:  Chief Complaint  Patient presents with  . Follow-up    3 mo f/u  . PD/ HX stroke    no change from taking sinemt, (has not been taking aspirin or mvi)     HISTORY OF PRESENT ILLNESS:   UPDATE 06/12/17: Since last visit, was having dizziness with carb/levo. So we advised her to stop and then take log of BP. Patient stopped carb/levo, but did not bring her log of BP today. Patient is orthostatic on her vitals today. Patient has been to PT twice. She has a cane at home, but doesn't need   UPDATE 02/20/17: Since last visit symptoms are stable. Has not had PT yet. Here with husband. Asking about meds, PT, and treatment options. No major falls. Some minor trips.   PRIOR HPI (11/20/16): 76 year old right-handed female here for evaluation of dizziness. For past few months (at least since June or July 2017) patient notes a "dizziness" sensation which she describes as feeling off balance, swimmy headed, movement in her head. Symptoms fairly stable over past few months. She denies room spinning sensation, nausea or vomiting. No positional trigger. Symptoms worse when she standing and walking. She does not feel this when she is sitting up, laying down or turning her head around. Patient has noted generalized slowing, fatigue, slow movements for almost 1 year. She also has significant depression, anxiety, poor sleep, decreased energy, disinterest in activities and stress at home. 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   REVIEW OF SYSTEMS: Full 14 system review of systems performed and negative with exception of: decr appetite fatigue dizziness numbness blurred vision light sensitivity frequent  waking.   ALLERGIES: No Known Allergies  HOME MEDICATIONS: Outpatient Medications Prior to Visit  Medication Sig Dispense Refill  . ALPRAZolam (XANAX) 0.5 MG tablet Take 1 tablet by mouth daily as needed. For anxiety    . carbidopa-levodopa (SINEMET IR) 25-100 MG tablet Take 1 tablet by mouth 3 (three) times daily before meals. 90 tablet 6  . glipiZIDE (GLUCOTROL) 5 MG tablet Take 5 mg by mouth 3 (three) times daily.     Marland Kitchen lisinopril (PRINIVIL,ZESTRIL) 5 MG tablet Take 5 mg by mouth every morning.     . pravastatin (PRAVACHOL) 10 MG tablet Take 1 tablet by mouth every morning.     Marland Kitchen aspirin EC 81 MG tablet Take 81 mg by mouth daily.    . Multiple Vitamin (MULTIVITAMIN) tablet Take 1 tablet by mouth daily.     No facility-administered medications prior to visit.     PAST MEDICAL HISTORY: Past Medical History:  Diagnosis Date  . Exotropia of right eye    AND HYPERTROPIA  . Glaucoma   . Hyperlipidemia   . Hypertension   . Incomplete right bundle branch block   . LAFB (left anterior fascicular block)   . SUI (stress urinary incontinence, female)   . Type 2 diabetes mellitus ([place])   . Wears dentures    upper denture and lower partial denture  . Wears glasses     PAST SURGICAL HISTORY: Past Surgical History:  Procedure Laterality Date  . ADJUSTABLE SUTURE MANIPULATION Right 03/29/2016   Procedure: ADJUSTABLE SUTURE RIGHT EYE LATERAL RECTUS RESECTION RIGHT EYE ;  Surgeon: Gevena Cotton, MD;  Location: [facility];  Service: Ophthalmology;  Laterality: Right;  . CHOLECYSTECTOMY OPEN  1968  . EYE SURGERY  age 3   repair right eye injury  . GLAUCOMA SURGERY Right 08-23-2015  at [facility]  . MEDIAN RECTUS REPAIR Right 03/29/2016   Procedure: MEDIAN RECTUS RESECTION RIGHT EYE ADJUSTABLE SUTURE RIGHT EYE LATERAL RECTUS RESECTION RIGHT EYE ;  Surgeon: Gevena Cotton, MD;  Location: [facility];  Service: Ophthalmology;  Laterality: Right;    FAMILY  HISTORY: Family History  Problem Relation Age of Onset  . Heart disease Father     SOCIAL HISTORY:  Social History   Social History  . Marital status: Married    Spouse name: Stephanie Coup  . Number of children: 3  . Years of education: 18   Occupational History  .      NA   Social History Main Topics  . Smoking status: Former Smoker    Packs/day: 0.50    Years: 5.00    Types: Cigarettes    Quit date: 12/25/2012  . Smokeless tobacco: Never Used     Comment: pt reports she occasionally smokes 2-3 cigarettes a week (since diagnosed with Parkinsonism)  . Alcohol use Yes     Comment: 1 beer at a special occasion  . Drug use: No  . Sexual activity: Not on file   Other Topics Concern  . Not on file   Social History Narrative   Lives at home with husband   No caffeine     PHYSICAL EXAM  GENERAL EXAM/CONSTITUTIONAL: Vitals:  Vitals:   06/12/17 1306  BP: (!) 146/61  Pulse: (!) 105  Weight: 131 lb 12.8 oz (59.8 kg)  Height: 5\' 6"  (1.676 m)   Orthostatic VS for the past 24 hrs (Last 3 readings):  BP- Lying Pulse- Lying BP- Standing at 0 minutes Pulse- Standing at 0 minutes  06/12/17 1322 146/61 105 118/57 102    Wt Readings from Last 3 Encounters:  06/12/17 131 lb 12.8 oz (59.8 kg)  02/20/17 137 lb 12.8 oz (62.5 kg)  11/20/16 131 lb 12.8 oz (59.8 kg)   Body mass index is 21.27 kg/m. No exam data present  Patient is in no distress; well developed, nourished and groomed; neck is supple  MASKED FACIES; DECREASED BLINKING  HOARSE VOICE  PRESSURED SPEECH  TANGENTIAL; ARGUMENTATIVE  CARDIOVASCULAR:  Examination of carotid arteries is normal; no carotid bruits  Regular rate and rhythm, no murmurs  Examination of peripheral vascular system by observation and palpation is normal  EYES:  RIGHT EYE POST-SURG; LIGHT PERCEPTION ONLY  LEFT EYE CATARACT; ophthalmoscopic exam of optic disc and posterior segment is normal; no papilledema or  hemorrhages  MUSCULOSKELETAL:  Gait, strength, tone, movements noted in Neurologic exam below  NEUROLOGIC: MENTAL STATUS:  No flowsheet data found.  awake, alert, oriented to person, place and time  recent and remote memory intact  normal attention and concentration  language fluent, comprehension intact, naming intact,   fund of knowledge appropriate  CRANIAL NERVE:   2nd - no papilledema on fundoscopic exam; RIGHT EYE POST-SURGICAL  2nd, 3rd, 4th, 6th - RIGHT PUPIL NO REACTION POST-OP; LEFT EYE 3-->2 reactive to light, LEFT EYE visual fields full to confrontation, extraocular muscles --> SACCADIC DYSMETRIA; no nystagmus  5th - facial sensation symmetric  7th - facial strength symmetric  8th - hearing intact  9th - palate elevates symmetrically, uvula midline  11th - shoulder shrug symmetric  12th - tongue protrusion midline  HOARSE VOICE  MOTOR:   normal bulk   BRADYKINESIA IN BUE AND BLE; SUBTLE MOUTH TREMOR NOTED WITH HAND RAPID ALTERNATING MOVEMENTS  full strength in the BUE, BLE; EXCEPT RIGHT WRIST LIMITED BY PAIN  SENSORY:   normal and symmetric to light touch, temperature, vibration  COORDINATION:   finger-nose-finger, fine finger movements SLOW  REFLEXES:   deep tendon reflexes TRACE and symmetric  POSITIVE SNOUT AND MYERSONS REFLEXES; NEG PALMOMENTAL  GAIT/STATION:   narrow based gait; SHORT STEPS; STOOPED POSTURE; DECR ARM SWING; SLIGHT EN BLOC TURNING     DIAGNOSTIC DATA (LABS, IMAGING, TESTING) - I reviewed patient records, labs, notes, testing and imaging myself where available.  Lab Results  Component Value Date   WBC 9.5 06/25/2012   HGB 13.6 03/29/2016   HCT 40.0 03/29/2016   MCV 89.6 06/25/2012   PLT 220 06/25/2012      Component Value Date/Time   NA 144 03/29/2016 1015   K 3.5 03/29/2016 1015   CL 103 03/29/2016 1015   CO2 27 06/25/2012 0959   GLUCOSE 154 (H) 03/29/2016 1015   BUN 13 03/29/2016 1015    CREATININE 0.60 03/29/2016 1015   CALCIUM 10.0 06/25/2012 0959   PROT 7.6 06/25/2012 0959   ALBUMIN 4.5 06/25/2012 0959   AST 13 06/25/2012 0959   ALT 17 06/25/2012 0959   ALKPHOS 75 06/25/2012 0959   BILITOT 0.3 06/25/2012 0959   GFRNONAA 87 (L) 06/25/2012 0959   GFRAA >90 06/25/2012 0959   Lab Results  Component Value Date   CHOL 172 06/25/2012   HDL 101 06/25/2012   LDLCALC 46 06/25/2012   TRIG 125 06/25/2012   CHOLHDL 1.7 06/25/2012   Lab Results  Component Value Date   HGBA1C 6.5 (H) 06/25/2012   No results found for: VITAMINB12 Lab Results  Component Value Date   TSH 0.585 06/25/2012    05/26/16 MRI BRAIN [report only] - chronic small vessel ischemic disease with lacunar infarcts in left thalamus and right cerebellum    ASSESSMENT AND PLAN  76 y.o. year old female here with intermittent dizziness, balance difficulty, slow movements, masked facies, over past 6-12 months. 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 Dizziness and giddiness   2. Cerebellar stroke ([place])   3. Diabetic polyneuropathy associated with type 2 diabetes mellitus (HCC)      PLAN:  I spent 25 minutes of face to face time with patient. Greater than 50% of time was spent in counseling and coordination of care with patient. In summary we discussed:   DIZZINESS (mainly due to orthostatic hypotension, related to diabetic neuropathy and possible parkinsonism) - keep BP log and follow up with PCP  GAIT DIFF (due to stroke, parkinsonism and neuropathy) - continue PT evaluation - use cane or walker if balance feels off  PARKINSONISM (vascular vs idiopathic) - reviewed parkinsonism and treatment options --> stop carb/levo (not effective)  STROKE  - stroke prevention (aspirin, diabetes tx,  lipid mgmt, BP control)  DEPRESSION - consider therapist/counselor to help with stress / depression  Return if symptoms worsen or fail to improve, for return to PCP.    Penni Bombard, MD 9/62/2297, 9:89 PM Certified in Neurology, Neurophysiology and Neuroimaging  [facility] [address] [place], [place] [address] [phone]

## 2017-07-31 ENCOUNTER — Telehealth: Payer: Self-pay | Admitting: Diagnostic Neuroimaging

## 2017-07-31 NOTE — Telephone Encounter (Signed)
Pt has called with the request of wanting to change providers, pt has stated that she is not pleased with Dr Gladstone Lighter bedside manner.  Pt would also like to be seen by a doctor and not students re: her physical therapy.  Pt states whenever she goes it is always a different student and she would just prefer to be seen by 1 doctor as it relates to her Parkinson's  Physical therapy.  Please call

## 2017-08-01 NOTE — Telephone Encounter (Signed)
I spoke to pt.  She expressed concern over her PT sessions that she had.  (she saw Geoffry Paradise first time and then students the next few times).  She feels that she did not get much therapy (more talk then therapy when she went).  I told her that this is something that she needed to bring up with them at the therapist office.  I also explained that since Dr. Leta Baptist has referred back to pcp, her pcp would need to refer her to another neurologist (and due to her sx, PD sx, a movement specialist would be a good choice (Dr. Carles Collet in [place]).  Pt would contact her pcp. She verbalized understanding.

## 2017-10-08 ENCOUNTER — Other Ambulatory Visit: Payer: Self-pay | Admitting: Nurse Practitioner

## 2017-10-08 ENCOUNTER — Other Ambulatory Visit: Payer: Self-pay | Admitting: Specialist

## 2017-10-08 DIAGNOSIS — Z1231 Encounter for screening mammogram for malignant neoplasm of breast: Secondary | ICD-10-CM

## 2017-11-12 ENCOUNTER — Other Ambulatory Visit: Payer: Self-pay | Admitting: Specialist

## 2017-11-12 DIAGNOSIS — E559 Vitamin D deficiency, unspecified: Secondary | ICD-10-CM

## 2017-12-07 ENCOUNTER — Ambulatory Visit
Admission: RE | Admit: 2017-12-07 | Discharge: 2017-12-07 | Disposition: A | Discharge status: Home / Self Care | Payer: Medicare Other | Source: Ambulatory Visit | Attending: Specialist | Admitting: Specialist

## 2017-12-07 DIAGNOSIS — Z1231 Encounter for screening mammogram for malignant neoplasm of breast: Secondary | ICD-10-CM

## 2018-03-24 ENCOUNTER — Other Ambulatory Visit: Payer: Self-pay | Admitting: Diagnostic Neuroimaging

## 2018-06-19 ENCOUNTER — Encounter: Payer: Self-pay | Admitting: Neurology

## 2018-07-03 NOTE — Progress Notes (Signed)
Katherine [name] was seen today in the movement disorders clinic for neurologic consultation at the request of Pavelock, Ralene Bathe, MD.  The consultation is for the evaluation of Parkinson's disease.  Records have been reviewed that are made available to me, both by primary care as well as prior neurologist, Dr. Leta Baptist.  Patient is a 77 year old female with a history of diabetes, anxiety, alcohol use/abuse who presents today to discuss Parkinson's disease.  She has only seen Dr. Leta Baptist on 3 occasions, the last of which was July, 2018.  She first saw him in January, 2018 and presented with dizziness.  Dr. Leta Baptist felt that her symptoms were related to lacunar ischemic infarcts, diabetic neuropathy and possible Parkinson's disease when she first saw him in January, 2018.  Diagnosis was similar when she saw him in April, 2018, but trial of levodopa was recommended.  She called only 14 days after stopping the medication and felt that dizziness was worsened and the medication was discontinued.  She presents today on carbidopa/levodopa 25/100 but due to dizziness she takes it "once in a while."  She isn't taking it daily.  "Then they sent me to therapy and they talked and that's it."   Biggest c/o by far is continued dizziness with standing.  Doesn't happen with sitting or turning head.    Specific Symptoms:  Tremor: Yes.  , "now on then" - R arm>L arm.  Nothing in the leg.   Family hx of similar:  No. Voice: it may be weaker - "I imagine so" Sleep: trouble sleeping  Vivid Dreams:  Yes.    Acting out dreams:  No. Wet Pillows: No.  Postural symptoms:  Yes.   because of dizziness  Falls?  No. Bradykinesia symptoms: no bradykinesia noted Loss of smell:  No. Loss of taste:  No. Urinary Incontinence:  No. Difficulty Swallowing:  No. Handwriting, micrographia: Yes.   Trouble with ADL's:  No.  Trouble buttoning clothing: No. Depression:  Yes.  ; does admit to irritability Memory changes:  Yes.  ,  minor changes.  More trouble with short term than long term Hallucinations:  No.  visual distortions: No. N/V:  No. Lightheaded:  Yes.    Syncope: No. Diplopia:  No. Dyskinesia:  No. Prior exposure to reglan/antipsychotics: No.   MRI of the brain was completed through [facility].  I do not have the films, only the report.  This is dated May 26, 2016.  It was reported to show chronic small vessel disease and lacunar infarct in the left thalamus and right cerebellum.  PREVIOUS MEDICATIONS: Sinemet  ALLERGIES:  No Known Allergies  CURRENT MEDICATIONS:  Outpatient Encounter Medications as of 07/08/2018  Medication Sig  . ALPRAZolam (XANAX) 0.5 MG tablet Take 1 tablet by mouth daily as needed. For anxiety  . carbidopa-levodopa (SINEMET IR) 25-100 MG tablet Take 1 tablet by mouth 3 (three) times daily before meals.  Marland Kitchen glipiZIDE (GLUCOTROL) 5 MG tablet Take 5 mg by mouth 3 (three) times daily.   Marland Kitchen lisinopril (PRINIVIL,ZESTRIL) 5 MG tablet Take 5 mg by mouth every morning.   . Multiple Vitamin (MULTIVITAMIN) tablet Take 1 tablet by mouth daily.  . pravastatin (PRAVACHOL) 10 MG tablet Take 1 tablet by mouth every morning.   . [DISCONTINUED] aspirin EC 81 MG tablet Take 81 mg by mouth daily.  . [DISCONTINUED] metFORMIN (GLUCOPHAGE) 500 MG tablet Take 500 mg by mouth 2 (two) times daily with a meal.   No facility-administered encounter medications on file  as of 07/08/2018.     PAST MEDICAL HISTORY:   Past Medical History:  Diagnosis Date  . Exotropia of right eye    AND HYPERTROPIA  . Glaucoma   . Hyperlipidemia   . Hypertension   . Incomplete right bundle branch block   . LAFB (left anterior fascicular block)   . SUI (stress urinary incontinence, female)   . Type 2 diabetes mellitus ([place])   . Wears dentures    upper denture and lower partial denture  . Wears glasses     PAST SURGICAL HISTORY:   Past Surgical History:  Procedure Laterality Date  . ADJUSTABLE SUTURE  MANIPULATION Right 03/29/2016   Procedure: ADJUSTABLE SUTURE RIGHT EYE LATERAL RECTUS RESECTION RIGHT EYE ;  Surgeon: Gevena Cotton, MD;  Location: [facility];  Service: Ophthalmology;  Laterality: Right;  . CHOLECYSTECTOMY OPEN  1968  . EYE SURGERY  age 61   repair right eye injury  . GLAUCOMA SURGERY Right 08-23-2015  at [facility]  . MEDIAN RECTUS REPAIR Right 03/29/2016   Procedure: MEDIAN RECTUS RESECTION RIGHT EYE ADJUSTABLE SUTURE RIGHT EYE LATERAL RECTUS RESECTION RIGHT EYE ;  Surgeon: Gevena Cotton, MD;  Location: [facility];  Service: Ophthalmology;  Laterality: Right;    SOCIAL HISTORY:   Social History   Socioeconomic History  . Marital status: Married    Spouse name: Stephanie Coup  . Number of children: 3  . Years of education: 63  . Highest education level: Not on file  Occupational History  . Occupation: retired    Comment: [employer]  . Financial resource strain: Not on file  . Food insecurity:    Worry: Not on file    Inability: Not on file  . Transportation needs:    Medical: Not on file    Non-medical: Not on file  Tobacco Use  . Smoking status: Current Every Day Smoker    Packs/day: 0.50    Years: 5.00    Pack years: 2.50    Types: Cigarettes  . Smokeless tobacco: Never Used  . Tobacco comment: reports 3 cigarettes per day  Substance and Sexual Activity  . Alcohol use: Yes    Comment: "occasionally" I have a beer.  estimates 4 beers/week  . Drug use: No  . Sexual activity: Not on file  Lifestyle  . Physical activity:    Days per week: Not on file    Minutes per session: Not on file  . Stress: Not on file  Relationships  . Social connections:    Talks on phone: Not on file    Gets together: Not on file    Attends religious service: Not on file    Active member of club or organization: Not on file    Attends meetings of clubs or organizations: Not on file    Relationship status: Not on file  .  Intimate partner violence:    Fear of current or ex partner: Not on file    Emotionally abused: Not on file    Physically abused: Not on file    Forced sexual activity: Not on file  Other Topics Concern  . Not on file  Social History Narrative   Lives at home with husband   No caffeine    FAMILY HISTORY:   Family Status  Relation Name Status  . Mother  Deceased       "old age" - was 10 y/o  . Father  Deceased  . Sister  3 Deceased  . Brother 1 Alive  . Son 3 Alive    ROS:  Review of Systems  Constitutional: Negative.   HENT: Negative.   Eyes: Positive for blurred vision.  Respiratory: Negative.   Cardiovascular: Negative.   Gastrointestinal: Negative.   Genitourinary: Negative.   Skin: Negative.   Endo/Heme/Allergies: Negative.     PHYSICAL EXAMINATION:    VITALS:   Vitals:   07/08/18 1354  BP: 140/80  Pulse: (!) 102  SpO2: 96%  Weight: 131 lb (59.4 kg)  Height: 5\' 6"  (1.676 m)   Orthostatic VS for the past 24 hrs (Last 3 readings):  BP- Lying Pulse- Lying BP- Sitting Pulse- Sitting BP- Standing at 0 minutes Pulse- Standing at 0 minutes  07/08/18 1525 140/90 86 140/80 87 130/60 92     GEN:  The patient appears stated age and is in NAD. HEENT:  Normocephalic, atraumatic.  The mucous membranes are moist. The superficial temporal arteries are without ropiness or tenderness. CV:  Tachy.  regular Lungs:  CTAB Neck/HEME:  There are no carotid bruits bilaterally.  Neurological examination:  Orientation: The patient is alert and oriented x3. Fund of knowledge is appropriate.  Recent and remote memory are intact.  Attention and concentration are normal.    Able to name objects and repeat phrases. Cranial nerves: There is good facial symmetry with the exception of R pseudoptosis (result of surgery per pt).  Fundoscopic exam reveals clear margins on the L.  Unable to see on the right.  No red reflex on the right. Mild R exotropia with few beats horizontal nystagmus  that easily fatigues. The visual fields are full to confrontational testing. The speech is fluent and clear. Soft palate rises symmetrically and there is no tongue deviation. Hearing is intact to conversational tone. Sensation: Sensation is intact to light and pinprick throughout (facial, trunk, extremities). Vibration is intact at the bilateral big toe. There is no extinction with double simultaneous stimulation. There is no sensory dermatomal level identified. Motor: Strength is 5/5 in the bilateral upper and lower extremities.   Shoulder shrug is equal and symmetric.  There is no pronator drift. Deep tendon reflexes: Deep tendon reflexes are 2+-3-/4 at the bilateral biceps, triceps, brachioradialis, patella and achilles. Plantar responses are downgoing bilaterally.  Movement examination: Tone: There is normal tone in the upper and lower extremities. Abnormal movements: None, even with distraction procedures Coordination:  There is no decremation with any form of rapid alternating movements, although she is a bit slow Gait and Station: The patient has no difficulty arising out of a deep-seated chair without the use of the hands. The patient's stride length is decreased but she does not shuffle.  She is slow and purposeful.    Labs: Lab work is reviewed and is dated April 25, 2018.  Sodium is 142, potassium 4.2, chloride 105, CO2 30, BUN 15 and creatinine 0.71.  Blood glucose was 160.  AST 12, ALT 13, alkaline phosphatase 82.  Hemoglobin A1c was 7.9.  ASSESSMENT/PLAN:  1.  Dizziness  -Patient does not meet criteria for idiopathic Parkinson's disease.  She has minimal bradykinesia, but no tremor or rigidity.  1 of the 2 of those would be required for the diagnosis via [place] brain bank criteria.  She did not think levodopa helped, although I am not sure if she took it for very long.  Regardless, I told her to go ahead and stop that completely (currently taking as needed).  -Patient asked me the source  of her dizziness.  I told her that I really do not know.  She describes orthostatic dizziness, but was not particularly orthostatic in the office today.  However, that did reproduce her symptoms.  I will go ahead and check a carotid ultrasound to make sure we are not missing anything, but told her she really needed to follow-up with her primary care physician about the dizziness.  The patient had many questions and I answered them to the best of my ability.  Greater than 50% of the 45-minute visit was spent in counseling with the patient.  2.  The patient will follow-up with me on an as-needed basis.  Happy to see her back should new neurologic issues arise.  Cc:  Boyce Medici, FNP

## 2018-07-08 ENCOUNTER — Encounter: Payer: Self-pay | Admitting: Neurology

## 2018-07-08 ENCOUNTER — Ambulatory Visit (INDEPENDENT_AMBULATORY_CARE_PROVIDER_SITE_OTHER): Payer: Medicare Other | Admitting: Neurology

## 2018-07-08 VITALS — BP 140/80 | HR 102 | Ht 66.0 in | Wt 131.0 lb

## 2018-07-08 DIAGNOSIS — R42 Dizziness and giddiness: Secondary | ICD-10-CM

## 2018-07-08 NOTE — Patient Instructions (Signed)
You can stop your carbidopa/levodopa 25/100

## 2019-01-06 ENCOUNTER — Telehealth: Payer: Self-pay | Admitting: Neurology

## 2019-01-06 NOTE — Telephone Encounter (Signed)
Note sent to Dr. Alphonzo Grieve. It looks like this was sent to them after her visit in August as well.

## 2019-01-06 NOTE — Telephone Encounter (Signed)
Butch Penny the [facility] Nurse Practitioner @ Dr.Pavelock's office is needing the office notes from patient appt. Please fax to [phone]. Thanks!

## 2019-02-03 ENCOUNTER — Other Ambulatory Visit: Payer: Self-pay | Admitting: Family Medicine

## 2019-02-03 DIAGNOSIS — I639 Cerebral infarction, unspecified: Secondary | ICD-10-CM

## 2019-02-10 ENCOUNTER — Ambulatory Visit
Admission: RE | Admit: 2019-02-10 | Discharge: 2019-02-10 | Disposition: A | Discharge status: Home / Self Care | Payer: Medicare Other | Source: Ambulatory Visit | Attending: Family Medicine | Admitting: Family Medicine

## 2019-02-10 ENCOUNTER — Other Ambulatory Visit: Payer: Self-pay

## 2019-02-10 DIAGNOSIS — I639 Cerebral infarction, unspecified: Secondary | ICD-10-CM

## 2019-02-10 MED ORDER — GADOBENATE DIMEGLUMINE 529 MG/ML IV SOLN
12.0000 mL | Freq: Once | INTRAVENOUS | Status: AC | PRN
  Administered 2019-02-10: 12 mL via INTRAVENOUS

## 2019-02-17 ENCOUNTER — Other Ambulatory Visit: Payer: Medicare Other

## 2019-02-24 ENCOUNTER — Telehealth: Payer: Self-pay

## 2019-02-25 NOTE — Telephone Encounter (Signed)
Spoke with the patient. VV on Thursday as planned.  Thanks MJP

## 2019-02-27 ENCOUNTER — Ambulatory Visit (INDEPENDENT_AMBULATORY_CARE_PROVIDER_SITE_OTHER): Payer: Medicare Other | Admitting: Cardiology

## 2019-02-27 ENCOUNTER — Encounter: Payer: Self-pay | Admitting: Cardiology

## 2019-02-27 ENCOUNTER — Other Ambulatory Visit: Payer: Self-pay

## 2019-02-27 VITALS — BP 124/52 | HR 96 | Ht 66.0 in | Wt 129.0 lb

## 2019-02-27 DIAGNOSIS — I6381 Other cerebral infarction due to occlusion or stenosis of small artery: Secondary | ICD-10-CM

## 2019-02-27 DIAGNOSIS — E782 Mixed hyperlipidemia: Secondary | ICD-10-CM

## 2019-02-27 DIAGNOSIS — R42 Dizziness and giddiness: Secondary | ICD-10-CM | POA: Diagnosis not present

## 2019-02-27 NOTE — Progress Notes (Signed)
Virtual Visit via Video Note   Subjective:   Katherine [name], female    DOB: [birth year], 78 y.o.   MRN: [id number]   I connected with the patient on 02/27/19 by a video enabled telemedicine application and verified that I am speaking with the correct person using two identifiers.     I discussed the limitations of evaluation and management by telemedicine and the availability of in person appointments. The patient expressed understanding and agreed to proceed.   This visit type was conducted due to national recommendations for restrictions regarding the COVID-19 Pandemic (e.g. social distancing).  This format is felt to be most appropriate for this patient at this time.  All issues noted in this document were discussed and addressed.  No physical exam was performed (except for noted visual exam findings with Tele health visits).  The patient has consented to conduct a Tele health visit and understands insurance will be billed.     Chief complaint:  Dizziness   HPI  78 y/o [place] American female with type 2 DM, with persistent dizziness  Patient has been seen by me, as well as Neurologists Dr. Leta Baptist ([place]) and [facility] neurology). Her cardiovascular workup is unremarkable to explain her symptoms. Dr. Carles Collet felt that she did not meet criteria for Parkinson's disease. While her symptoms appear postural in nature, her orthostatic vitals have been negative. It appears that patient's PCP Dr. Criss Rosales had ordered a brain MRI. MRI shows progressive chronic small vessel ischemic disease from 2010, including multiple lacunar infarcts. Patient does not have any other neurological symptoms, including weakness, numbness, speech abnormalities, facial droop, visual disturbances etc.    Past Medical History:  Diagnosis Date  . Exotropia of right eye    AND HYPERTROPIA  . Glaucoma   . Hyperlipidemia   . Hypertension   . Incomplete right bundle branch block   . LAFB (left anterior fascicular  block)   . SUI (stress urinary incontinence, female)   . Type 2 diabetes mellitus ([place])   . Wears dentures    upper denture and lower partial denture  . Wears glasses      Past Surgical History:  Procedure Laterality Date  . ADJUSTABLE SUTURE MANIPULATION Right 03/29/2016   Procedure: ADJUSTABLE SUTURE RIGHT EYE LATERAL RECTUS RESECTION RIGHT EYE ;  Surgeon: Gevena Cotton, MD;  Location: [facility];  Service: Ophthalmology;  Laterality: Right;  . CHOLECYSTECTOMY OPEN  1968  . EYE SURGERY  age 67   repair right eye injury  . GLAUCOMA SURGERY Right 08-23-2015  at [facility]  . MEDIAN RECTUS REPAIR Right 03/29/2016   Procedure: MEDIAN RECTUS RESECTION RIGHT EYE ADJUSTABLE SUTURE RIGHT EYE LATERAL RECTUS RESECTION RIGHT EYE ;  Surgeon: Gevena Cotton, MD;  Location: [facility];  Service: Ophthalmology;  Laterality: Right;     Social History   Socioeconomic History  . Marital status: Married    Spouse name: Stephanie Coup  . Number of children: 3  . Years of education: 12  . Highest education level: Not on file  Occupational History  . Occupation: retired    Comment: [employer]  . Financial resource strain: Not on file  . Food insecurity:    Worry: Not on file    Inability: Not on file  . Transportation needs:    Medical: Not on file    Non-medical: Not on file  Tobacco Use  . Smoking status: Current Every Day Smoker    Packs/day: 0.50  Years: 5.00    Pack years: 2.50    Types: Cigarettes  . Smokeless tobacco: Never Used  . Tobacco comment: reports 3 cigarettes per day  Substance and Sexual Activity  . Alcohol use: Yes    Comment: "occasionally" I have a beer.  estimates 4 beers/week  . Drug use: No  . Sexual activity: Not on file  Lifestyle  . Physical activity:    Days per week: Not on file    Minutes per session: Not on file  . Stress: Not on file  Relationships  . Social connections:    Talks on phone: Not on  file    Gets together: Not on file    Attends religious service: Not on file    Active member of club or organization: Not on file    Attends meetings of clubs or organizations: Not on file    Relationship status: Not on file  . Intimate partner violence:    Fear of current or ex partner: Not on file    Emotionally abused: Not on file    Physically abused: Not on file    Forced sexual activity: Not on file  Other Topics Concern  . Not on file  Social History Narrative   Lives at home with husband   No caffeine     Family History  Problem Relation Age of Onset  . Emphysema Father   . Heart failure Father   . Heart failure Sister   . COPD Brother      Current Outpatient Medications on File Prior to Visit  Medication Sig Dispense Refill  . ALPRAZolam (XANAX) 0.5 MG tablet Take 1 tablet by mouth daily as needed. For anxiety    . carbidopa-levodopa (SINEMET IR) 25-100 MG tablet Take 1 tablet by mouth 3 (three) times daily before meals. 90 tablet 6  . glipiZIDE (GLUCOTROL) 5 MG tablet Take 5 mg by mouth 3 (three) times daily.     Marland Kitchen lisinopril (PRINIVIL,ZESTRIL) 5 MG tablet Take 5 mg by mouth every morning.     . Multiple Vitamin (MULTIVITAMIN) tablet Take 1 tablet by mouth daily.    . pravastatin (PRAVACHOL) 10 MG tablet Take 1 tablet by mouth every morning.      No current facility-administered medications on file prior to visit.     Cardiovascular studies:  Carotid artery duplex 12/03/2018: No evidence of significant stenosis in the right carotid vessels. 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 Minimal plaque noted. Antegrade right vertebral artery flow. Antegrade left vertebral artery flow. Follow up in one year is appropriate if clinically indicated.  MRI Brain 02/10/2019: 1. No acute intracranial abnormality. 2. Progressive chronic small vessel ischemic disease from 2010 including multiple lacunar infarcts as above.   Recent  labs: 09/06/2018: Glucose 237.  BUN/creatinine 15/0.85.  EGFR 67/73 with sodium 137, potassium 4.3.  Rest of the CMP normal. H/H 13/39.  MCV 87.  Platelets 206. Abdomen A1c 8.0% Cholesterol 163, triglycerides 153, HDL 76, LDL 64.   Review of Systems  Constitution: Negative for decreased appetite, malaise/fatigue, weight gain and weight loss.  HENT: Negative for congestion.   Eyes: Negative for visual disturbance.  Cardiovascular: Negative for chest pain, dyspnea on exertion, leg swelling, palpitations and syncope.  Respiratory: Negative for shortness of breath.   Endocrine: Negative for cold intolerance.  Hematologic/Lymphatic: Does not bruise/bleed easily.  Skin: Negative for itching and rash.  Musculoskeletal: Negative for myalgias.  Gastrointestinal: Negative for abdominal pain, nausea  and vomiting.  Genitourinary: Negative for dysuria.  Neurological: Positive for dizziness. Negative for weakness.  Psychiatric/Behavioral: The patient is not nervous/anxious.   All other systems reviewed and are negative.        Vitals:   02/27/19 1400  BP: (!) 124/52  Pulse: 96  SpO2: 98%     Objective:    Physical Exam  Constitutional: She is oriented to person, place, and time. She appears well-developed and well-nourished. No distress.  HENT:  Head: Normocephalic and atraumatic.  Eyes: Pupils are equal, round, and reactive to light. Conjunctivae are normal.  Arcus senalis  Neck: No JVD present.  Cardiovascular: Normal rate, regular rhythm and intact distal pulses.  Pulmonary/Chest: Effort normal and breath sounds normal. She has no wheezes. She has no rales.  Abdominal: Soft. Bowel sounds are normal. There is no rebound.  Musculoskeletal:        General: No edema.  Lymphadenopathy:    She has no cervical adenopathy.  Neurological: She is alert and oriented to person, place, and time. No cranial nerve deficit.  Skin: Skin is warm and dry.  Psychiatric: She has a normal mood  and affect.  Nursing note and vitals reviewed.         Assessment & Recommendations:   78 y/o [place] American female with type 2 DM, with persistent dizziness  Dizziness: Mild carotid disease does not explain her symptoms. I will obtain an echocardiogram for completeness-in June 2020 when [place] outbreak is  More settled. However, I do think that will explain her symptoms. While Brain MRI shows chronic microvascular disease and lacunar infarcts, I do not think this explain her symptoms. I will defer it to her PCP if she needs neurology follow up. Consider ENT referral.   Mild carotid stenosis: Continue risk factor modification with aspirin 81 mg daily. Switch pravastatin 10 mg daily to Crestor 10 mg daily.   Follow up as needed   Nigel Mormon, MD [facility] Pager: [phone] Office: [phone] If no answer Cell [phone]

## 2019-02-28 ENCOUNTER — Encounter: Payer: Self-pay | Admitting: Cardiology

## 2019-02-28 DIAGNOSIS — E782 Mixed hyperlipidemia: Secondary | ICD-10-CM | POA: Insufficient documentation

## 2019-02-28 DIAGNOSIS — R42 Dizziness and giddiness: Secondary | ICD-10-CM | POA: Insufficient documentation

## 2019-02-28 DIAGNOSIS — I6381 Other cerebral infarction due to occlusion or stenosis of small artery: Secondary | ICD-10-CM | POA: Insufficient documentation

## 2019-02-28 MED ORDER — ROSUVASTATIN CALCIUM 10 MG PO TABS: 10.0000 mg | ORAL_TABLET | Freq: Every day | ORAL | 3 refills | Status: DC

## 2019-05-07 ENCOUNTER — Other Ambulatory Visit: Payer: Medicare Other

## 2019-07-01 ENCOUNTER — Other Ambulatory Visit (HOSPITAL_COMMUNITY)
Admission: RE | Admit: 2019-07-01 | Discharge: 2019-07-01 | Disposition: A | Discharge status: Home / Self Care | Payer: Medicare Other | Source: Ambulatory Visit | Attending: Nurse Practitioner | Admitting: Nurse Practitioner

## 2019-07-01 ENCOUNTER — Other Ambulatory Visit: Payer: Self-pay

## 2019-07-01 DIAGNOSIS — N898 Other specified noninflammatory disorders of vagina: Secondary | ICD-10-CM | POA: Diagnosis present

## 2019-07-05 LAB — URINE CYTOLOGY ANCILLARY ONLY
Bacterial vaginitis: NEGATIVE
Candida vaginitis: NEGATIVE
Chlamydia: NEGATIVE
Neisseria Gonorrhea: NEGATIVE
Trichomonas: NEGATIVE

## 2019-07-14 ENCOUNTER — Telehealth: Payer: Self-pay | Admitting: Neurology

## 2019-07-14 NOTE — Telephone Encounter (Signed)
Called spoke with patient she was informed of provider response she states that she would like to see another neurologist in our office that could address her Dizziness. I informed patient that she should contact her PCP regarding her dizzy spells. She states that her PCP told her to contact her neurologost regarding her dizziness. Pt states her dizzy spells are only when she standing up.

## 2019-07-14 NOTE — Telephone Encounter (Signed)
Unfortunately, I don't have an answer for her.  Cardiology also just saw her and recommend an ENT for the dizziness.  I haven't seen her in over a year so cannot provide adequate medical advice for her.

## 2019-07-14 NOTE — Telephone Encounter (Signed)
Called spoke with patient  she was informed of provider request

## 2019-07-14 NOTE — Telephone Encounter (Signed)
She will need to talk to her PCP about that.    She has already seen myself and Dr. Leta Baptist in [place].

## 2019-07-14 NOTE — Telephone Encounter (Signed)
Patient was wanting a referral to another neurologist. She did not have any in mind. Please call her back or send referral elsewhere. Thanks!

## 2019-08-11 ENCOUNTER — Telehealth: Payer: Self-pay | Admitting: Diagnostic Neuroimaging

## 2019-08-11 NOTE — Telephone Encounter (Signed)
Patient has been referred back to [place] for the dizziness. She has seen Dr. Leta Baptist in the past, as well as Dr. Carles Collet, but she stated each doctor has given her a different diagnosis, so now she'd like to see Dr. Jannifer Franklin for another opinion. Could you please review and let me know if it's ok for her to switch?

## 2019-08-12 NOTE — Telephone Encounter (Signed)
Ok for switch 

## 2019-09-25 ENCOUNTER — Encounter: Payer: Self-pay | Admitting: Neurology

## 2019-09-25 ENCOUNTER — Other Ambulatory Visit: Payer: Self-pay

## 2019-09-25 ENCOUNTER — Ambulatory Visit (INDEPENDENT_AMBULATORY_CARE_PROVIDER_SITE_OTHER): Payer: Medicare Other | Admitting: Neurology

## 2019-09-25 VITALS — BP 120/68 | HR 78 | Temp 98.3°F | Ht 66.0 in | Wt 122.1 lb

## 2019-09-25 DIAGNOSIS — G250 Essential tremor: Secondary | ICD-10-CM

## 2019-09-25 DIAGNOSIS — I6381 Other cerebral infarction due to occlusion or stenosis of small artery: Secondary | ICD-10-CM | POA: Diagnosis not present

## 2019-09-25 DIAGNOSIS — R42 Dizziness and giddiness: Secondary | ICD-10-CM

## 2019-09-25 DIAGNOSIS — R269 Unspecified abnormalities of gait and mobility: Secondary | ICD-10-CM

## 2019-09-25 HISTORY — DX: Essential tremor: G25.0

## 2019-09-25 MED ORDER — MIRTAZAPINE 15 MG PO TABS: 15.0000 mg | ORAL_TABLET | Freq: Every day | ORAL | 3 refills | Status: DC

## 2019-09-25 NOTE — Progress Notes (Signed)
Reason for visit: Chronic dizziness  Referring physician: Dr. Gabriel Rainwater is a 78 y.o. female  History of present illness:  Ms. Hanberry is a 78 year old right-handed black female with a history of chronic dizziness.  The patient has been seen by Dr. Leta Baptist and by Dr. Carles Collet previously.  There was some concerns for Parkinson's disease, the patient was actually placed on Sinemet but ultimately was felt that the patient did not have Parkinson's disease and the Sinemet was discontinued.  The patient does have tremors that affect both upper extremities, mainly on the right side.  The tremors are associated with activity of the arms, not at rest.  The patient indicates that she had relatively sudden onset of dizziness that occurred in the summer 2017.  She claimed that she woke up with dizziness and has been persistent ever since without clear worsening.  The patient claims that she is not dizzy lying down, when she sits up she has slight dizziness but it is very minimal, and when she stands up the dizziness is quite significant.  The patient indicates that she may touch a table or chair and this makes no difference with the dizziness, she does have a cane to use at times.  She has not had any falls.  She reports numbness in both feet, she has some burning and stinging in the feet at night, she does have a history of diabetes.  Over the last 2 or 3 months, she has lost about 8 pounds of weight, her appetite has reduced, she has had some problems with depression.  She reports in the past that use of alprazolam has helped the sensation of dizziness some but it has not eliminated the dizziness.  She at times may feel weak in the legs.  She again denies any blackout spells.  She has not been shown to have orthostatic hypotension.  MRI of the brain showed minimal small vessel disease previously.  She comes to this office for further evaluation.  Past Medical History:  Diagnosis Date  . Exotropia of  right eye    AND HYPERTROPIA  . Glaucoma   . Hyperlipidemia   . Hypertension   . Incomplete right bundle branch block   . LAFB (left anterior fascicular block)   . SUI (stress urinary incontinence, female)   . Type 2 diabetes mellitus ([place])   . Wears dentures    upper denture and lower partial denture  . Wears glasses     Past Surgical History:  Procedure Laterality Date  . ADJUSTABLE SUTURE MANIPULATION Right 03/29/2016   Procedure: ADJUSTABLE SUTURE RIGHT EYE LATERAL RECTUS RESECTION RIGHT EYE ;  Surgeon: Gevena Cotton, MD;  Location: [facility];  Service: Ophthalmology;  Laterality: Right;  . CHOLECYSTECTOMY OPEN  1968  . EYE SURGERY  age 14   repair right eye injury  . GLAUCOMA SURGERY Right 08-23-2015  at [facility]  . MEDIAN RECTUS REPAIR Right 03/29/2016   Procedure: MEDIAN RECTUS RESECTION RIGHT EYE ADJUSTABLE SUTURE RIGHT EYE LATERAL RECTUS RESECTION RIGHT EYE ;  Surgeon: Gevena Cotton, MD;  Location: [facility];  Service: Ophthalmology;  Laterality: Right;    Family History  Problem Relation Age of Onset  . Emphysema Father   . Heart failure Father   . Heart failure Sister   . COPD Brother     Social history:  reports that she has been smoking cigarettes. She has a 2.50 pack-year smoking history. She has never used smokeless  tobacco. She reports current alcohol use. She reports that she does not use drugs.  Medications:  Prior to Admission medications   Medication Sig Start Date End Date Taking? Authorizing Provider  cycloSPORINE (RESTASIS) 0.05 % ophthalmic emulsion 1 drop 2 (two) times daily.   Yes [provider]  lisinopril (ZESTRIL) 5 MG tablet Take 5 mg by mouth daily.   Yes [provider]  metFORMIN (GLUCOPHAGE) 500 MG tablet Take 500 mg by mouth 2 (two) times daily. 09/05/19  Yes [provider]  Multiple Vitamin (MULTIVITAMIN) tablet Take 1 tablet by mouth daily.   Yes [provider]   rosuvastatin (CRESTOR) 10 MG tablet Take 1 tablet (10 mg total) by mouth daily. 02/28/19 05/29/19  Patwardhan, Reynold Bowen, MD     No Known Allergies  ROS:  Out of a complete 14 system review of symptoms, the patient complains only of the following symptoms, and all other reviewed systems are negative.  Dizziness Walking difficulty Anxiety, depression Numbness in the feet  Blood pressure 120/68, pulse 78, temperature 98.3 F (36.8 C), temperature source Temporal, height 5\' 6"  (1.676 m), weight 122 lb 2 oz (55.4 kg).   Blood pressure, right arm, sitting is 128/72.  Blood pressure, right arm, standing is 126/68.  Physical Exam  General: The patient is alert and cooperative at the time of the examination.  Eyes: Pupil on the left is round and reactive.  Discs are flat on the left.   There is a corneal opacity with the right eye.  Neck: The neck is supple, no carotid bruits are noted.  Respiratory: The respiratory examination is clear.  Cardiovascular: The cardiovascular examination reveals a regular rate and rhythm, no obvious murmurs or rubs are noted.  Skin: Extremities are without significant edema.  Neurologic Exam  Mental status: The patient is alert and oriented x 3 at the time of the examination. The patient has apparent normal recent and remote memory, with an apparently normal attention span and concentration ability.  Cranial nerves: Facial symmetry is present. There is good sensation of the face to pinprick and soft touch bilaterally. The strength of the facial muscles and the muscles to head turning and shoulder shrug are normal bilaterally. Speech is well enunciated, no aphasia or dysarthria is noted. Extraocular movements are full. Visual fields are full. The tongue is midline, and the patient has symmetric elevation of the soft palate. No obvious hearing deficits are noted.  Motor: The motor testing reveals 5 over 5 strength of all 4 extremities. Good symmetric motor  tone is noted throughout.  Sensory: Sensory testing is notable for a decrease in pinprick and vibration sensation on the left arm and leg as compared to the right, position sense is intact on all 4 extremities.  There is a stocking pattern pinprick sensory deficit in the distal third of the legs bilaterally. No evidence of extinction is noted.  Coordination: Cerebellar testing reveals good finger-nose-finger and heel-to-shin bilaterally.  Gait and station: Gait is minimally wide-based, the patient walk independently.  Tandem gait is slightly unsteady.  Romberg is negative. No drift is seen.  Reflexes: Deep tendon reflexes are symmetric and normal bilaterally, with exception of depressed ankle jerk reflexes bilaterally. Toes are downgoing bilaterally.   Assessment/Plan:  1.  Chronic dizziness, gait instability  2.  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  Anxiety and depression  The patient appears to have some component of anxiety associated with her dizziness.  Xanax in the past has apparently  been helpful.  The patient has reported recent chronic insomnia and weight loss, we will add mirtazapine 15 mg at night.  This hopefully may help anxiety issues during the day as well, the patient will call for dose adjustments.  I will set her up for physical therapy for gait training.  The patient will be set up for a carotid Doppler study.  The presence of a peripheral neuropathy sometimes can cause a sensation of imbalance when the patient is ambulating, but the patient does not have a history consistent with this.  She is blind in the right eye, this may affect depth perception again leading to the sensation of dizziness or instability with walking.  The patient is not orthostatic today although she reports postural dizziness.  The presence of anxiety may heighten this sensation of dizziness with walking.  She will follow-up here in 4 months.  Jill Alexanders MD 09/25/2019 8:41 AM   [facility] [address] [place], [place] [address]  Phone [phone] Fax [phone]

## 2019-09-25 NOTE — Patient Instructions (Signed)
We will start Mirtazepine 15 mg at night for sleep and for appetite.

## 2019-09-29 ENCOUNTER — Other Ambulatory Visit: Payer: Self-pay

## 2019-09-29 ENCOUNTER — Encounter: Payer: Self-pay | Admitting: Physical Therapy

## 2019-09-29 ENCOUNTER — Ambulatory Visit: Payer: Medicare Other | Attending: Neurology | Admitting: Physical Therapy

## 2019-09-29 VITALS — BP 128/65 | HR 102

## 2019-09-29 DIAGNOSIS — R2689 Other abnormalities of gait and mobility: Secondary | ICD-10-CM | POA: Insufficient documentation

## 2019-09-29 DIAGNOSIS — R2681 Unsteadiness on feet: Secondary | ICD-10-CM | POA: Diagnosis present

## 2019-09-29 DIAGNOSIS — R42 Dizziness and giddiness: Secondary | ICD-10-CM | POA: Insufficient documentation

## 2019-09-30 NOTE — Therapy (Signed)
Katherine [name] [address] [place], [place], [address] Phone: [phone]   Fax:  [phone]  Physical Therapy Evaluation  Patient Details  Name: Katherine [name] MRN: [id number] Date of Birth: Mar 29, 1941 Referring Provider (PT): Dr. Floyde Parkins   Encounter Date: 09/29/2019  PT End of Session - 09/30/19 1124    Visit Number  1    Number of Visits  9    Date for PT Re-Evaluation  10/29/19    Authorization Type  Aetna Managed/Medicare    Authorization Time Period  09-29-19 - 11-29-19    PT Start Time  1025   pt arrived 10" late for eval   PT Stop Time  1100    PT Time Calculation (min)  35 min    Activity Tolerance  Patient tolerated treatment well    Behavior During Therapy  [facility] for tasks assessed/performed       Past Medical History:  Diagnosis Date  . Essential tremor 09/25/2019  . Exotropia of right eye    AND HYPERTROPIA  . Glaucoma   . Hyperlipidemia   . Hypertension   . Incomplete right bundle branch block   . LAFB (left anterior fascicular block)   . SUI (stress urinary incontinence, female)   . Type 2 diabetes mellitus ([place])   . Wears dentures    upper denture and lower partial denture  . Wears glasses     Past Surgical History:  Procedure Laterality Date  . ADJUSTABLE SUTURE MANIPULATION Right 03/29/2016   Procedure: ADJUSTABLE SUTURE RIGHT EYE LATERAL RECTUS RESECTION RIGHT EYE ;  Surgeon: Gevena Cotton, MD;  Location: [facility];  Service: Ophthalmology;  Laterality: Right;  . CHOLECYSTECTOMY OPEN  1968  . EYE SURGERY  age 51   repair right eye injury  . GLAUCOMA SURGERY Right 08-23-2015  at [facility]  . MEDIAN RECTUS REPAIR Right 03/29/2016   Procedure: MEDIAN RECTUS RESECTION RIGHT EYE ADJUSTABLE SUTURE RIGHT EYE LATERAL RECTUS RESECTION RIGHT EYE ;  Surgeon: Gevena Cotton, MD;  Location: [facility];  Service: Ophthalmology;  Laterality: Right;    Vitals:    09/29/19 1051 09/29/19 1053  BP: 132/69 128/65  Pulse: 98 (!) 102         [facility] PT Assessment - 09/30/19 0001      Assessment   Medical Diagnosis  Vertigo    Referring Provider (PT)  Dr. Floyde Parkins    Prior Therapy  April 2018 - 3 visits only at this facility      Precautions   Precautions  Other (comment)   Vertigo     Restrictions   Weight Bearing Restrictions  No      Balance Screen   Has the patient fallen in the past 6 months  No    Has the patient had a decrease in activity level because of a fear of falling?   No    Is the patient reluctant to leave their home because of a fear of falling?   No      Prior Function   Level of Independence  Independent      Ambulation/Gait   Ambulation/Gait  Yes    Ambulation/Gait Assistance  6: Modified independent (Device/Increase time)    Ambulation Distance (Feet)  75 Feet    Assistive device  None    Gait Pattern  Step-through pattern   some unsteadiness - varies depending on vertigo   Ambulation Surface  Level;Indoor  Vestibular Assessment - 09/30/19 0001      Vestibular Assessment   General Observation  pt is a 78 yr old lady with c/o vertigo that started in early 2018;  pt states cause of  dizziness is unknown - states she doesn't understand why the MD will not give her something for her dizziness      Symptom Behavior   Subjective history of current problem  Pt states vertigo started in 2018 - had few visits of PT in April 2018 but states it did not help; pt reports her gait is unsteady due to the dizziness    Type of Dizziness   Lightheadedness    Frequency of Dizziness  Daily    Duration of Dizziness  constant but varies in intensity    Symptom Nature  Constant    Aggravating Factors  Supine to sit;Sit to stand    Relieving Factors  Rest    Progression of Symptoms  Worse    History of similar episodes  pt states vertigo started in early 2018      Oculomotor Exam   Oculomotor Alignment  Normal     Spontaneous  Absent    Smooth Pursuits  Intact    Saccades  Intact    Comment  pt has glaucoma and also exotropia Rt eye      Oculomotor Exam-Fixation Suppressed    Ocular Alignment  normal      Positional Testing   Sidelying Test  Sidelying Right;Sidelying Left      Sidelying Right   Sidelying Right Duration  none    Sidelying Right Symptoms  No nystagmus      Sidelying Left   Sidelying Left Duration  none    Sidelying Left Symptoms  No nystagmus      Positional Sensitivities   Supine to Sitting  Lightheadedness    Positional Sensitivities Comments  pt reports lightheadedness of approx. same intensity with sidelying Rt and Lt to sitting position          Objective measurements completed on examination: See above findings.                   PT Long Term Goals - 09/30/19 1139      PT LONG TERM GOAL #1   Title  Pt will be independent in HEP for balance and vestibular exercises.    Time  4    Period  Weeks    Status  New    Target Date  10/29/19      PT LONG TERM GOAL #2   Title  Pt will report decreased dizziness from 7/10 to </= 4/10 intensity during ambulation.    Time  4    Period  Weeks    Status  New    Target Date  10/29/19      PT LONG TERM GOAL #3   Title  Improve TUG score to </= 13.5 secs for reduced fall risk (with no device).    Time  4    Period  Weeks    Status  New    Target Date  10/29/19             Plan - 09/30/19 1125    Clinical Impression Statement  Pt is a 78 yr old lady with c/o chronic dizziness since early 2018; pt reports she has most dizziness when she is standing or walking.  Pt reports no dizziness with lying down or sitting, but does report some dizziness with  sidelying to sitting and with sit to stand transfers.  No oculomotor abnormalities noted as smooth pursuits and saccades are WNL's.  Pt did report minimal dizziness with horizontal head turns in seated position, however pt has exotropia in Rt eye and  glaucoma which could impact visual acuity and gaze stabilization.  Pt is not using an assistive device, but states she has a SPC at home - states she prefers to not use assistive device at this time.  Pt amb. with unsteady gait, which she attributes to the dizziness in standing.  Pt states she came for PT at this facility in April 2018 and reports it did not help; states she came to eval because it was ordered by Dr. Jannifer Franklin.  BP was checked in seated position and then in standing; pt's BP readings did not indicate orthostatic hypotension, but pt did have increased heart rate (102 bpm) upon initial standing.    Personal Factors and Comorbidities  Comorbidity 2;Time since onset of injury/illness/exacerbation    Comorbidities  essential tremor;  Type 2 DM:  HTN; glaucoma; exotropia Rt eye    Examination-Activity Limitations  Locomotion Level;Stand;Squat;Transfers    Examination-Participation Restrictions  Cleaning;Meal Prep;Community Activity;Interpersonal Relationship;Shop;Driving;Laundry    Stability/Clinical Decision Making  Evolving/Moderate complexity    Clinical Decision Making  Moderate    Rehab Potential  Good    PT Frequency  2x / week    PT Duration  4 weeks   may renew - depending on progress made in initial 4 weeks   PT Treatment/Interventions  ADLs/Self Care Home Management;Gait training;Stair training;Therapeutic activities;Therapeutic exercise;Balance training;Neuromuscular re-education;DME Instruction;Patient/family education;Vestibular    PT Next Visit Plan  Balance HEP; do orthostatics assessment - instruct in use of SPC for assistance with amb. - balance on foam    PT Home Exercise Plan  balance    Consulted and Agree with Plan of Care  Patient       Patient will benefit from skilled therapeutic intervention in order to improve the following deficits and impairments:  Difficulty walking, Dizziness, Decreased balance, Impaired sensation, Impaired vision/preception  Visit  Diagnosis: Dizziness and giddiness - Plan: PT plan of care cert/re-cert  Other abnormalities of gait and mobility - Plan: PT plan of care cert/re-cert  Unsteadiness on feet - Plan: PT plan of care cert/re-cert     Problem List Patient Active Problem List   Diagnosis Date Noted  . Essential tremor 09/25/2019  . Dizziness 02/28/2019  . Mixed hyperlipidemia 02/28/2019  . Lacunar stroke ([place]) 02/28/2019  . Lightheadedness 02/28/2019    DildayJenness Corner, PT 09/30/2019, 8:33 PM  [facility] [address] [place], [place], [address] Phone: [phone]   Fax:  [phone]  Name: Nickita Kuenzi MRN: [id number] Date of Birth: Aug 01, 1941

## 2019-10-03 ENCOUNTER — Other Ambulatory Visit: Payer: Self-pay

## 2019-10-03 ENCOUNTER — Ambulatory Visit (HOSPITAL_COMMUNITY)
Admission: RE | Admit: 2019-10-03 | Discharge: 2019-10-03 | Disposition: A | Discharge status: Home / Self Care | Payer: Medicare Other | Source: Ambulatory Visit | Attending: Neurology | Admitting: Neurology

## 2019-10-03 DIAGNOSIS — R42 Dizziness and giddiness: Secondary | ICD-10-CM | POA: Insufficient documentation

## 2019-10-03 NOTE — Progress Notes (Signed)
Carotid duplex has been completed.   Preliminary results in CV Proc.   Abram Sander 10/03/2019 2:21 PM

## 2019-10-04 ENCOUNTER — Telehealth: Payer: Self-pay | Admitting: Neurology

## 2019-10-04 MED ORDER — MIRTAZAPINE 30 MG PO TABS: 30.0000 mg | ORAL_TABLET | Freq: Every day | ORAL | 3 refills | Status: DC

## 2019-10-04 NOTE — Telephone Encounter (Signed)
  I called the patient.  The carotid Doppler study is unremarkable, she is still having dizziness on the 15 mg mirtazapine tablet, we will go up to 30 mg at night.  In the past, alprazolam helped her with her dizziness.   Carotid doppler 10/03/19:  Summary: Right Carotid: Velocities in the right ICA are consistent with a 1-39% stenosis.  Left Carotid: Velocities in the left ICA are consistent with a 1-39% stenosis.  Vertebrals: Bilateral vertebral arteries demonstrate antegrade flow.

## 2019-10-06 ENCOUNTER — Institutional Professional Consult (permissible substitution): Payer: Medicare Other | Admitting: Neurology

## 2019-10-21 ENCOUNTER — Other Ambulatory Visit: Payer: Self-pay

## 2019-10-21 ENCOUNTER — Ambulatory Visit: Payer: Medicare Other | Attending: Neurology | Admitting: Physical Therapy

## 2019-10-21 ENCOUNTER — Encounter: Payer: Self-pay | Admitting: Physical Therapy

## 2019-10-21 VITALS — BP 129/54 | HR 87

## 2019-10-21 DIAGNOSIS — R2689 Other abnormalities of gait and mobility: Secondary | ICD-10-CM | POA: Diagnosis present

## 2019-10-21 DIAGNOSIS — R2681 Unsteadiness on feet: Secondary | ICD-10-CM | POA: Diagnosis present

## 2019-10-21 DIAGNOSIS — R42 Dizziness and giddiness: Secondary | ICD-10-CM | POA: Insufficient documentation

## 2019-10-22 NOTE — Therapy (Signed)
[facility] [address] [place], [place], [address] Phone: [phone]   Fax:  [phone]  Physical Therapy Treatment  Patient Details  Name: Katherine [name] MRN: [id number] Date of Birth: Jul 27, 1941 Referring Provider (PT): Dr. Floyde Parkins   Encounter Date: 10/21/2019  PT End of Session - 10/22/19 2201    Visit Number  2    Number of Visits  9    Date for PT Re-Evaluation  10/29/19    Authorization Type  Aetna Managed/Medicare    Authorization Time Period  09-29-19 - 11-29-19    PT Start Time  1700    PT Stop Time  1750    PT Time Calculation (min)  50 min    Activity Tolerance  Patient tolerated treatment well    Behavior During Therapy  [facility] for tasks assessed/performed       Past Medical History:  Diagnosis Date  . Essential tremor 09/25/2019  . Exotropia of right eye    AND HYPERTROPIA  . Glaucoma   . Hyperlipidemia   . Hypertension   . Incomplete right bundle branch block   . LAFB (left anterior fascicular block)   . SUI (stress urinary incontinence, female)   . Type 2 diabetes mellitus ([place])   . Wears dentures    upper denture and lower partial denture  . Wears glasses     Past Surgical History:  Procedure Laterality Date  . ADJUSTABLE SUTURE MANIPULATION Right 03/29/2016   Procedure: ADJUSTABLE SUTURE RIGHT EYE LATERAL RECTUS RESECTION RIGHT EYE ;  Surgeon: Gevena Cotton, MD;  Location: [facility];  Service: Ophthalmology;  Laterality: Right;  . CHOLECYSTECTOMY OPEN  1968  . EYE SURGERY  age 83   repair right eye injury  . GLAUCOMA SURGERY Right 08-23-2015  at [facility]  . MEDIAN RECTUS REPAIR Right 03/29/2016   Procedure: MEDIAN RECTUS RESECTION RIGHT EYE ADJUSTABLE SUTURE RIGHT EYE LATERAL RECTUS RESECTION RIGHT EYE ;  Surgeon: Gevena Cotton, MD;  Location: [facility];  Service: Ophthalmology;  Laterality: Right;    Vitals:   10/21/19 1723  BP: (!) 129/54   Pulse: 87    Subjective Assessment - 10/21/19 1715    Subjective  Pt states dizziness is about the same as it was at time of eval in mid-Nov.; states Dr. Jannifer Franklin increased dosage of her sleeping medication (mirtazapine) but says it does not help with the dizziness - just with sleeping    Pertinent History  h/o vertigo    Patient Stated Goals  pt wants one person "which will work with me during my duration of PT - doesn't want to talk for 30" of the 45" minute session"    Currently in Pain?  No/denies             Vestibular Assessment - 10/22/19 0001      Orthostatics   BP supine (x 5 minutes)  138/59    HR supine (x 5 minutes)  82    BP sitting  132/61   c/o slight lightheadedness with supine to sit132   HR sitting  85    BP standing (after 1 minute)  138/59    HR standing (after 1 minute)  91    BP standing (after 3 minutes)  133/56    HR standing (after 3 minutes)  89    Orthostatics Comment  slight dizziness reported at end of assessment       Positional testing - pt transferred sit to  Rt and Lt sidelying; no c/o dizziness in sidelying position, but c/o dizziness with  Return to upright position                  PT Education - 10/22/19 2201    Education Details  discussed pt's symptoms - appears to be related to BP or cardiac but pt states this has been ruled out    Person(s) Educated  Patient    Methods  Explanation    Comprehension  Verbalized understanding          PT Long Term Goals - 10/22/19 2205      PT LONG TERM GOAL #1   Title  Pt will be independent in HEP for balance and vestibular exercises.    Time  4    Period  Weeks    Status  New      PT LONG TERM GOAL #2   Title  Pt will report decreased dizziness from 7/10 to </= 4/10 intensity during ambulation.    Time  4    Period  Weeks    Status  New      PT LONG TERM GOAL #3   Title  Improve TUG score to </= 13.5 secs for reduced fall risk (with no device).    Time  4    Period   Weeks    Status  New            Plan - 10/22/19 2202    Clinical Impression Statement  Pt continues to c/o dizziness with standing and walking; pt becomes light-headed with sit to stand and is unsteady with amb., placing her at risk for fall.  BP readings do not indicate orthostatic hypotension at this time.  Etiology of dizziness is unknown at this time.    Personal Factors and Comorbidities  Comorbidity 2;Time since onset of injury/illness/exacerbation    Comorbidities  essential tremor;  Type 2 DM:  HTN; glaucoma; exotropia Rt eye    Examination-Activity Limitations  Locomotion Level;Stand;Squat;Transfers    Examination-Participation Restrictions  Cleaning;Meal Prep;Community Activity;Interpersonal Relationship;Shop;Driving;Laundry    Stability/Clinical Decision Making  Evolving/Moderate complexity    Rehab Potential  Good    PT Frequency  2x / week    PT Duration  4 weeks   may renew - depending on progress made in initial 4 weeks   PT Treatment/Interventions  ADLs/Self Care Home Management;Gait training;Stair training;Therapeutic activities;Therapeutic exercise;Balance training;Neuromuscular re-education;DME Instruction;Patient/family education;Vestibular    PT Next Visit Plan  do SOT - balance on foam - D/C next session?    PT Home Exercise Plan  balance    Consulted and Agree with Plan of Care  Patient       Patient will benefit from skilled therapeutic intervention in order to improve the following deficits and impairments:  Difficulty walking, Dizziness, Decreased balance, Impaired sensation, Impaired vision/preception  Visit Diagnosis: Dizziness and giddiness  Other abnormalities of gait and mobility  Unsteadiness on feet     Problem List Patient Active Problem List   Diagnosis Date Noted  . Essential tremor 09/25/2019  . Dizziness 02/28/2019  . Mixed hyperlipidemia 02/28/2019  . Lacunar stroke ([place]) 02/28/2019  . Lightheadedness 02/28/2019    DildayJenness Corner, PT 10/22/2019, 10:07 PM  [facility] [address] [place], [place], [address] Phone: [phone]   Fax:  [phone]  Name: Katherine [name] MRN: [id number] Date of Birth: 05/29/1941

## 2019-10-28 ENCOUNTER — Ambulatory Visit: Payer: Medicare Other | Admitting: Physical Therapy

## 2019-10-28 ENCOUNTER — Encounter: Payer: Self-pay | Admitting: Physical Therapy

## 2019-10-28 ENCOUNTER — Other Ambulatory Visit: Payer: Self-pay

## 2019-10-28 DIAGNOSIS — R42 Dizziness and giddiness: Secondary | ICD-10-CM

## 2019-10-29 NOTE — Patient Instructions (Signed)
Gave pt standing on pillow with EO and EC with feet apart and then feet together Sheet with 4 positions - added horizontal & vertical head turns

## 2019-10-29 NOTE — Therapy (Signed)
[facility] [address] [place], [place], [address] Phone: [phone]   Fax:  [phone]  Physical Therapy Treatment & Discharge Summary  Patient Details  Name: Katherine [name] MRN: [id number] Date of Birth: [birth year] Referring Provider (PT): Dr. Floyde Parkins   Encounter Date: 10/28/2019  PT End of Session - 10/29/19 2140    Visit Number  3    Number of Visits  9    Date for PT Re-Evaluation  10/29/19    Authorization Type  Aetna Managed/Medicare    Authorization Time Period  09-29-19 - 11-29-19    PT Start Time  1701    PT Stop Time  1755    PT Time Calculation (min)  54 min    Activity Tolerance  Patient tolerated treatment well    Behavior During Therapy  [facility] for tasks assessed/performed       Past Medical History:  Diagnosis Date  . Essential tremor 09/25/2019  . Exotropia of right eye    AND HYPERTROPIA  . Glaucoma   . Hyperlipidemia   . Hypertension   . Incomplete right bundle branch block   . LAFB (left anterior fascicular block)   . SUI (stress urinary incontinence, female)   . Type 2 diabetes mellitus ([place])   . Wears dentures    upper denture and lower partial denture  . Wears glasses     Past Surgical History:  Procedure Laterality Date  . ADJUSTABLE SUTURE MANIPULATION Right 03/29/2016   Procedure: ADJUSTABLE SUTURE RIGHT EYE LATERAL RECTUS RESECTION RIGHT EYE ;  Surgeon: Gevena Cotton, MD;  Location[facility];  Service: Ophthalmology;  Laterality: Right;  . CHOLECYSTECTOMY OPEN  1968  . EYE SURGERY  age 30   repair right eye injury  . GLAUCOMA SURGERY Right 08-23-2015  at [facility]  . MEDIAN RECTUS REPAIR Right 03/29/2016   Procedure: MEDIAN RECTUS RESECTION RIGHT EYE ADJUSTABLE SUTURE RIGHT EYE LATERAL RECTUS RESECTION RIGHT EYE ;  Surgeon: Gevena Cotton, MD;  Location: [facility];  Service: Ophthalmology;  Laterality: Right;    There were no vitals filed  for this visit.  Subjective Assessment - 10/28/19 1703    Subjective  Pt reports pain in her left side - states it started yesterday; dizziness is about the same as it was at previous PT session    Pertinent History  h/o vertigo    Patient Stated Goals  pt wants one person "which will work with me during my duration of PT - doesn't want to talk for 30" of the 45" minute session"    Currently in Pain?  Yes    Pain Score  2     Pain Orientation  Left    Pain Descriptors / Indicators  Sore;Dull    Pain Type  Acute pain    Pain Onset  In the past 7 days    Pain Frequency  Constant    Aggravating Factors   unsure    Pain Relieving Factors  nothing         Sensory Organization Test:  Composite score 52/100 with N= 68/100  Condition 1 - all 3 trials WNL's Condition 2 - all 3 trials WNL's Condition 3 - all 3 trials WNL's Condition 4 - trial 1 slightly decreased;  Trials 2 & 3 WNL's Condition 5 - FALL on trials 1 & 3:  Trial 2 WNL's (EC with moving floor) Condition 6 - FALL on all 3 trials (EO with moving surround &  floor)   Somatosensory & visual inputs WNL's  Vestibular input score 22/100 with N= 50/100  Pt was given copy of this test; her results and scores were explained to her and she verbalized understanding                Balance Exercises - 10/29/19 2138      Balance Exercises: Standing   Standing Eyes Opened  Narrow base of support (BOS);Wide (BOA);Head turns;Foam/compliant surface;5 reps    Standing Eyes Closed  Narrow base of support (BOS);Wide (BOA);Head turns;Foam/compliant surface;5 reps        PT Education - 10/29/19 2139    Education Details  balance on foam with EO and EC - head turns    Person(s) Educated  Patient    Methods  Explanation;Demonstration;Handout    Comprehension  Verbalized understanding;Returned demonstration          PT Long Term Goals - 10/29/19 2142      PT LONG TERM GOAL #1   Title  Pt will be independent in HEP for  balance and vestibular exercises.    Baseline  met 10-28-19    Time  4    Period  Weeks    Status  Achieved      PT LONG TERM GOAL #2   Title  Pt will report decreased dizziness from 7/10 to </= 4/10 intensity during ambulation.    Baseline  pt reports no change in dizziness - 10-28-19    Time  4    Period  Weeks    Status  Not Met      PT LONG TERM GOAL #3   Title  Improve TUG score to </= 13.5 secs for reduced fall risk (with no device).    Time  4    Period  Weeks    Status  Deferred            Plan - 10/29/19 2140    Clinical Impression Statement  Pt has decreased vestibular input in maintaining balance per SOT, with visual & somatosensory inputs WNL's per SOT.  Pt reports no change in dizziness - requests D/C with her plan to do HEP and follow up with MD. Pt states "he should be able to give me something for this dizziness".    Personal Factors and Comorbidities  Comorbidity 2;Time since onset of injury/illness/exacerbation    Comorbidities  essential tremor;  Type 2 DM:  HTN; glaucoma; exotropia Rt eye    Examination-Activity Limitations  Locomotion Level;Stand;Squat;Transfers    Examination-Participation Restrictions  Cleaning;Meal Prep;Community Activity;Interpersonal Relationship;Shop;Driving;Laundry    Stability/Clinical Decision Making  Evolving/Moderate complexity    Rehab Potential  Good    PT Frequency  2x / week    PT Duration  4 weeks   may renew - depending on progress made in initial 4 weeks   PT Treatment/Interventions  ADLs/Self Care Home Management;Gait training;Stair training;Therapeutic activities;Therapeutic exercise;Balance training;Neuromuscular re-education;DME Instruction;Patient/family education;Vestibular    PT Next Visit Plan  do SOT - balance on foam - D/C next session?    PT Home Exercise Plan  balance    Consulted and Agree with Plan of Care  Patient       Patient will benefit from skilled therapeutic intervention in order to improve the  following deficits and impairments:  Difficulty walking, Dizziness, Decreased balance, Impaired sensation, Impaired vision/preception  Visit Diagnosis: Dizziness and giddiness     Problem List Patient Active Problem List   Diagnosis Date Noted  . Essential tremor 09/25/2019  .  Dizziness 02/28/2019  . Mixed hyperlipidemia 02/28/2019  . Lacunar stroke ([place]) 02/28/2019  . Lightheadedness 02/28/2019    PHYSICAL THERAPY DISCHARGE SUMMARY  Visits from Start of Care: 3  Current functional level related to goals / functional outcomes: See above for progress towards goals - pt reports no change in her dizziness and requests D/C with her plan being to do HEP at home   Remaining deficits: Cont. C/o dizziness - pt states no change   Education / Equipment: Pt has been instructed in HEP consisting of balance/vestibular exercises - pt states she will comply and perform at home independently  Plan: Patient agrees to discharge.  Patient goals were not met. Patient is being discharged due to the patient's request.  ?????        Alda Lea, PT 10/29/2019, 9:45 PM  [facility] [address] [place], [place], [address] Phone: [phone]   Fax:  [phone]  Name: Katherine [name] MRN: [id number] Date of Birth: [birth year]

## 2019-11-04 ENCOUNTER — Encounter: Payer: Medicare Other | Admitting: Physical Therapy

## 2020-01-17 ENCOUNTER — Emergency Department (HOSPITAL_COMMUNITY): Payer: Medicare Other

## 2020-01-17 ENCOUNTER — Encounter (HOSPITAL_COMMUNITY): Payer: Self-pay

## 2020-01-17 ENCOUNTER — Other Ambulatory Visit: Payer: Self-pay

## 2020-01-17 ENCOUNTER — Emergency Department (HOSPITAL_COMMUNITY)
Admission: EM | Admit: 2020-01-17 | Discharge: 2020-01-17 | Disposition: A | Discharge status: Home / Self Care | Payer: Medicare Other | Attending: Emergency Medicine | Admitting: Emergency Medicine

## 2020-01-17 DIAGNOSIS — S82841A Displaced bimalleolar fracture of right lower leg, initial encounter for closed fracture: Secondary | ICD-10-CM | POA: Insufficient documentation

## 2020-01-17 DIAGNOSIS — Y929 Unspecified place or not applicable: Secondary | ICD-10-CM | POA: Diagnosis not present

## 2020-01-17 DIAGNOSIS — F17210 Nicotine dependence, cigarettes, uncomplicated: Secondary | ICD-10-CM | POA: Diagnosis not present

## 2020-01-17 DIAGNOSIS — M79673 Pain in unspecified foot: Secondary | ICD-10-CM

## 2020-01-17 DIAGNOSIS — Y9389 Activity, other specified: Secondary | ICD-10-CM | POA: Insufficient documentation

## 2020-01-17 DIAGNOSIS — S82831A Other fracture of upper and lower end of right fibula, initial encounter for closed fracture: Secondary | ICD-10-CM | POA: Diagnosis not present

## 2020-01-17 DIAGNOSIS — W010XXA Fall on same level from slipping, tripping and stumbling without subsequent striking against object, initial encounter: Secondary | ICD-10-CM | POA: Insufficient documentation

## 2020-01-17 DIAGNOSIS — I10 Essential (primary) hypertension: Secondary | ICD-10-CM | POA: Insufficient documentation

## 2020-01-17 DIAGNOSIS — Z8673 Personal history of transient ischemic attack (TIA), and cerebral infarction without residual deficits: Secondary | ICD-10-CM | POA: Insufficient documentation

## 2020-01-17 DIAGNOSIS — Z7984 Long term (current) use of oral hypoglycemic drugs: Secondary | ICD-10-CM | POA: Insufficient documentation

## 2020-01-17 DIAGNOSIS — Y998 Other external cause status: Secondary | ICD-10-CM | POA: Diagnosis not present

## 2020-01-17 DIAGNOSIS — S99921A Unspecified injury of right foot, initial encounter: Secondary | ICD-10-CM | POA: Diagnosis present

## 2020-01-17 DIAGNOSIS — S82839A Other fracture of upper and lower end of unspecified fibula, initial encounter for closed fracture: Secondary | ICD-10-CM

## 2020-01-17 MED ORDER — ACETAMINOPHEN 325 MG PO TABS
650.0000 mg | ORAL_TABLET | Freq: Once | ORAL | Status: AC
  Administered 2020-01-17: 650 mg via ORAL
  Filled 2020-01-17: qty 2

## 2020-01-17 NOTE — ED Triage Notes (Signed)
Pt reports she tripped and fell yesterday. Pt now endorses right foot pain and swelling. Pt denies any other injuries or hitting her head.

## 2020-01-17 NOTE — Discharge Instructions (Signed)
You may alternate taking Tylenol and Ibuprofen as needed for pain control. You may take 400-600 mg of ibuprofen every 6 hours and [phone] mg of Tylenol every 6 hours. Do not exceed 4000 mg of Tylenol daily as this can lead to liver damage. Also, make sure to take Ibuprofen with meals as it can cause an upset stomach. Do not take other NSAIDs while taking Ibuprofen such as (Aleve, Naprosyn, Aspirin, Celebrex, etc) and do not take more than the prescribed dose as this can lead to ulcers and bleeding in your GI tract. You may use warm and cold compresses to help with your symptoms.   Please follow up with Dr. Griffin Basil within the next 7-10 days for re-evaluation and further treatment of your symptoms.   Please return to the ER sooner if you have any new or worsening symptoms.

## 2020-01-17 NOTE — ED Provider Notes (Signed)
[place] DEPT Provider Note   CSN: [id number] Arrival date & time: 01/17/20  1104     History Chief Complaint  Patient presents with  . Foot Injury  . Fall    Katherine [name] is a 79 y.o. female.  HPI   79 year old female with history of essential tremor, hyperlipidemia, hypertension, diabetes, who presents emergency department today for evaluation after a fall.  States that she had a mechanical fall where she tripped and rolled her ankle yesterday.  She has pain to the right ankle and foot with some associated swelling. She has been using some crutches at home. She denies any other injuries including no head trauma or loc. Denies numbness to the foot.   Past Medical History:  Diagnosis Date  . Essential tremor 09/25/2019  . Exotropia of right eye    AND HYPERTROPIA  . Glaucoma   . Hyperlipidemia   . Hypertension   . Incomplete right bundle branch block   . LAFB (left anterior fascicular block)   . SUI (stress urinary incontinence, female)   . Type 2 diabetes mellitus ([place])   . Wears dentures    upper denture and lower partial denture  . Wears glasses     Patient Active Problem List   Diagnosis Date Noted  . Essential tremor 09/25/2019  . Dizziness 02/28/2019  . Mixed hyperlipidemia 02/28/2019  . Lacunar stroke ([place]) 02/28/2019  . Lightheadedness 02/28/2019    Past Surgical History:  Procedure Laterality Date  . ADJUSTABLE SUTURE MANIPULATION Right 03/29/2016   Procedure: ADJUSTABLE SUTURE RIGHT EYE LATERAL RECTUS RESECTION RIGHT EYE ;  Surgeon: Gevena Cotton, MD;  Location: [facility];  Service: Ophthalmology;  Laterality: Right;  . CHOLECYSTECTOMY OPEN  1968  . EYE SURGERY  age 62   repair right eye injury  . GLAUCOMA SURGERY Right 08-23-2015  at [facility]  . MEDIAN RECTUS REPAIR Right 03/29/2016   Procedure: MEDIAN RECTUS RESECTION RIGHT EYE ADJUSTABLE SUTURE RIGHT EYE LATERAL RECTUS RESECTION RIGHT EYE ;  Surgeon:  Gevena Cotton, MD;  Location: [facility];  Service: Ophthalmology;  Laterality: Right;     OB History   No obstetric history on file.     Family History  Problem Relation Age of Onset  . Emphysema Father   . Heart failure Father   . Heart failure Sister   . COPD Brother     Social History   Tobacco Use  . Smoking status: Current Every Day Smoker    Packs/day: 0.50    Years: 5.00    Pack years: 2.50    Types: Cigarettes  . Smokeless tobacco: Never Used  . Tobacco comment: reports 3 cigarettes per day  Substance Use Topics  . Alcohol use: Yes    Comment: "occasionally" I have a beer.  estimates 4 beers/week  . Drug use: No    Home Medications Prior to Admission medications   Medication Sig Start Date End Date Taking? Authorizing Provider  cycloSPORINE (RESTASIS) 0.05 % ophthalmic emulsion 1 drop 2 (two) times daily.    [provider]  lisinopril (ZESTRIL) 5 MG tablet Take 5 mg by mouth daily.    [provider]  metFORMIN (GLUCOPHAGE) 500 MG tablet Take 500 mg by mouth 2 (two) times daily. 09/05/19   [provider]  mirtazapine (REMERON) 30 MG tablet Take 1 tablet (30 mg total) by mouth at bedtime. 10/04/19   Kathrynn Ducking, MD  Multiple Vitamin (MULTIVITAMIN) tablet Take 1 tablet  by mouth daily.    [provider]  rosuvastatin (CRESTOR) 10 MG tablet Take 1 tablet (10 mg total) by mouth daily. 02/28/19 05/29/19  Patwardhan, Reynold Bowen, MD    Allergies    Patient has no known allergies.  Review of Systems   Review of Systems  Constitutional: Negative for fever.  Eyes: Negative for visual disturbance.  Respiratory: Negative for shortness of breath.   Cardiovascular: Negative for chest pain.  Gastrointestinal: Negative for abdominal pain, diarrhea, nausea and vomiting.  Genitourinary: Negative for pelvic pain.  Musculoskeletal: Negative for back pain.       Right leg pain  Skin: Negative for wound.    Neurological: Negative for numbness.    Physical Exam Updated Vital Signs BP (!) 153/46 (BP Location: Left Arm)   Pulse 98   Temp 98.7 F (37.1 C) (Oral)   Resp 16   SpO2 94%   Physical Exam Vitals and nursing note reviewed.  Constitutional:      General: She is not in acute distress.    Appearance: She is well-developed.  HENT:     Head: Normocephalic and atraumatic.  Eyes:     Conjunctiva/sclera: Conjunctivae normal.  Cardiovascular:     Rate and Rhythm: Normal rate.  Pulmonary:     Effort: Pulmonary effort is normal.  Musculoskeletal:     Cervical back: Neck supple.     Comments: TTP and swelling to the distal fibula and bilat malleoli. TTP noted to the hindfoot as well. DP pulses 2+ and symmetric. Brisk cap refill. Able to wiggle toes.   Skin:    General: Skin is warm and dry.  Neurological:     Mental Status: She is alert.     ED Results / Procedures / Treatments   Labs (all labs ordered are listed, but only abnormal results are displayed) Labs Reviewed - No data to display  EKG None  Radiology DG Tibia/Fibula Right  Result Date: 01/17/2020 CLINICAL DATA:  Recent fall with pain. EXAM: RIGHT TIBIA AND FIBULA - 2 VIEW COMPARISON:  Right ankle 01/17/2020 FINDINGS: Oblique and minimally displaced fracture involving the distal fibula. Concern for a minimally displaced fracture involving the medial malleolus. Ankle appears located on these two views. The knee is located. Extensive soft tissue swelling at the ankle. Enthesopathic changes involving the patella. IMPRESSION: Fractures of the distal fibula and distal tibia. Electronically Signed   By: Markus Daft M.D.   On: 01/17/2020 12:13   DG Ankle Right Port  Result Date: 01/17/2020 CLINICAL DATA:  Fall with right foot pain and swelling. EXAM: PORTABLE RIGHT ANKLE - 2 VIEW COMPARISON:  Tibia and fibula 01/17/2020 and right foot 01/17/2020 FINDINGS: Extensive soft tissue swelling in the ankle particularly along the  lateral aspect. There is a minimally displaced oblique fracture of the fibula near the level of the ankle joint. Mild cortical irregularity along the medial malleolus and cannot exclude a subtle fracture in this area. Ankle is located. IMPRESSION: Minimally displaced fracture of the distal fibula. Question a subtle fracture involving the medial malleolus. Electronically Signed   By: Markus Daft M.D.   On: 01/17/2020 12:16   DG Foot Complete Right  Result Date: 01/17/2020 CLINICAL DATA:  Fall with right foot pain. EXAM: RIGHT FOOT COMPLETE - 3+ VIEW COMPARISON:  Right ankle 01/17/2020 FINDINGS: Minimally displaced fracture of the distal fibula. No evidence for a foot fracture. Diffuse soft tissue swelling at the ankle. Ankle is located. Overall alignment of the foot is within  normal limits. IMPRESSION: Minimally displaced fracture of the distal fibula. Negative for a right foot fracture. Electronically Signed   By: Markus Daft M.D.   On: 01/17/2020 12:19    Procedures Procedures (including critical care time)  SPLINT APPLICATION Date/Time: 0000000 PM Authorized by: Rodney Booze Consent: Verbal consent obtained. Risks and benefits: risks, benefits and alternatives were discussed Consent given by: patient Splint applied by: orthopedic technician Location details: RLE Splint type: posterior short leg splint with stirrup Post-procedure: The splinted body part was neurovascularly unchanged following the procedure. Patient tolerance: Patient tolerated the procedure well with no immediate complications.   Medications Ordered in ED Medications  acetaminophen (TYLENOL) tablet 650 mg (650 mg Oral Given 01/17/20 1157)    ED Course  I have reviewed the triage vital signs and the nursing notes.  Pertinent labs & imaging results that were available during my care of the patient were reviewed by me and considered in my medical decision making (see chart for details).    MDM Rules/Calculators/A&P                       79 year old female presenting after mechanical fall that occurred yesterday when she twisted her ankle.  Denies other injuries.  X-rays personally reviewed and I agree with the radiologist X-ray right tip/fib with distal fibula and distal tibia fractures X-ray right ankle with minimally displaced fracture of the distal fibula subtle fracture involving the medial malleolus X-ray right foot without evidence of fracture of the foot but does redemonstrate distal fibular fracture  12:44 PM CONSULT with Dr. Griffin Basil who recommends a posterior short leg splint with stirrup. He recommends that the pt f/u in clinic.   Pt placed in splint. Crutches given. She was able to ambulate safely with crutches in the ED. discussed f/u with orthopedics and discussed specific return precautions. She voiced understanding and is in agreement with plan. All questions answered, pt stable for d/c.   Final Clinical Impression(s) / ED Diagnoses Final diagnoses:  Foot pain  Closed bimalleolar fracture with fracture of distal fibula    Rx / DC Orders ED Discharge Orders    None       Rodney Booze, PA-C 01/17/20 [place], Adam, DO 01/18/20 1623

## 2020-01-25 ENCOUNTER — Other Ambulatory Visit: Payer: Self-pay | Admitting: Neurology

## 2020-02-26 ENCOUNTER — Ambulatory Visit: Payer: Medicare Other | Admitting: Neurology

## 2020-04-16 ENCOUNTER — Other Ambulatory Visit: Payer: Self-pay | Admitting: Cardiology

## 2020-04-16 DIAGNOSIS — E782 Mixed hyperlipidemia: Secondary | ICD-10-CM

## 2020-05-19 ENCOUNTER — Encounter (HOSPITAL_COMMUNITY)
Admission: EM | Disposition: A | Discharge status: Home Health | Payer: Self-pay | Source: Home / Self Care | Attending: Internal Medicine

## 2020-05-19 ENCOUNTER — Inpatient Hospital Stay (HOSPITAL_COMMUNITY)
Admission: EM | Admit: 2020-05-19 | Discharge: 2020-06-05 | DRG: 853 | Disposition: A | Discharge status: Home Health | Payer: Medicare Other | Attending: Family Medicine | Admitting: Family Medicine

## 2020-05-19 ENCOUNTER — Inpatient Hospital Stay (HOSPITAL_COMMUNITY): Payer: Medicare Other

## 2020-05-19 ENCOUNTER — Emergency Department (HOSPITAL_COMMUNITY): Payer: Medicare Other | Admitting: Certified Registered Nurse Anesthetist

## 2020-05-19 ENCOUNTER — Other Ambulatory Visit: Payer: Self-pay

## 2020-05-19 DIAGNOSIS — F17210 Nicotine dependence, cigarettes, uncomplicated: Secondary | ICD-10-CM | POA: Diagnosis present

## 2020-05-19 DIAGNOSIS — A409 Streptococcal sepsis, unspecified: Principal | ICD-10-CM | POA: Diagnosis present

## 2020-05-19 DIAGNOSIS — J9621 Acute and chronic respiratory failure with hypoxia: Secondary | ICD-10-CM | POA: Diagnosis present

## 2020-05-19 DIAGNOSIS — E119 Type 2 diabetes mellitus without complications: Secondary | ICD-10-CM

## 2020-05-19 DIAGNOSIS — Z8616 Personal history of COVID-19: Secondary | ICD-10-CM | POA: Diagnosis not present

## 2020-05-19 DIAGNOSIS — R6521 Severe sepsis with septic shock: Secondary | ICD-10-CM | POA: Diagnosis present

## 2020-05-19 DIAGNOSIS — L0211 Cutaneous abscess of neck: Secondary | ICD-10-CM | POA: Diagnosis present

## 2020-05-19 DIAGNOSIS — E878 Other disorders of electrolyte and fluid balance, not elsewhere classified: Secondary | ICD-10-CM | POA: Diagnosis not present

## 2020-05-19 DIAGNOSIS — R1310 Dysphagia, unspecified: Secondary | ICD-10-CM | POA: Diagnosis not present

## 2020-05-19 DIAGNOSIS — N179 Acute kidney failure, unspecified: Secondary | ICD-10-CM | POA: Diagnosis present

## 2020-05-19 DIAGNOSIS — R739 Hyperglycemia, unspecified: Secondary | ICD-10-CM

## 2020-05-19 DIAGNOSIS — N393 Stress incontinence (female) (male): Secondary | ICD-10-CM | POA: Diagnosis present

## 2020-05-19 DIAGNOSIS — E871 Hypo-osmolality and hyponatremia: Secondary | ICD-10-CM | POA: Diagnosis not present

## 2020-05-19 DIAGNOSIS — E1110 Type 2 diabetes mellitus with ketoacidosis without coma: Secondary | ICD-10-CM | POA: Diagnosis present

## 2020-05-19 DIAGNOSIS — R0981 Nasal congestion: Secondary | ICD-10-CM | POA: Diagnosis not present

## 2020-05-19 DIAGNOSIS — L0291 Cutaneous abscess, unspecified: Secondary | ICD-10-CM | POA: Diagnosis not present

## 2020-05-19 DIAGNOSIS — K047 Periapical abscess without sinus: Secondary | ICD-10-CM

## 2020-05-19 DIAGNOSIS — H50111 Monocular exotropia, right eye: Secondary | ICD-10-CM | POA: Diagnosis present

## 2020-05-19 DIAGNOSIS — I10 Essential (primary) hypertension: Secondary | ICD-10-CM | POA: Diagnosis present

## 2020-05-19 DIAGNOSIS — K122 Cellulitis and abscess of mouth: Secondary | ICD-10-CM | POA: Diagnosis not present

## 2020-05-19 DIAGNOSIS — R609 Edema, unspecified: Secondary | ICD-10-CM

## 2020-05-19 DIAGNOSIS — Z972 Presence of dental prosthetic device (complete) (partial): Secondary | ICD-10-CM

## 2020-05-19 DIAGNOSIS — R220 Localized swelling, mass and lump, head: Secondary | ICD-10-CM

## 2020-05-19 DIAGNOSIS — G250 Essential tremor: Secondary | ICD-10-CM | POA: Diagnosis present

## 2020-05-19 DIAGNOSIS — E8770 Fluid overload, unspecified: Secondary | ICD-10-CM | POA: Diagnosis not present

## 2020-05-19 DIAGNOSIS — Z79899 Other long term (current) drug therapy: Secondary | ICD-10-CM

## 2020-05-19 DIAGNOSIS — E11649 Type 2 diabetes mellitus with hypoglycemia without coma: Secondary | ICD-10-CM | POA: Diagnosis not present

## 2020-05-19 DIAGNOSIS — E1165 Type 2 diabetes mellitus with hyperglycemia: Secondary | ICD-10-CM | POA: Diagnosis not present

## 2020-05-19 DIAGNOSIS — B370 Candidal stomatitis: Secondary | ICD-10-CM | POA: Diagnosis not present

## 2020-05-19 DIAGNOSIS — A419 Sepsis, unspecified organism: Secondary | ICD-10-CM | POA: Diagnosis not present

## 2020-05-19 DIAGNOSIS — Z973 Presence of spectacles and contact lenses: Secondary | ICD-10-CM

## 2020-05-19 DIAGNOSIS — R0602 Shortness of breath: Secondary | ICD-10-CM

## 2020-05-19 DIAGNOSIS — J9601 Acute respiratory failure with hypoxia: Secondary | ICD-10-CM | POA: Diagnosis not present

## 2020-05-19 DIAGNOSIS — E782 Mixed hyperlipidemia: Secondary | ICD-10-CM | POA: Diagnosis present

## 2020-05-19 DIAGNOSIS — D473 Essential (hemorrhagic) thrombocythemia: Secondary | ICD-10-CM | POA: Diagnosis present

## 2020-05-19 DIAGNOSIS — Z825 Family history of asthma and other chronic lower respiratory diseases: Secondary | ICD-10-CM

## 2020-05-19 DIAGNOSIS — D649 Anemia, unspecified: Secondary | ICD-10-CM | POA: Diagnosis not present

## 2020-05-19 DIAGNOSIS — J9690 Respiratory failure, unspecified, unspecified whether with hypoxia or hypercapnia: Secondary | ICD-10-CM

## 2020-05-19 DIAGNOSIS — Z794 Long term (current) use of insulin: Secondary | ICD-10-CM | POA: Diagnosis not present

## 2020-05-19 DIAGNOSIS — Z8673 Personal history of transient ischemic attack (TIA), and cerebral infarction without residual deficits: Secondary | ICD-10-CM

## 2020-05-19 DIAGNOSIS — R221 Localized swelling, mass and lump, neck: Secondary | ICD-10-CM

## 2020-05-19 DIAGNOSIS — Z8249 Family history of ischemic heart disease and other diseases of the circulatory system: Secondary | ICD-10-CM

## 2020-05-19 HISTORY — PX: INTUBATION-ENDOTRACHEAL WITH TRACHEOSTOMY STANDBY: SHX6592

## 2020-05-19 LAB — CBC WITH DIFFERENTIAL/PLATELET
Abs Immature Granulocytes: 0 10*3/uL (ref 0.00–0.07)
Basophils Absolute: 0 10*3/uL (ref 0.0–0.1)
Basophils Relative: 0 %
Eosinophils Absolute: 0 10*3/uL (ref 0.0–0.5)
Eosinophils Relative: 0 %
HCT: 31.7 % — ABNORMAL LOW (ref 36.0–46.0)
Hemoglobin: 10 g/dL — ABNORMAL LOW (ref 12.0–15.0)
Lymphocytes Relative: 17 %
Lymphs Abs: 3.4 10*3/uL (ref 0.7–4.0)
MCH: 28.2 pg (ref 26.0–34.0)
MCHC: 31.5 g/dL (ref 30.0–36.0)
MCV: 89.3 fL (ref 80.0–100.0)
Monocytes Absolute: 2.2 10*3/uL — ABNORMAL HIGH (ref 0.1–1.0)
Monocytes Relative: 11 %
Neutro Abs: 14.5 10*3/uL — ABNORMAL HIGH (ref 1.7–7.7)
Neutrophils Relative %: 72 %
Platelets: 238 10*3/uL (ref 150–400)
RBC: 3.55 MIL/uL — ABNORMAL LOW (ref 3.87–5.11)
RDW: 13.6 % (ref 11.5–15.5)
WBC: 20.1 10*3/uL — ABNORMAL HIGH (ref 4.0–10.5)
nRBC: 0 % (ref 0.0–0.2)
nRBC: 0 /100 WBC

## 2020-05-19 LAB — I-STAT CHEM 8, ED
BUN: 34 mg/dL — ABNORMAL HIGH (ref 8–23)
Calcium, Ion: 1.19 mmol/L (ref 1.15–1.40)
Chloride: 99 mmol/L (ref 98–111)
Creatinine, Ser: 1.2 mg/dL — ABNORMAL HIGH (ref 0.44–1.00)
Glucose, Bld: 553 mg/dL (ref 70–99)
HCT: 32 % — ABNORMAL LOW (ref 36.0–46.0)
Hemoglobin: 10.9 g/dL — ABNORMAL LOW (ref 12.0–15.0)
Potassium: 4 mmol/L (ref 3.5–5.1)
Sodium: 138 mmol/L (ref 135–145)
TCO2: 22 mmol/L (ref 22–32)

## 2020-05-19 LAB — LACTIC ACID, PLASMA
Lactic Acid, Venous: 1.8 mmol/L (ref 0.5–1.9)
Lactic Acid, Venous: 2.9 mmol/L (ref 0.5–1.9)

## 2020-05-19 LAB — BETA-HYDROXYBUTYRIC ACID: Beta-Hydroxybutyric Acid: 3.8 mmol/L — ABNORMAL HIGH (ref 0.05–0.27)

## 2020-05-19 LAB — BASIC METABOLIC PANEL
Anion gap: 18 — ABNORMAL HIGH (ref 5–15)
BUN: 33 mg/dL — ABNORMAL HIGH (ref 8–23)
CO2: 19 mmol/L — ABNORMAL LOW (ref 22–32)
Calcium: 9.4 mg/dL (ref 8.9–10.3)
Chloride: 98 mmol/L (ref 98–111)
Creatinine, Ser: 1.53 mg/dL — ABNORMAL HIGH (ref 0.44–1.00)
GFR calc Af Amer: 38 mL/min — ABNORMAL LOW (ref >60)
GFR calc non Af Amer: 32 mL/min — ABNORMAL LOW (ref >60)
Glucose, Bld: 568 mg/dL (ref 70–99)
Potassium: 4 mmol/L (ref 3.5–5.1)
Sodium: 135 mmol/L (ref 135–145)

## 2020-05-19 LAB — HEMOGLOBIN A1C
Hgb A1c MFr Bld: 10.3 % — ABNORMAL HIGH (ref 4.8–5.6)
Mean Plasma Glucose: 248.91 mg/dL

## 2020-05-19 LAB — HEPATIC FUNCTION PANEL
ALT: 26 U/L (ref 0–44)
AST: 23 U/L (ref 15–41)
Albumin: 3 g/dL — ABNORMAL LOW (ref 3.5–5.0)
Alkaline Phosphatase: 148 U/L — ABNORMAL HIGH (ref 38–126)
Bilirubin, Direct: 0.2 mg/dL (ref 0.0–0.2)
Indirect Bilirubin: 1.3 mg/dL — ABNORMAL HIGH (ref 0.3–0.9)
Total Bilirubin: 1.5 mg/dL — ABNORMAL HIGH (ref 0.3–1.2)
Total Protein: 6.8 g/dL (ref 6.5–8.1)

## 2020-05-19 LAB — MRSA PCR SCREENING: MRSA by PCR: NEGATIVE

## 2020-05-19 LAB — I-STAT VENOUS BLOOD GAS, ED
Acid-Base Excess: 0 mmol/L (ref 0.0–2.0)
Bicarbonate: 23.6 mmol/L (ref 20.0–28.0)
Calcium, Ion: 1.18 mmol/L (ref 1.15–1.40)
HCT: 34 % — ABNORMAL LOW (ref 36.0–46.0)
Hemoglobin: 11.6 g/dL — ABNORMAL LOW (ref 12.0–15.0)
O2 Saturation: 86 %
Potassium: 4.1 mmol/L (ref 3.5–5.1)
Sodium: 138 mmol/L (ref 135–145)
TCO2: 25 mmol/L (ref 22–32)
pCO2, Ven: 35.4 mmHg — ABNORMAL LOW (ref 44.0–60.0)
pH, Ven: 7.433 — ABNORMAL HIGH (ref 7.250–7.430)
pO2, Ven: 49 mmHg — ABNORMAL HIGH (ref 32.0–45.0)

## 2020-05-19 LAB — GLUCOSE, CAPILLARY
Glucose-Capillary: 505 mg/dL (ref 70–99)
Glucose-Capillary: 444 mg/dL — ABNORMAL HIGH (ref 70–99)
Glucose-Capillary: 347 mg/dL — ABNORMAL HIGH (ref 70–99)
Glucose-Capillary: 306 mg/dL — ABNORMAL HIGH (ref 70–99)
Glucose-Capillary: 259 mg/dL — ABNORMAL HIGH (ref 70–99)
Glucose-Capillary: 282 mg/dL — ABNORMAL HIGH (ref 70–99)

## 2020-05-19 LAB — MAGNESIUM: Magnesium: 1.8 mg/dL (ref 1.7–2.4)

## 2020-05-19 LAB — SARS CORONAVIRUS 2 BY RT PCR (HOSPITAL ORDER, PERFORMED IN ~~LOC~~ HOSPITAL LAB): SARS Coronavirus 2: NEGATIVE

## 2020-05-19 LAB — PHOSPHORUS: Phosphorus: 1.4 mg/dL — ABNORMAL LOW (ref 2.5–4.6)

## 2020-05-19 SURGERY — INTUBATION-ENDOTRACHEAL WITH TRACHEOSTOMY STANDBY
Anesthesia: General | Site: Throat

## 2020-05-19 MED ORDER — COCAINE HCL 4 % EX SOLN
CUTANEOUS | Status: AC
  Filled 2020-05-19: qty 4

## 2020-05-19 MED ORDER — FENTANYL CITRATE (PF) 100 MCG/2ML IJ SOLN
INTRAMUSCULAR | Status: DC | PRN
  Administered 2020-05-19 (×2): 50 ug via INTRAVENOUS

## 2020-05-19 MED ORDER — INSULIN ASPART 100 UNIT/ML ~~LOC~~ SOLN: 0.0000 [IU] | SUBCUTANEOUS | Status: DC

## 2020-05-19 MED ORDER — IOHEXOL 300 MG/ML  SOLN
75.0000 mL | Freq: Once | INTRAMUSCULAR | Status: AC | PRN
  Administered 2020-05-19: 75 mL via INTRAVENOUS

## 2020-05-19 MED ORDER — PROPOFOL 10 MG/ML IV BOLUS
INTRAVENOUS | Status: DC | PRN
  Administered 2020-05-19: 30 mg via INTRAVENOUS

## 2020-05-19 MED ORDER — INSULIN REGULAR(HUMAN) IN NACL 100-0.9 UT/100ML-% IV SOLN
INTRAVENOUS | Status: DC
  Administered 2020-05-19: 7.5 [IU]/h via INTRAVENOUS
  Administered 2020-05-20: 2.2 [IU]/h via INTRAVENOUS
  Filled 2020-05-19 (×3): qty 100

## 2020-05-19 MED ORDER — HEPARIN SODIUM (PORCINE) 5000 UNIT/ML IJ SOLN
5000.0000 [IU] | Freq: Three times a day (TID) | INTRAMUSCULAR | Status: DC
  Administered 2020-05-19 – 2020-05-27 (×23): 5000 [IU] via SUBCUTANEOUS
  Filled 2020-05-19 (×24): qty 1

## 2020-05-19 MED ORDER — LACTATED RINGERS IV SOLN: INTRAVENOUS | Status: DC

## 2020-05-19 MED ORDER — SODIUM CHLORIDE 0.9 % IV BOLUS: 1000.0000 mL | Freq: Once | INTRAVENOUS | Status: DC

## 2020-05-19 MED ORDER — LACTATED RINGERS IV BOLUS
1000.0000 mL | Freq: Once | INTRAVENOUS | Status: AC
  Administered 2020-05-19: 1000 mL via INTRAVENOUS

## 2020-05-19 MED ORDER — KETAMINE HCL 10 MG/ML IJ SOLN
INTRAMUSCULAR | Status: DC | PRN
Start: 2020-05-19 — End: 2020-05-19
  Administered 2020-05-19: 30 mg via INTRAVENOUS

## 2020-05-19 MED ORDER — DEXMEDETOMIDINE HCL IN NACL 400 MCG/100ML IV SOLN
0.0000 ug/kg/h | INTRAVENOUS | Status: AC
  Administered 2020-05-20: 0.7 ug/kg/h via INTRAVENOUS
  Administered 2020-05-20: 0.8 ug/kg/h via INTRAVENOUS
  Administered 2020-05-21: 1.2 ug/kg/h via INTRAVENOUS
  Administered 2020-05-21: 0.5 ug/kg/h via INTRAVENOUS
  Administered 2020-05-21: 0.8 ug/kg/h via INTRAVENOUS
  Administered 2020-05-22: 0.4 ug/kg/h via INTRAVENOUS
  Filled 2020-05-19 (×5): qty 100

## 2020-05-19 MED ORDER — POLYETHYLENE GLYCOL 3350 17 G PO PACK
17.0000 g | PACK | Freq: Every day | ORAL | Status: DC
  Administered 2020-05-22 – 2020-05-23 (×2): 17 g via ORAL
  Filled 2020-05-19 (×3): qty 1

## 2020-05-19 MED ORDER — DOCUSATE SODIUM 100 MG PO CAPS: 100.0000 mg | ORAL_CAPSULE | Freq: Two times a day (BID) | ORAL | Status: DC | PRN

## 2020-05-19 MED ORDER — INSULIN REGULAR(HUMAN) IN NACL 100-0.9 UT/100ML-% IV SOLN: Freq: Once | INTRAVENOUS | Status: DC

## 2020-05-19 MED ORDER — FENTANYL CITRATE (PF) 250 MCG/5ML IJ SOLN
INTRAMUSCULAR | Status: AC
  Filled 2020-05-19: qty 5

## 2020-05-19 MED ORDER — DEXMEDETOMIDINE HCL 200 MCG/2ML IV SOLN
INTRAVENOUS | Status: DC | PRN
  Administered 2020-05-19: 8 ug via INTRAVENOUS

## 2020-05-19 MED ORDER — ROCURONIUM BROMIDE 10 MG/ML (PF) SYRINGE
PREFILLED_SYRINGE | INTRAVENOUS | Status: DC | PRN
  Administered 2020-05-19: 30 mg via INTRAVENOUS

## 2020-05-19 MED ORDER — LACTATED RINGERS IV BOLUS: 1000.0000 mL | Freq: Once | INTRAVENOUS | Status: DC

## 2020-05-19 MED ORDER — SODIUM CHLORIDE 0.9 % IV SOLN
INTRAVENOUS | Status: DC | PRN
Start: 2020-05-19 — End: 2020-05-19

## 2020-05-19 MED ORDER — DOCUSATE SODIUM 50 MG/5ML PO LIQD
100.0000 mg | Freq: Two times a day (BID) | ORAL | Status: DC
  Administered 2020-05-21 – 2020-05-23 (×5): 100 mg via ORAL
  Filled 2020-05-19 (×6): qty 10

## 2020-05-19 MED ORDER — MIDAZOLAM HCL 5 MG/5ML IJ SOLN
INTRAMUSCULAR | Status: DC | PRN
  Administered 2020-05-19: 2 mg via INTRAVENOUS

## 2020-05-19 MED ORDER — PHENYLEPHRINE HCL (PRESSORS) 10 MG/ML IV SOLN
INTRAVENOUS | Status: DC | PRN
  Administered 2020-05-19 (×2): 80 ug via INTRAVENOUS

## 2020-05-19 MED ORDER — PROPOFOL 10 MG/ML IV BOLUS
INTRAVENOUS | Status: AC
  Filled 2020-05-19: qty 20

## 2020-05-19 MED ORDER — PANTOPRAZOLE SODIUM 40 MG IV SOLR
40.0000 mg | INTRAVENOUS | Status: DC
  Administered 2020-05-20 – 2020-05-24 (×5): 40 mg via INTRAVENOUS
  Filled 2020-05-19 (×5): qty 40

## 2020-05-19 MED ORDER — PROPOFOL 500 MG/50ML IV EMUL
INTRAVENOUS | Status: DC | PRN
  Administered 2020-05-19: 75 ug/kg/min via INTRAVENOUS

## 2020-05-19 MED ORDER — LACTATED RINGERS IV SOLN: INTRAVENOUS | Status: DC | PRN

## 2020-05-19 MED ORDER — KETAMINE HCL 50 MG/5ML IJ SOSY
PREFILLED_SYRINGE | INTRAMUSCULAR | Status: AC
  Filled 2020-05-19: qty 5

## 2020-05-19 MED ORDER — POLYETHYLENE GLYCOL 3350 17 G PO PACK: 17.0000 g | PACK | Freq: Every day | ORAL | Status: DC | PRN

## 2020-05-19 MED ORDER — FENTANYL CITRATE (PF) 100 MCG/2ML IJ SOLN
25.0000 ug | INTRAMUSCULAR | Status: AC | PRN
  Administered 2020-05-19 – 2020-05-20 (×3): 25 ug via INTRAVENOUS
  Filled 2020-05-19: qty 2

## 2020-05-19 MED ORDER — SODIUM CHLORIDE 0.9 % IV SOLN
1.5000 g | Freq: Four times a day (QID) | INTRAVENOUS | Status: DC
  Administered 2020-05-19 – 2020-05-22 (×12): 1.5 g via INTRAVENOUS
  Filled 2020-05-19: qty 4
  Filled 2020-05-19: qty 1.5
  Filled 2020-05-19 (×10): qty 4
  Filled 2020-05-19: qty 1.5
  Filled 2020-05-19 (×2): qty 4

## 2020-05-19 MED ORDER — GLYCOPYRROLATE 0.2 MG/ML IJ SOLN
INTRAMUSCULAR | Status: DC | PRN
  Administered 2020-05-19: .2 mg via INTRAVENOUS

## 2020-05-19 MED ORDER — FENTANYL CITRATE (PF) 100 MCG/2ML IJ SOLN
25.0000 ug | INTRAMUSCULAR | Status: DC | PRN
  Administered 2020-05-19 – 2020-05-20 (×2): 50 ug via INTRAVENOUS
  Administered 2020-05-20 (×2): 100 ug via INTRAVENOUS
  Administered 2020-05-20: 50 ug via INTRAVENOUS
  Administered 2020-05-20: 100 ug via INTRAVENOUS
  Administered 2020-05-20: 25 ug via INTRAVENOUS
  Administered 2020-05-21: 100 ug via INTRAVENOUS
  Administered 2020-05-21: 50 ug via INTRAVENOUS
  Administered 2020-05-21 (×5): 100 ug via INTRAVENOUS
  Administered 2020-05-21: 50 ug via INTRAVENOUS
  Administered 2020-05-21: 100 ug via INTRAVENOUS
  Administered 2020-05-21 (×2): 50 ug via INTRAVENOUS
  Administered 2020-05-21 – 2020-05-22 (×2): 100 ug via INTRAVENOUS
  Administered 2020-05-22: 50 ug via INTRAVENOUS
  Administered 2020-05-23: 100 ug via INTRAVENOUS
  Administered 2020-05-23: 25 ug via INTRAVENOUS
  Administered 2020-05-23 – 2020-05-24 (×3): 100 ug via INTRAVENOUS
  Administered 2020-05-24: 25 ug via INTRAVENOUS
  Administered 2020-05-24: 50 ug via INTRAVENOUS
  Administered 2020-05-24: 25 ug via INTRAVENOUS
  Administered 2020-05-24 (×3): 50 ug via INTRAVENOUS
  Administered 2020-05-24 (×2): 25 ug via INTRAVENOUS
  Filled 2020-05-19 (×13): qty 2

## 2020-05-19 MED ORDER — MIDAZOLAM HCL 2 MG/2ML IJ SOLN
INTRAMUSCULAR | Status: AC
  Filled 2020-05-19: qty 2

## 2020-05-19 MED ORDER — DEXMEDETOMIDINE HCL IN NACL 400 MCG/100ML IV SOLN
0.4000 ug/kg/h | INTRAVENOUS | Status: DC
  Administered 2020-05-19: 0.4 ug/kg/h via INTRAVENOUS
  Filled 2020-05-19: qty 100

## 2020-05-19 SURGICAL SUPPLY — 37 items
BLADE CLIPPER SURG (BLADE) IMPLANT
BLADE SURG 15 STRL LF DISP TIS (BLADE) IMPLANT
BLADE SURG 15 STRL SS (BLADE)
CANISTER SUCT 3000ML PPV (MISCELLANEOUS) IMPLANT
CLEANER TIP ELECTROSURG 2X2 (MISCELLANEOUS) IMPLANT
COVER SURGICAL LIGHT HANDLE (MISCELLANEOUS) IMPLANT
COVER WAND RF STERILE (DRAPES) IMPLANT
DRAPE HALF SHEET 40X57 (DRAPES) IMPLANT
ELECT COATED BLADE 2.86 ST (ELECTRODE) IMPLANT
ELECT REM PT RETURN 9FT ADLT (ELECTROSURGICAL)
ELECTRODE REM PT RTRN 9FT ADLT (ELECTROSURGICAL) IMPLANT
GAUZE 4X4 16PLY RFD (DISPOSABLE) IMPLANT
GAUZE XEROFORM 5X9 LF (GAUZE/BANDAGES/DRESSINGS) IMPLANT
GLOVE BIOGEL M 7.0 STRL (GLOVE) IMPLANT
GOWN STRL REUS W/ TWL LRG LVL3 (GOWN DISPOSABLE) IMPLANT
GOWN STRL REUS W/TWL LRG LVL3 (GOWN DISPOSABLE)
HOLDER TRACH TUBE VELCRO 19.5 (MISCELLANEOUS) IMPLANT
KIT BASIN OR (CUSTOM PROCEDURE TRAY) IMPLANT
KIT SUCTION CATH 14FR (SUCTIONS) IMPLANT
KIT TURNOVER KIT B (KITS) IMPLANT
NEEDLE HYPO 25GX1X1/2 BEV (NEEDLE) IMPLANT
NS IRRIG 1000ML POUR BTL (IV SOLUTION) IMPLANT
PACK EENT II TURBAN DRAPE (CUSTOM PROCEDURE TRAY) IMPLANT
PAD ARMBOARD 7.5X6 YLW CONV (MISCELLANEOUS) IMPLANT
PENCIL SMOKE EVACUATOR (MISCELLANEOUS) IMPLANT
SPONGE DRAIN TRACH 4X4 STRL 2S (GAUZE/BANDAGES/DRESSINGS) IMPLANT
SPONGE INTESTINAL PEANUT (DISPOSABLE) IMPLANT
SUT CHROMIC 2 0 SH (SUTURE) IMPLANT
SUT ETHILON 2 0 FS 18 (SUTURE) IMPLANT
SUT SILK 2 0 PERMA HAND 18 BK (SUTURE) IMPLANT
SUT SILK 3 0 REEL (SUTURE) IMPLANT
SYR 20ML LL LF (SYRINGE) IMPLANT
SYR BULB IRRIG 60ML STRL (SYRINGE) IMPLANT
SYR CONTROL 10ML LL (SYRINGE) IMPLANT
TUBE CONNECTING 12'X1/4 (SUCTIONS)
TUBE CONNECTING 12X1/4 (SUCTIONS) IMPLANT
WATER STERILE IRR 1000ML POUR (IV SOLUTION) IMPLANT

## 2020-05-19 NOTE — ED Triage Notes (Signed)
BIB by POV, PT had 4 teeth removed Friday. PT c/o continued throat swelling. Upon arrival pt has edema present under chin and throat area. 02 is 98% on RA, HR 101. HX DM type 2, denies allergies to any abx.

## 2020-05-19 NOTE — Progress Notes (Signed)
Precedex put on hold for hypotension, awaiting orders from Melissa

## 2020-05-19 NOTE — Transfer of Care (Signed)
Immediate Anesthesia Transfer of Care Note  Patient: Katherine [name]  Procedure(s) Performed: INTUBATION-ENDOTRACHEAL WITH TRACHEOSTOMY STANDBY (N/A Throat)  Patient Location: PACU  Anesthesia Type:General  Level of Consciousness: Patient remains intubated per anesthesia plan  Airway & Oxygen Therapy: Patient remains intubated per anesthesia plan  Post-op Assessment: Report given to RN and Post -op Vital signs reviewed and stable  Post vital signs: Reviewed and stable  Last Vitals:  Vitals Value Taken Time  BP    Temp    Pulse    Resp    SpO2      Last Pain:  Vitals:   05/19/20 1733  PainSc: 10-Worst pain ever         Complications: No complications documented.

## 2020-05-19 NOTE — ED Provider Notes (Signed)
[facility] EMERGENCY DEPARTMENT Provider Note   CSN: [id number] Arrival date & time: 05/19/20  1601     History Chief Complaint  Patient presents with  . Oral Swelling    Katherine [name] is a 79 y.o. female.  The history is provided by the patient.  Illness Location:  Face/mouth/neck Quality:  Swelling Severity:  Severe Onset quality:  Gradual Timing:  Constant Progression:  Worsening Chronicity:  New Context:  Patient with tooth extraction several days ago with worsening swelling of lower mouth/neck area Relieved by:  Nothing Worsened by:  Nothing Associated symptoms: no abdominal pain, no chest pain, no congestion, no cough, no diarrhea, no ear pain, no fatigue, no fever, no rash, no shortness of breath, no sore throat and no vomiting        Past Medical History:  Diagnosis Date  . Essential tremor 09/25/2019  . Exotropia of right eye    AND HYPERTROPIA  . Glaucoma   . Hyperlipidemia   . Hypertension   . Incomplete right bundle branch block   . LAFB (left anterior fascicular block)   . SUI (stress urinary incontinence, female)   . Type 2 diabetes mellitus ([place])   . Wears dentures    upper denture and lower partial denture  . Wears glasses     Patient Active Problem List   Diagnosis Date Noted  . 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 Abscess 05/19/2020  . Essential tremor 09/25/2019  . Dizziness 02/28/2019  . Mixed hyperlipidemia 02/28/2019  . Lacunar stroke ([place]) 02/28/2019  . Lightheadedness 02/28/2019    Past Surgical History:  Procedure Laterality Date  . ADJUSTABLE SUTURE MANIPULATION Right 03/29/2016   Procedure: ADJUSTABLE SUTURE RIGHT EYE LATERAL RECTUS RESECTION RIGHT EYE ;  Surgeon: Gevena Cotton, MD;  Location: [facility];  Service: Ophthalmology;  Laterality: Right;  . CHOLECYSTECTOMY OPEN  1968  . EYE SURGERY  age 24   repair right eye injury  . GLAUCOMA SURGERY Right 08-23-2015  at [facility]  . MEDIAN RECTUS  REPAIR Right 03/29/2016   Procedure: MEDIAN RECTUS RESECTION RIGHT EYE ADJUSTABLE SUTURE RIGHT EYE LATERAL RECTUS RESECTION RIGHT EYE ;  Surgeon: Gevena Cotton, MD;  Location: [facility];  Service: Ophthalmology;  Laterality: Right;     OB History   No obstetric history on file.     Family History  Problem Relation Age of Onset  . Emphysema Father   . Heart failure Father   . Heart failure Sister   . COPD Brother     Social History   Tobacco Use  . Smoking status: Current Every Day Smoker    Packs/day: 0.50    Years: 5.00    Pack years: 2.50    Types: Cigarettes  . Smokeless tobacco: Never Used  . Tobacco comment: reports 3 cigarettes per day  Vaping Use  . Vaping Use: Never used  Substance Use Topics  . Alcohol use: Yes    Comment: "occasionally" I have a beer.  estimates 4 beers/week  . Drug use: No    Home Medications Prior to Admission medications   Medication Sig Start Date End Date Taking? Authorizing Provider  acetaminophen (TYLENOL) 500 MG tablet Take 500-1,000 mg by mouth every 6 (six) hours as needed (for pain).   Yes [provider]  ibuprofen (ADVIL) 200 MG tablet Take 400 mg by mouth every 6 (six) hours as needed (for pain).   Yes [provider]  mirtazapine (REMERON) 30 MG tablet TAKE  1 TABLET(30 MG) BY MOUTH AT BEDTIME Patient taking differently: Take 30 mg by mouth at bedtime as needed (for sleep).  01/27/20  Yes Kathrynn Ducking, MD  Multiple Vitamin (MULTIVITAMIN) tablet Take 1 tablet by mouth 3 (three) times a week.    Yes [provider]  rosuvastatin (CRESTOR) 10 MG tablet TAKE 1 TABLET(10 MG) BY MOUTH DAILY Patient taking differently: Take 10 mg by mouth every evening.  04/16/20  Yes Patwardhan, Manish J, MD  timolol (TIMOPTIC) 0.5 % ophthalmic solution Place 1 drop into both eyes 2 (two) times daily.  05/18/20  Yes [provider]  citalopram (CELEXA) 10 MG tablet  01/23/20   [provider]   lisinopril (ZESTRIL) 5 MG tablet Take 5 mg by mouth daily. Patient not taking: Reported on 05/19/2020    [provider]  metFORMIN (GLUCOPHAGE) 500 MG tablet Take 500 mg by mouth 2 (two) times daily. Patient not taking: Reported on 05/19/2020 09/05/19   [provider]    Allergies    Patient has no known allergies.  Review of Systems   Review of Systems  Constitutional: Negative for chills, fatigue and fever.  HENT: Positive for dental problem, drooling, facial swelling, trouble swallowing and voice change. Negative for congestion, ear pain and sore throat.   Eyes: Negative for pain and visual disturbance.  Respiratory: Negative for cough and shortness of breath.   Cardiovascular: Negative for chest pain and palpitations.  Gastrointestinal: Negative for abdominal pain, diarrhea and vomiting.  Genitourinary: Negative for dysuria and hematuria.  Musculoskeletal: Negative for arthralgias and back pain.  Skin: Negative for color change and rash.  Neurological: Negative for seizures and syncope.  All other systems reviewed and are negative.   Physical Exam Updated Vital Signs Ht 5\' 6"  (1.676 m)   Wt 52.2 kg   BMI 18.56 kg/m   Physical Exam Vitals and nursing note reviewed.  Constitutional:      General: She is not in acute distress.    Appearance: She is well-developed. She is not ill-appearing.  HENT:     Head: Normocephalic and atraumatic.     Right Ear: Tympanic membrane normal.     Left Ear: Tympanic membrane normal.     Nose: Nose normal.     Mouth/Throat:     Comments: Severe swelling of the submental area, patient with 1 finger trismus, handling secretions, hoarse voice, floor of the mouth slightly raised  Eyes:     Extraocular Movements: Extraocular movements intact.     Conjunctiva/sclera: Conjunctivae normal.     Pupils: Pupils are equal, round, and reactive to light.  Neck:     Comments: Swelling to the anterior neck mostly in the submental  area, erythematous changes Cardiovascular:     Rate and Rhythm: Normal rate and regular rhythm.     Pulses: Normal pulses.     Heart sounds: Normal heart sounds. No murmur heard.   Pulmonary:     Effort: Pulmonary effort is normal. No respiratory distress.     Breath sounds: Normal breath sounds.  Abdominal:     Palpations: Abdomen is soft.     Tenderness: There is no abdominal tenderness.  Skin:    General: Skin is warm and dry.  Neurological:     Mental Status: She is alert.     ED Results / Procedures / Treatments   Labs (all labs ordered are listed, but only abnormal results are displayed) Labs Reviewed  CBC WITH DIFFERENTIAL/PLATELET - Abnormal; Notable  for the following components:      Result Value   WBC 20.1 (*)    RBC 3.55 (*)    Hemoglobin 10.0 (*)    HCT 31.7 (*)    All other components within normal limits  BASIC METABOLIC PANEL - Abnormal; Notable for the following components:   CO2 19 (*)    Glucose, Bld 568 (*)    BUN 33 (*)    Creatinine, Ser 1.53 (*)    GFR calc non Af Amer 32 (*)    GFR calc Af Amer 38 (*)    Anion gap 18 (*)    All other components within normal limits  HEPATIC FUNCTION PANEL - Abnormal; Notable for the following components:   Albumin 3.0 (*)    Alkaline Phosphatase 148 (*)    Total Bilirubin 1.5 (*)    Indirect Bilirubin 1.3 (*)    All other components within normal limits  BETA-HYDROXYBUTYRIC ACID - Abnormal; Notable for the following components:   Beta-Hydroxybutyric Acid 3.80 (*)    All other components within normal limits  I-STAT CHEM 8, ED - Abnormal; Notable for the following components:   BUN 34 (*)    Creatinine, Ser 1.20 (*)    Glucose, Bld 553 (*)    Hemoglobin 10.9 (*)    HCT 32.0 (*)    All other components within normal limits  I-STAT VENOUS BLOOD GAS, ED - Abnormal; Notable for the following components:   pH, Ven 7.433 (*)    pCO2, Ven 35.4 (*)    pO2, Ven 49.0 (*)    HCT 34.0 (*)    Hemoglobin 11.6 (*)      All other components within normal limits  SARS CORONAVIRUS 2 BY RT PCR (HOSPITAL ORDER, [place] [facility])  CULTURE, BLOOD (ROUTINE X 2)  CULTURE, BLOOD (ROUTINE X 2)  LACTIC ACID, PLASMA  LACTIC ACID, PLASMA  MAGNESIUM  PHOSPHORUS  CBC  BASIC METABOLIC PANEL  HEMOGLOBIN A1C    EKG None  Radiology No results found.  Procedures .Critical Care Performed by: Lennice Sites, DO Authorized by: Lennice Sites, DO   Critical care provider statement:    Critical care time (minutes):  35   Critical care was necessary to treat or prevent imminent or life-threatening deterioration of the following conditions:  Sepsis and respiratory failure   Critical care was time spent personally by me on the following activities:  Blood draw for specimens, development of treatment plan with patient or surrogate, discussions with consultants, discussions with primary provider, evaluation of patient's response to treatment, examination of patient, obtaining history from patient or surrogate, ordering and performing treatments and interventions, ordering and review of laboratory studies, ordering and review of radiographic studies, pulse oximetry, re-evaluation of patient's condition and review of old charts   I assumed direction of critical care for this patient from another provider in my specialty: no     (including critical care time)  Medications Ordered in ED Medications  ampicillin-sulbactam (UNASYN) 1.5 g in sodium chloride 0.9 % 100 mL IVPB ( Intravenous Automatically Held 05/27/20 2300)  sodium chloride 0.9 % bolus 1,000 mL ( Intravenous MAR Hold 05/19/20 1803)  docusate sodium (COLACE) capsule 100 mg ( Oral MAR Hold 05/19/20 1803)  polyethylene glycol (MIRALAX / GLYCOLAX) packet 17 g ( Oral MAR Hold 05/19/20 1803)  heparin injection 5,000 Units ( Subcutaneous Automatically Held 05/27/20 2200)  docusate (COLACE) 50 MG/5ML liquid 100 mg ( Oral Automatically Held 05/27/20 2200)  polyethylene glycol (MIRALAX / GLYCOLAX) packet 17 g ( Oral Automatically Held 05/27/20 1000)  dexmedetomidine (PRECEDEX) 400 MCG/100ML (4 mcg/mL) infusion (has no administration in time range)  fentaNYL (SUBLIMAZE) injection 25 mcg ( Intravenous MAR Hold 05/19/20 1803)  fentaNYL (SUBLIMAZE) injection 25-100 mcg ( Intravenous MAR Hold 05/19/20 1803)  insulin aspart (novoLOG) injection 0-15 Units ( Subcutaneous Automatically Held 05/27/20 2000)  pantoprazole (PROTONIX) injection 40 mg ( Intravenous Automatically Held 05/27/20 1745)  lactated ringers bolus 1,000 mL ( Intravenous MAR Hold 05/19/20 1803)    Followed by  lactated ringers infusion ( Intravenous MAR Hold 05/19/20 1803)    ED Course  I have reviewed the triage vital signs and the nursing notes.  Pertinent labs & imaging results that were available during my care of the patient were reviewed by me and considered in my medical decision making (see chart for details).    MDM Rules/Calculators/A&P                          Brent Noto is a 79 year old female with history of diabetes who presents to the ED with facial swelling.  Patient upon arrival with normal vitals.  Normal room air oxygenation.  Concern for deep space neck infection/mouth infection.  Concern for ludwigs angina.  Patient with dental extraction on Friday about 5 days ago.  Has had progressive swelling to the gum area, submental/submandibular area.  Patient has 1 finger trismus.  Hoarse voice, appears to be handling secretions okay.  Concern for airway compromise and need for emergent intubation.  Therefore, called both anesthesia, Dr. Ola Spurr, and ENT, Dr. Wilburn Cornelia and was able to arrange for patient to go to the OR for controlled intubation.  Talked with intensivist for admission afterwards.  Patient likely septic from this infectious process.  Sepsis work-up was initiated.  Patient given IV Unasyn.  Patient with elevated creatinine of 1.53.  White count of 20.  Blood sugar  of 568 however pH unremarkable.  Bicarb 19.  No obvious DKA.  Patient went directly to the OR for intubation, ICU aware.  This chart was dictated using voice recognition software.  Despite best efforts to proofread,  errors can occur which can change the documentation meaning.   Final Clinical Impression(s) / ED Diagnoses Final diagnoses:  Dental infection  Submandibular swelling  Sepsis, due to unspecified organism, unspecified whether acute organ dysfunction present [facility])  Hyperglycemia    Rx / DC Orders ED Discharge Orders    None       Lennice Sites, DO 05/19/20 1808

## 2020-05-19 NOTE — Progress Notes (Signed)
Transported patient to CT and back to 3M01 without event.

## 2020-05-19 NOTE — H&P (View-Only) (Signed)
ENT CONSULT:  Reason for Consult: Floor mouth infection with airway instability Referring Physician: ER physician  Katherine [name] is an 79 y.o. female.  HPI: Patient admitted to [facility] emergency department for acute pain Anterior floor of mouth swelling and possible airway instability.  The patient underwent dental extraction within the last week and developed increasing pain and swelling in the anterior floor of mouth.  She was referred to the emergency department by her dentist for evaluation.  ENT service consulted for emergency airway evaluation.  Past Medical History:  Diagnosis Date  . Essential tremor 09/25/2019  . Exotropia of right eye    AND HYPERTROPIA  . Glaucoma   . Hyperlipidemia   . Hypertension   . Incomplete right bundle branch block   . LAFB (left anterior fascicular block)   . SUI (stress urinary incontinence, female)   . Type 2 diabetes mellitus ([place])   . Wears dentures    upper denture and lower partial denture  . Wears glasses     Past Surgical History:  Procedure Laterality Date  . ADJUSTABLE SUTURE MANIPULATION Right 03/29/2016   Procedure: ADJUSTABLE SUTURE RIGHT EYE LATERAL RECTUS RESECTION RIGHT EYE ;  Surgeon: Gevena Cotton, MD;  Location: [facility];  Service: Ophthalmology;  Laterality: Right;  . CHOLECYSTECTOMY OPEN  1968  . EYE SURGERY  age 31   repair right eye injury  . GLAUCOMA SURGERY Right 08-23-2015  at [facility]  . MEDIAN RECTUS REPAIR Right 03/29/2016   Procedure: MEDIAN RECTUS RESECTION RIGHT EYE ADJUSTABLE SUTURE RIGHT EYE LATERAL RECTUS RESECTION RIGHT EYE ;  Surgeon: Gevena Cotton, MD;  Location: [facility];  Service: Ophthalmology;  Laterality: Right;    Family History  Problem Relation Age of Onset  . Emphysema Father   . Heart failure Father   . Heart failure Sister   . COPD Brother     Social History:  reports that she has been smoking cigarettes. She has a 2.50 pack-year smoking  history. She has never used smokeless tobacco. She reports current alcohol use. She reports that she does not use drugs.  Allergies: No Known Allergies  Medications: I have reviewed the patient's current medications.  Results for orders placed or performed during the hospital encounter of 05/19/20 (from the past 48 hour(s))  CBC with Differential     Status: Abnormal (Preliminary result)   Collection Time: 05/19/20  4:29 PM  Result Value Ref Range   WBC 20.1 (H) 4.0 - 10.5 K/uL   RBC 3.55 (L) 3.87 - 5.11 MIL/uL   Hemoglobin 10.0 (L) 12.0 - 15.0 g/dL   HCT 31.7 (L) 36 - 46 %   MCV 89.3 80.0 - 100.0 fL   MCH 28.2 26.0 - 34.0 pg   MCHC 31.5 30.0 - 36.0 g/dL   RDW 13.6 11.5 - 15.5 %   Platelets 238 150 - 400 K/uL   nRBC 0.0 0.0 - 0.2 %    Comment: Performed at [facility], [place] [address] [place], [place] [address]   Neutrophils Relative % PENDING %   Neutro Abs PENDING 1.7 - 7.7 K/uL   Band Neutrophils PENDING %   Lymphocytes Relative PENDING %   Lymphs Abs PENDING 0.7 - 4.0 K/uL   Monocytes Relative PENDING %   Monocytes Absolute PENDING 0 - 1 K/uL   Eosinophils Relative PENDING %   Eosinophils Absolute PENDING 0 - 0 K/uL   Basophils Relative PENDING %   Basophils Absolute PENDING  0 - 0 K/uL   WBC Morphology PENDING    RBC Morphology PENDING    Smear Review PENDING    Other PENDING %   nRBC PENDING 0 /100 WBC   Metamyelocytes Relative PENDING %   Myelocytes PENDING %   Promyelocytes Relative PENDING %   Blasts PENDING %   Immature Granulocytes PENDING %   Abs Immature Granulocytes PENDING 0.00 - 0.07 K/uL  Basic metabolic panel     Status: Abnormal   Collection Time: 05/19/20  4:29 PM  Result Value Ref Range   Sodium 135 135 - 145 mmol/L   Potassium 4.0 3.5 - 5.1 mmol/L   Chloride 98 98 - 111 mmol/L   CO2 19 (L) 22 - 32 mmol/L   Glucose, Bld 568 (HH) 70 - 99 mg/dL    Comment: Glucose reference range applies only to samples taken after fasting for at least 8  hours. CRITICAL RESULT CALLED TO, READ BACK BY AND VERIFIED WITH: J.LYON RN [id number] 05/19/20 MCCORMICK K    BUN 33 (H) 8 - 23 mg/dL   Creatinine, Ser 1.53 (H) 0.44 - 1.00 mg/dL   Calcium 9.4 8.9 - 10.3 mg/dL   GFR calc non Af Amer 32 (L) >60 mL/min   GFR calc Af Amer 38 (L) >60 mL/min   Anion gap 18 (H) 5 - 15    Comment: Performed at [facility] [address], [place], [place] [address]  Hepatic function panel     Status: Abnormal   Collection Time: 05/19/20  4:29 PM  Result Value Ref Range   Total Protein 6.8 6.5 - 8.1 g/dL   Albumin 3.0 (L) 3.5 - 5.0 g/dL   AST 23 15 - 41 U/L   ALT 26 0 - 44 U/L   Alkaline Phosphatase 148 (H) 38 - 126 U/L   Total Bilirubin 1.5 (H) 0.3 - 1.2 mg/dL   Bilirubin, Direct 0.2 0.0 - 0.2 mg/dL   Indirect Bilirubin 1.3 (H) 0.3 - 0.9 mg/dL    Comment: Performed at [facility] [address] [place], [place] [address]  Lactic acid, plasma     Status: None   Collection Time: 05/19/20  4:30 PM  Result Value Ref Range   Lactic Acid, Venous 1.8 0.5 - 1.9 mmol/L    Comment: Performed at [facility] [address] [place], [place] [address]  I-stat chem 8, ED (not at [facility] or [facility])     Status: Abnormal   Collection Time: 05/19/20  4:40 PM  Result Value Ref Range   Sodium 138 135 - 145 mmol/L   Potassium 4.0 3.5 - 5.1 mmol/L   Chloride 99 98 - 111 mmol/L   BUN 34 (H) 8 - 23 mg/dL   Creatinine, Ser 1.20 (H) 0.44 - 1.00 mg/dL   Glucose, Bld 553 (HH) 70 - 99 mg/dL    Comment: Glucose reference range applies only to samples taken after fasting for at least 8 hours.   Calcium, Ion 1.19 1.15 - 1.40 mmol/L   TCO2 22 22 - 32 mmol/L   Hemoglobin 10.9 (L) 12.0 - 15.0 g/dL   HCT 32.0 (L) 36 - 46 %  Beta-hydroxybutyric acid     Status: Abnormal   Collection Time: 05/19/20  4:58 PM  Result Value Ref Range   Beta-Hydroxybutyric Acid 3.80 (H) 0.05 - 0.27 mmol/L    Comment: Performed at [facility] [address], [place],   [address]  I-Stat  venous blood gas, [facility] ED)     Status: Abnormal   Collection Time: 05/19/20  5:06 PM  Result Value Ref Range   pH, Ven 7.433 (H) 7.25 - 7.43   pCO2, Ven 35.4 (L) 44 - 60 mmHg   pO2, Ven 49.0 (H) 32 - 45 mmHg   Bicarbonate 23.6 20.0 - 28.0 mmol/L   TCO2 25 22 - 32 mmol/L   O2 Saturation 86.0 %   Acid-Base Excess 0.0 0.0 - 2.0 mmol/L   Sodium 138 135 - 145 mmol/L   Potassium 4.1 3.5 - 5.1 mmol/L   Calcium, Ion 1.18 1.15 - 1.40 mmol/L   HCT 34.0 (L) 36 - 46 %   Hemoglobin 11.6 (L) 12.0 - 15.0 g/dL   Sample type VENOUS     No results found.  ROS:ROS 12 systems reviewed and negative except as stated in HPI   Height 5\' 6"  (1.676 m), weight 52.2 kg.  PHYSICAL EXAM: Patient evaluated in the operating room for emergency airway management.  She had significant swelling of the anterior floor of mouth and submental region with trismus.  Under direct visualization with a glide laryngoscope the patient was intubated with a 6.5 endotracheal tube without difficulty or complication.  Airway secure, no respiratory difficulty, patient ventilating well.  Studies Reviewed:None  Assessment/Plan: Patient admitted to [facility] operating room for emergency airway management.  Patient's airway secured by anesthesia team under glide laryngoscope.  Patient will be admitted to critical care medicine for acute management of her infection.  Plan continued antibiotics and airway management.  Patient for schedule CT scan to assess floor of mouth for possible abscess.  She may be a candidate for incision and drainage if appropriate.  Recommend evaluation by dental/oral surgery for additional management of her chronic dental infection.  We will follow.  Jerrell Belfast 05/19/2020, 5:44 PM

## 2020-05-19 NOTE — Anesthesia Procedure Notes (Signed)
Procedure Name: Intubation Date/Time: 05/19/2020 5:27 PM Performed by: Babs Bertin, CRNA Pre-anesthesia Checklist: Patient identified, Emergency Drugs available, Suction available, Patient being monitored and Timeout performed Patient Re-evaluated:Patient Re-evaluated prior to induction Oxygen Delivery Method: Circle system utilized Preoxygenation: Pre-oxygenation with 100% oxygen Laryngoscope Size: Glidescope and 3 Grade View: Grade I Tube type: Oral Tube size: 6.5 mm Number of attempts: 1 Airway Equipment and Method: Stylet and Video-laryngoscopy (AWAKE GLIDESCOPE INTUBATION) Placement Confirmation: ETT inserted through vocal cords under direct vision,  breath sounds checked- equal and bilateral and CO2 detector Secured at: 21 cm Tube secured with: Tape Dental Injury: Teeth and Oropharynx as per pre-operative assessment

## 2020-05-19 NOTE — Progress Notes (Signed)
Patient glasses, wedding ring, and one other ring taken off and put in belongings bag with patient label. Patient's husband also brought up a bag of the patient's belongings which are also at the bedside.

## 2020-05-19 NOTE — Progress Notes (Signed)
Called pharmacy to confirm Unasyn is compatible with Lactated Ringers. Karren from pharmacy says it is compatible as long as it is run over less than 4hrs.

## 2020-05-19 NOTE — Anesthesia Preprocedure Evaluation (Addendum)
Anesthesia Evaluation  Patient identified by MRN, date of birth, ID band Patient awake    Reviewed: Allergy & Precautions, H&P , NPO status , Patient's Chart, lab work & pertinent test results  Airway Mallampati: IV  TM Distance: >3 FB Neck ROM: Limited  Mouth opening: Limited Mouth Opening  Dental no notable dental hx. (+) Teeth Intact, Dental Advisory Given   Pulmonary Current Smoker,    Pulmonary exam normal breath sounds clear to auscultation       Cardiovascular hypertension, Pt. on medications + dysrhythmias  Rhythm:Regular Rate:Normal     Neuro/Psych negative neurological ROS  negative psych ROS   GI/Hepatic negative GI ROS, Neg liver ROS,   Endo/Other  diabetes, Poorly Controlled, Type 2, Oral Hypoglycemic Agents  Renal/GU negative Renal ROS  negative genitourinary   Musculoskeletal   Abdominal   Peds  Hematology negative hematology ROS (+)   Anesthesia Other Findings   Reproductive/Obstetrics negative OB ROS                            Anesthesia Physical Anesthesia Plan  ASA: III and emergent  Anesthesia Plan: General   Post-op Pain Management:    Induction: Intravenous  PONV Risk Score and Plan: 2 and Propofol infusion and Midazolam  Airway Management Planned: Oral ETT and Awake Intubation Planned  Additional Equipment:   Intra-op Plan:   Post-operative Plan: Post-operative intubation/ventilation  Informed Consent: I have reviewed the patients History and Physical, chart, labs and discussed the procedure including the risks, benefits and alternatives for the proposed anesthesia with the patient or authorized representative who has indicated his/her understanding and acceptance.     Dental advisory given  Plan Discussed with: CRNA  Anesthesia Plan Comments:        Anesthesia Quick Evaluation

## 2020-05-19 NOTE — Progress Notes (Signed)
Notified e-link of patient's hypotension with MAP in mid-50's, also asked if the patient requires a NGT tube and foley.

## 2020-05-19 NOTE — Progress Notes (Signed)
Per Dr. Lucile Shutters hold PO meds at this time.

## 2020-05-19 NOTE — Progress Notes (Signed)
eLink Physician-Brief Progress Note Patient Name: Katherine [name] DOB: 04-23-1941 MRN: [id number]   Date of Service  05/19/2020  HPI/Events of Note  Ludwig's angina, sepsis, intubated for airway protection, DKA, AKI, sepsis with high risk for septic shock.  eICU Interventions  New patient evaluation completed, volume resuscitation, insulin infusion, Unasyn for antibiotic coverage, culture results  pending, Foley catheter for accurate UOP monitoring, no NG or OG tube overnight due to high risk associated with insertion.        Kerry Kass [name] 05/19/2020, 9:12 PM

## 2020-05-19 NOTE — Consult Note (Signed)
ENT CONSULT:  Reason for Consult: Floor mouth infection with airway instability Referring Physician: ER physician  Katherine [name] is an 79 y.o. female.  HPI: Patient admitted to [facility] emergency department for acute pain Anterior floor of mouth swelling and possible airway instability.  The patient underwent dental extraction within the last week and developed increasing pain and swelling in the anterior floor of mouth.  She was referred to the emergency department by her dentist for evaluation.  ENT service consulted for emergency airway evaluation.  Past Medical History:  Diagnosis Date  . Essential tremor 09/25/2019  . Exotropia of right eye    AND HYPERTROPIA  . Glaucoma   . Hyperlipidemia   . Hypertension   . Incomplete right bundle branch block   . LAFB (left anterior fascicular block)   . SUI (stress urinary incontinence, female)   . Type 2 diabetes mellitus ([place])   . Wears dentures    upper denture and lower partial denture  . Wears glasses     Past Surgical History:  Procedure Laterality Date  . ADJUSTABLE SUTURE MANIPULATION Right 03/29/2016   Procedure: ADJUSTABLE SUTURE RIGHT EYE LATERAL RECTUS RESECTION RIGHT EYE ;  Surgeon: Gevena Cotton, MD;  Location: [facility];  Service: Ophthalmology;  Laterality: Right;  . CHOLECYSTECTOMY OPEN  1968  . EYE SURGERY  age 43   repair right eye injury  . GLAUCOMA SURGERY Right 08-23-2015  at [facility]  . MEDIAN RECTUS REPAIR Right 03/29/2016   Procedure: MEDIAN RECTUS RESECTION RIGHT EYE ADJUSTABLE SUTURE RIGHT EYE LATERAL RECTUS RESECTION RIGHT EYE ;  Surgeon: Gevena Cotton, MD;  Location: [facility];  Service: Ophthalmology;  Laterality: Right;    Family History  Problem Relation Age of Onset  . Emphysema Father   . Heart failure Father   . Heart failure Sister   . COPD Brother     Social History:  reports that she has been smoking cigarettes. She has a 2.50 pack-year smoking  history. She has never used smokeless tobacco. She reports current alcohol use. She reports that she does not use drugs.  Allergies: No Known Allergies  Medications: I have reviewed the patient's current medications.  Results for orders placed or performed during the hospital encounter of 05/19/20 (from the past 48 hour(s))  CBC with Differential     Status: Abnormal (Preliminary result)   Collection Time: 05/19/20  4:29 PM  Result Value Ref Range   WBC 20.1 (H) 4.0 - 10.5 K/uL   RBC 3.55 (L) 3.87 - 5.11 MIL/uL   Hemoglobin 10.0 (L) 12.0 - 15.0 g/dL   HCT 31.7 (L) 36 - 46 %   MCV 89.3 80.0 - 100.0 fL   MCH 28.2 26.0 - 34.0 pg   MCHC 31.5 30.0 - 36.0 g/dL   RDW 13.6 11.5 - 15.5 %   Platelets 238 150 - 400 K/uL   nRBC 0.0 0.0 - 0.2 %    Comment: Performed at [facility], [place] [address], [place], [place] [address]   Neutrophils Relative % PENDING %   Neutro Abs PENDING 1.7 - 7.7 K/uL   Band Neutrophils PENDING %   Lymphocytes Relative PENDING %   Lymphs Abs PENDING 0.7 - 4.0 K/uL   Monocytes Relative PENDING %   Monocytes Absolute PENDING 0 - 1 K/uL   Eosinophils Relative PENDING %   Eosinophils Absolute PENDING 0 - 0 K/uL   Basophils Relative PENDING %   Basophils Absolute PENDING  0 - 0 K/uL   WBC Morphology PENDING    RBC Morphology PENDING    Smear Review PENDING    Other PENDING %   nRBC PENDING 0 /100 WBC   Metamyelocytes Relative PENDING %   Myelocytes PENDING %   Promyelocytes Relative PENDING %   Blasts PENDING %   Immature Granulocytes PENDING %   Abs Immature Granulocytes PENDING 0.00 - 0.07 K/uL  Basic metabolic panel     Status: Abnormal   Collection Time: 05/19/20  4:29 PM  Result Value Ref Range   Sodium 135 135 - 145 mmol/L   Potassium 4.0 3.5 - 5.1 mmol/L   Chloride 98 98 - 111 mmol/L   CO2 19 (L) 22 - 32 mmol/L   Glucose, Bld 568 (HH) 70 - 99 mg/dL    Comment: Glucose reference range applies only to samples taken after fasting for at least 8  hours. CRITICAL RESULT CALLED TO, READ BACK BY AND VERIFIED WITH: J.LYON RN [id number] 05/19/20 MCCORMICK K    BUN 33 (H) 8 - 23 mg/dL   Creatinine, Ser 1.53 (H) 0.44 - 1.00 mg/dL   Calcium 9.4 8.9 - 10.3 mg/dL   GFR calc non Af Amer 32 (L) >60 mL/min   GFR calc Af Amer 38 (L) >60 mL/min   Anion gap 18 (H) 5 - 15    Comment: Performed at [facility] [address] [place], [place] [address]  Hepatic function panel     Status: Abnormal   Collection Time: 05/19/20  4:29 PM  Result Value Ref Range   Total Protein 6.8 6.5 - 8.1 g/dL   Albumin 3.0 (L) 3.5 - 5.0 g/dL   AST 23 15 - 41 U/L   ALT 26 0 - 44 U/L   Alkaline Phosphatase 148 (H) 38 - 126 U/L   Total Bilirubin 1.5 (H) 0.3 - 1.2 mg/dL   Bilirubin, Direct 0.2 0.0 - 0.2 mg/dL   Indirect Bilirubin 1.3 (H) 0.3 - 0.9 mg/dL    Comment: Performed at [facility] [address] [place], [place] [address]  Lactic acid, plasma     Status: None   Collection Time: 05/19/20  4:30 PM  Result Value Ref Range   Lactic Acid, Venous 1.8 0.5 - 1.9 mmol/L    Comment: Performed at [facility] [address], [place], [place] [address]  I-stat chem 8, ED (not at [facility] or [facility])     Status: Abnormal   Collection Time: 05/19/20  4:40 PM  Result Value Ref Range   Sodium 138 135 - 145 mmol/L   Potassium 4.0 3.5 - 5.1 mmol/L   Chloride 99 98 - 111 mmol/L   BUN 34 (H) 8 - 23 mg/dL   Creatinine, Ser 1.20 (H) 0.44 - 1.00 mg/dL   Glucose, Bld 553 (HH) 70 - 99 mg/dL    Comment: Glucose reference range applies only to samples taken after fasting for at least 8 hours.   Calcium, Ion 1.19 1.15 - 1.40 mmol/L   TCO2 22 22 - 32 mmol/L   Hemoglobin 10.9 (L) 12.0 - 15.0 g/dL   HCT 32.0 (L) 36 - 46 %  Beta-hydroxybutyric acid     Status: Abnormal   Collection Time: 05/19/20  4:58 PM  Result Value Ref Range   Beta-Hydroxybutyric Acid 3.80 (H) 0.05 - 0.27 mmol/L    Comment: Performed at [facility] [address] [place], [place]  [address]  I-Stat  venous blood gas, [facility] ED)     Status: Abnormal   Collection Time: 05/19/20  5:06 PM  Result Value Ref Range   pH, Ven 7.433 (H) 7.25 - 7.43   pCO2, Ven 35.4 (L) 44 - 60 mmHg   pO2, Ven 49.0 (H) 32 - 45 mmHg   Bicarbonate 23.6 20.0 - 28.0 mmol/L   TCO2 25 22 - 32 mmol/L   O2 Saturation 86.0 %   Acid-Base Excess 0.0 0.0 - 2.0 mmol/L   Sodium 138 135 - 145 mmol/L   Potassium 4.1 3.5 - 5.1 mmol/L   Calcium, Ion 1.18 1.15 - 1.40 mmol/L   HCT 34.0 (L) 36 - 46 %   Hemoglobin 11.6 (L) 12.0 - 15.0 g/dL   Sample type VENOUS     No results found.  ROS:ROS 12 systems reviewed and negative except as stated in HPI   Height 5\' 6"  (1.676 m), weight 52.2 kg.  PHYSICAL EXAM: Patient evaluated in the operating room for emergency airway management.  She had significant swelling of the anterior floor of mouth and submental region with trismus.  Under direct visualization with a glide laryngoscope the patient was intubated with a 6.5 endotracheal tube without difficulty or complication.  Airway secure, no respiratory difficulty, patient ventilating well.  Studies Reviewed:None  Assessment/Plan: Patient admitted to [facility] operating room for emergency airway management.  Patient's airway secured by anesthesia team under glide laryngoscope.  Patient will be admitted to critical care medicine for acute management of her infection.  Plan continued antibiotics and airway management.  Patient for schedule CT scan to assess floor of mouth for possible abscess.  She may be a candidate for incision and drainage if appropriate.  Recommend evaluation by dental/oral surgery for additional management of her chronic dental infection.  We will follow.  Jerrell Belfast 05/19/2020, 5:44 PM

## 2020-05-19 NOTE — ED Notes (Signed)
Transported to OR

## 2020-05-19 NOTE — H&P (Signed)
NAME:  Katherine [name], MRN:  [id number], DOB:  09-29-1941, LOS: 0 ADMISSION DATE:  05/19/2020, CONSULTATION DATE: 05/19/2020 REFERRING MD: Dr. Ronnald Nian, CHIEF COMPLAINT: Facial swelling pain  Brief History   79 year old female, past medical history hypertension hyperlipidemia, type 2 diabetes not taking any medications for this at this time.  Underwent recent tooth extraction on Friday now presents with lower facial pain concerning for ludwigs angina pulmonary critical care consulted for to the intensive care unit.  History of present illness   79 year old female, past medical history hypertension, hyperlipidemia, type 2 diabetes not currently taking Metformin has been on the past.  Underwent recent tooth extraction on this past Friday presents with lower facial pain and swelling along the jaw and anterior neck concerning for Ludwigs angina.  Decision made in the ER for transfer to the operating room and assessment by ear nose and throat as well as anesthesia.  Concern for airway compromise and decision made for endotracheal intubation.  Patient has been started on antimicrobials to include Unasyn.  Postoperatively patient to be managed in the intensive care unit.  Pulmonary critical care consulted for recommendations and management.  Past Medical History   Past Medical History:  Diagnosis Date  . Essential tremor 09/25/2019  . Exotropia of right eye    AND HYPERTROPIA  . Glaucoma   . Hyperlipidemia   . Hypertension   . Incomplete right bundle branch block   . LAFB (left anterior fascicular block)   . SUI (stress urinary incontinence, female)   . Type 2 diabetes mellitus ([place])   . Wears dentures    upper denture and lower partial denture  . Wears glasses      Significant Hospital Events   05/19/2020: Endotracheal intubation, and operating room with ENT and anesthesiology  Consults:  Pulmonary critical care Ear nose and throat Anesthesia  Procedures:  05/19/2020: Endotracheal  intubation in operating room with ENT and anesthesiology  Significant Diagnostic Tests:  CT soft tissue neck: Pending  Micro Data:  None  Antimicrobials:  Unasyn  Interim history/subjective:  Please see HPI above  Objective   Height 5\' 6"  (1.676 m), weight 52.2 kg.       No intake or output data in the 24 hours ending 05/19/20 1713 Filed Weights   05/19/20 1614  Weight: 52.2 kg    Examination: General: Elderly female, resting comfortably in bed, muffled voice slight drooling HENT: Swelling of the lower jaw underneath tongue and neck, some visible redness color change of the skin concerning for infection Lungs: Clear to auscultation bilaterally no crackles no wheeze Cardiovascular: Tachycardic, S1-S2 Abdomen: Soft nontender nondistended Extremities: No significant edema, no cyanosis Neuro: Oriented following commands no focal deficit GU: Deferred  Resolved Hospital Problem list     Assessment & Plan:   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 Adult ventilator protocol postoperatively PAD guidelines for sedation As needed fentanyl, Precedex CBGs with SSI, if needed insulin drip   Best practice:  Diet: N.p.o. Pain/Anxiety/Delirium protocol (if indicated): Precedex, fentanyl VAP protocol (if indicated): Yes DVT prophylaxis: Heparin GI prophylaxis: H2 blocker Glucose control: CBGs with SSI Mobility: Bedrest Code Status: Full code Family Communication: Dated husband at  bedside Disposition: ICU postop  Labs   CBC: Recent Labs  Lab 05/19/20 1629 05/19/20 1640 05/19/20 1706  WBC 20.1*  --    --   NEUTROABS PENDING  --   --   HGB 10.0* 10.9* 11.6*  HCT 31.7* 32.0* 34.0*  MCV 89.3  --   --   PLT 238  --   --     Basic Metabolic Panel: Recent Labs  Lab 05/19/20 1640 05/19/20 1706  NA 138 138  K 4.0 4.1  CL 99  --   GLUCOSE 553*  --   BUN 34*  --   CREATININE 1.20*  --    GFR: Estimated Creatinine Clearance: 32.4 mL/min (A) (by C-G formula based on SCr of 1.2 mg/dL (H)). Recent Labs  Lab 05/19/20 1629 05/19/20 1630  WBC 20.1*  --   LATICACIDVEN  --  1.8    Liver Function Tests: No results for input(s): AST, ALT, ALKPHOS, BILITOT, PROT, ALBUMIN in the last 168 hours. No results for input(s): LIPASE, AMYLASE in the last 168 hours. No results for input(s): AMMONIA in the last 168 hours.  ABG    Component Value Date/Time   HCO3 23.6 05/19/2020 1706   TCO2 25 05/19/2020 1706   O2SAT 86.0 05/19/2020 1706     Coagulation Profile: No results for input(s): INR, PROTIME in the last 168 hours.  Cardiac Enzymes: No results for input(s): CKTOTAL, CKMB, CKMBINDEX, TROPONINI in the last 168 hours.  HbA1C: Hgb A1c MFr Bld  Date/Time Value Ref Range Status  06/25/2012 09:59 AM 6.5 (H) <5.7 % Final    Comment:    (NOTE)                                                                       According to the ADA Clinical Practice Recommendations for 2011, when HbA1c is used as a screening test:  >=6.5%   Diagnostic of Diabetes Mellitus           (if abnormal result is confirmed) 5.7-6.4%   Increased risk of developing Diabetes Mellitus References:Diagnosis and Classification of Diabetes Mellitus,Diabetes KZSW,1093,23(FTDDU 1):S62-S69 and Standards of Medical Care in         Diabetes - 2011,Diabetes KGUR,4270,62 (Suppl 1):S11-S61.    CBG: No results for input(s): GLUCAP in the last 168 hours.  Review of Systems:   Review of Systems  Constitutional: Positive for chills, fever and malaise/fatigue. Negative for weight loss.  HENT: Negative for hearing loss,  sore throat and tinnitus.        Throat pain and swelling   Eyes: Negative for blurred vision and double vision.  Respiratory: Negative for cough, hemoptysis, sputum production, shortness of breath, wheezing and stridor.   Cardiovascular: Negative for chest pain, palpitations, orthopnea, leg swelling and PND.  Gastrointestinal: Negative for abdominal pain, constipation, diarrhea, heartburn, nausea and vomiting.  Genitourinary: Negative for dysuria, hematuria and urgency.  Musculoskeletal: Negative for joint pain and myalgias.  Skin: Negative for itching and rash.  Neurological: Negative for dizziness, tingling, weakness and headaches.  Endo/Heme/Allergies: Negative for environmental allergies. Does not bruise/bleed easily.  Psychiatric/Behavioral: Negative for depression. The patient is not nervous/anxious and does not have insomnia.   All other systems reviewed and are negative.  Past Medical History  She,  has a past medical history of Essential tremor (09/25/2019), Exotropia of right eye, Glaucoma, Hyperlipidemia, Hypertension, Incomplete right bundle branch block, LAFB (left anterior fascicular block), SUI (stress urinary incontinence, female), Type 2 diabetes mellitus ([place]), Wears dentures, and Wears glasses.   Surgical History    Past Surgical History:  Procedure Laterality Date  . ADJUSTABLE SUTURE MANIPULATION Right 03/29/2016   Procedure: ADJUSTABLE SUTURE RIGHT EYE LATERAL RECTUS RESECTION RIGHT EYE ;  Surgeon: Gevena Cotton, MD;  Location: [facility];  Service: Ophthalmology;  Laterality: Right;  . CHOLECYSTECTOMY OPEN  1968  . EYE SURGERY  age 24   repair right eye injury  . GLAUCOMA SURGERY Right 08-23-2015  at [facility]  . MEDIAN RECTUS REPAIR Right 03/29/2016   Procedure: MEDIAN RECTUS RESECTION RIGHT EYE ADJUSTABLE SUTURE RIGHT EYE LATERAL RECTUS RESECTION RIGHT EYE ;  Surgeon: Gevena Cotton, MD;  Location: [facility];  Service:  Ophthalmology;  Laterality: Right;     Social History   reports that she has been smoking cigarettes. She has a 2.50 pack-year smoking history. She has never used smokeless tobacco. She reports current alcohol use. She reports that she does not use drugs.   Family History   Her family history includes COPD in her brother; Emphysema in her father; Heart failure in her father and sister.   Allergies No Known Allergies   Home Medications  Prior to Admission medications   Medication Sig Start Date End Date Taking? Authorizing Provider  acetaminophen (TYLENOL) 500 MG tablet Take 500-1,000 mg by mouth every 6 (six) hours as needed (for pain).   Yes [provider]  ibuprofen (ADVIL) 200 MG tablet Take 400 mg by mouth every 6 (six) hours as needed (for pain).   Yes [provider]  mirtazapine (REMERON) 30 MG tablet TAKE 1 TABLET(30 MG) BY MOUTH AT BEDTIME Patient taking differently: Take 30 mg by mouth at bedtime as needed (for sleep).  01/27/20  Yes Kathrynn Ducking, MD  Multiple Vitamin (MULTIVITAMIN) tablet Take 1 tablet by mouth 3 (three) times a week.    Yes [provider]  rosuvastatin (CRESTOR) 10 MG tablet TAKE 1 TABLET(10 MG) BY MOUTH DAILY Patient taking differently: Take 10 mg by mouth every evening.  04/16/20  Yes Patwardhan, Manish J, MD  timolol (TIMOPTIC) 0.5 % ophthalmic solution Place 1 drop into both eyes 2 (two) times daily.  05/18/20  Yes [provider]  citalopram (CELEXA) 10 MG tablet  01/23/20   [provider]  lisinopril (ZESTRIL) 5 MG tablet Take 5 mg by mouth daily. Patient not taking: Reported on 05/19/2020    [provider]  metFORMIN (GLUCOPHAGE) 500 MG tablet Take 500 mg by mouth 2 (two) times daily. Patient not taking: Reported on 05/19/2020 09/05/19   [provider]      This patient is critically ill with multiple organ system failure; which, requires frequent high complexity decision making,  assessment, support, evaluation, and titration of therapies. This was completed through the application of advanced monitoring technologies and extensive interpretation of multiple databases. During this encounter critical care time was devoted to patient care services described in this note for 34 minutes.  [place] [facility] 05/19/2020 5:13 PM

## 2020-05-20 ENCOUNTER — Encounter (HOSPITAL_COMMUNITY): Payer: Self-pay | Admitting: Otolaryngology

## 2020-05-20 ENCOUNTER — Inpatient Hospital Stay (HOSPITAL_COMMUNITY): Payer: Medicare Other | Admitting: Certified Registered Nurse Anesthetist

## 2020-05-20 ENCOUNTER — Inpatient Hospital Stay (HOSPITAL_COMMUNITY): Payer: Medicare Other

## 2020-05-20 ENCOUNTER — Encounter (HOSPITAL_COMMUNITY)
Admission: EM | Disposition: A | Discharge status: Home Health | Payer: Self-pay | Source: Home / Self Care | Attending: Internal Medicine

## 2020-05-20 HISTORY — PX: INCISION AND DRAINAGE ABSCESS: SHX5864

## 2020-05-20 LAB — BASIC METABOLIC PANEL
Anion gap: 13 (ref 5–15)
Anion gap: 10 (ref 5–15)
Anion gap: 8 (ref 5–15)
Anion gap: 9 (ref 5–15)
BUN: 19 mg/dL (ref 8–23)
BUN: 17 mg/dL (ref 8–23)
BUN: 15 mg/dL (ref 8–23)
BUN: 15 mg/dL (ref 8–23)
CO2: 18 mmol/L — ABNORMAL LOW (ref 22–32)
CO2: 21 mmol/L — ABNORMAL LOW (ref 22–32)
CO2: 21 mmol/L — ABNORMAL LOW (ref 22–32)
CO2: 21 mmol/L — ABNORMAL LOW (ref 22–32)
Calcium: 8.4 mg/dL — ABNORMAL LOW (ref 8.9–10.3)
Calcium: 8.4 mg/dL — ABNORMAL LOW (ref 8.9–10.3)
Calcium: 8.3 mg/dL — ABNORMAL LOW (ref 8.9–10.3)
Calcium: 8.2 mg/dL — ABNORMAL LOW (ref 8.9–10.3)
Chloride: 110 mmol/L (ref 98–111)
Chloride: 110 mmol/L (ref 98–111)
Chloride: 111 mmol/L (ref 98–111)
Chloride: 110 mmol/L (ref 98–111)
Creatinine, Ser: 0.75 mg/dL (ref 0.44–1.00)
Creatinine, Ser: 0.72 mg/dL (ref 0.44–1.00)
Creatinine, Ser: 0.71 mg/dL (ref 0.44–1.00)
Creatinine, Ser: 0.67 mg/dL (ref 0.44–1.00)
GFR calc Af Amer: >60 mL/min (ref >60)
GFR calc Af Amer: >60 mL/min (ref >60)
GFR calc Af Amer: >60 mL/min (ref >60)
GFR calc Af Amer: >60 mL/min (ref >60)
GFR calc non Af Amer: >60 mL/min (ref >60)
GFR calc non Af Amer: >60 mL/min (ref >60)
GFR calc non Af Amer: >60 mL/min (ref >60)
GFR calc non Af Amer: >60 mL/min (ref >60)
Glucose, Bld: 137 mg/dL — ABNORMAL HIGH (ref 70–99)
Glucose, Bld: 172 mg/dL — ABNORMAL HIGH (ref 70–99)
Glucose, Bld: 232 mg/dL — ABNORMAL HIGH (ref 70–99)
Glucose, Bld: 257 mg/dL — ABNORMAL HIGH (ref 70–99)
Potassium: 3.9 mmol/L (ref 3.5–5.1)
Potassium: 3.8 mmol/L (ref 3.5–5.1)
Potassium: 3.4 mmol/L — ABNORMAL LOW (ref 3.5–5.1)
Potassium: 3.5 mmol/L (ref 3.5–5.1)
Sodium: 141 mmol/L (ref 135–145)
Sodium: 141 mmol/L (ref 135–145)
Sodium: 140 mmol/L (ref 135–145)
Sodium: 140 mmol/L (ref 135–145)

## 2020-05-20 LAB — GLUCOSE, CAPILLARY
Glucose-Capillary: 133 mg/dL — ABNORMAL HIGH (ref 70–99)
Glucose-Capillary: 134 mg/dL — ABNORMAL HIGH (ref 70–99)
Glucose-Capillary: 140 mg/dL — ABNORMAL HIGH (ref 70–99)
Glucose-Capillary: 141 mg/dL — ABNORMAL HIGH (ref 70–99)
Glucose-Capillary: 144 mg/dL — ABNORMAL HIGH (ref 70–99)
Glucose-Capillary: 144 mg/dL — ABNORMAL HIGH (ref 70–99)
Glucose-Capillary: 145 mg/dL — ABNORMAL HIGH (ref 70–99)
Glucose-Capillary: 151 mg/dL — ABNORMAL HIGH (ref 70–99)
Glucose-Capillary: 157 mg/dL — ABNORMAL HIGH (ref 70–99)
Glucose-Capillary: 157 mg/dL — ABNORMAL HIGH (ref 70–99)
Glucose-Capillary: 159 mg/dL — ABNORMAL HIGH (ref 70–99)
Glucose-Capillary: 161 mg/dL — ABNORMAL HIGH (ref 70–99)
Glucose-Capillary: 167 mg/dL — ABNORMAL HIGH (ref 70–99)
Glucose-Capillary: 168 mg/dL — ABNORMAL HIGH (ref 70–99)
Glucose-Capillary: 171 mg/dL — ABNORMAL HIGH (ref 70–99)
Glucose-Capillary: 175 mg/dL — ABNORMAL HIGH (ref 70–99)
Glucose-Capillary: 176 mg/dL — ABNORMAL HIGH (ref 70–99)
Glucose-Capillary: 181 mg/dL — ABNORMAL HIGH (ref 70–99)
Glucose-Capillary: 186 mg/dL — ABNORMAL HIGH (ref 70–99)
Glucose-Capillary: 188 mg/dL — ABNORMAL HIGH (ref 70–99)
Glucose-Capillary: 199 mg/dL — ABNORMAL HIGH (ref 70–99)
Glucose-Capillary: 425 mg/dL — ABNORMAL HIGH (ref 70–99)

## 2020-05-20 LAB — SURGICAL PCR SCREEN
MRSA, PCR: NEGATIVE
Staphylococcus aureus: NEGATIVE

## 2020-05-20 LAB — CBC
HCT: 26.7 % — ABNORMAL LOW (ref 36.0–46.0)
Hemoglobin: 8.1 g/dL — ABNORMAL LOW (ref 12.0–15.0)
MCH: 28.3 pg (ref 26.0–34.0)
MCHC: 30.3 g/dL (ref 30.0–36.0)
MCV: 93.4 fL (ref 80.0–100.0)
Platelets: 174 10*3/uL (ref 150–400)
RBC: 2.86 MIL/uL — ABNORMAL LOW (ref 3.87–5.11)
RDW: 14 % (ref 11.5–15.5)
WBC: 17.8 10*3/uL — ABNORMAL HIGH (ref 4.0–10.5)
nRBC: 0 % (ref 0.0–0.2)

## 2020-05-20 LAB — LACTIC ACID, PLASMA: Lactic Acid, Venous: 2.1 mmol/L (ref 0.5–1.9)

## 2020-05-20 LAB — BETA-HYDROXYBUTYRIC ACID
Beta-Hydroxybutyric Acid: 0.05 mmol/L (ref 0.05–0.27)
Beta-Hydroxybutyric Acid: <0.05 mmol/L — ABNORMAL LOW (ref 0.05–0.27)

## 2020-05-20 SURGERY — INCISION AND DRAINAGE, ABSCESS
Anesthesia: General | Site: Neck

## 2020-05-20 MED ORDER — INSULIN DETEMIR 100 UNIT/ML ~~LOC~~ SOLN
15.0000 [IU] | Freq: Two times a day (BID) | SUBCUTANEOUS | Status: DC
  Administered 2020-05-21 (×2): 15 [IU] via SUBCUTANEOUS
  Filled 2020-05-20 (×4): qty 0.15

## 2020-05-20 MED ORDER — INSULIN DETEMIR 100 UNIT/ML ~~LOC~~ SOLN
0.3000 [IU]/kg | SUBCUTANEOUS | Status: DC
  Administered 2020-05-20: 17 [IU] via SUBCUTANEOUS
  Filled 2020-05-20: qty 0.17

## 2020-05-20 MED ORDER — INSULIN ASPART 100 UNIT/ML ~~LOC~~ SOLN
2.0000 [IU] | SUBCUTANEOUS | Status: DC
  Administered 2020-05-20 – 2020-05-21 (×6): 4 [IU] via SUBCUTANEOUS
  Administered 2020-05-21: 2 [IU] via SUBCUTANEOUS
  Administered 2020-05-22: 6 [IU] via SUBCUTANEOUS
  Administered 2020-05-22: 4 [IU] via SUBCUTANEOUS
  Administered 2020-05-22 (×3): 6 [IU] via SUBCUTANEOUS

## 2020-05-20 MED ORDER — MIDAZOLAM HCL 5 MG/5ML IJ SOLN
INTRAMUSCULAR | Status: DC | PRN
  Administered 2020-05-20: 2 mg via INTRAVENOUS

## 2020-05-20 MED ORDER — ALBUMIN HUMAN 5 % IV SOLN
25.0000 g | Freq: Once | INTRAVENOUS | Status: AC
  Administered 2020-05-20: 25 g via INTRAVENOUS
  Filled 2020-05-20: qty 500

## 2020-05-20 MED ORDER — CHLORHEXIDINE GLUCONATE 0.12% ORAL RINSE (MEDLINE KIT)
15.0000 mL | Freq: Two times a day (BID) | OROMUCOSAL | Status: DC
  Administered 2020-05-20 – 2020-05-25 (×11): 15 mL via OROMUCOSAL

## 2020-05-20 MED ORDER — PHENYLEPHRINE 40 MCG/ML (10ML) SYRINGE FOR IV PUSH (FOR BLOOD PRESSURE SUPPORT)
PREFILLED_SYRINGE | INTRAVENOUS | Status: DC | PRN
  Administered 2020-05-20 (×2): 80 ug via INTRAVENOUS

## 2020-05-20 MED ORDER — ONDANSETRON HCL 4 MG/2ML IJ SOLN
INTRAMUSCULAR | Status: DC | PRN
  Administered 2020-05-20: 4 mg via INTRAVENOUS

## 2020-05-20 MED ORDER — FENTANYL CITRATE (PF) 250 MCG/5ML IJ SOLN
INTRAMUSCULAR | Status: AC
  Filled 2020-05-20: qty 5

## 2020-05-20 MED ORDER — ROCURONIUM BROMIDE 10 MG/ML (PF) SYRINGE
PREFILLED_SYRINGE | INTRAVENOUS | Status: DC | PRN
  Administered 2020-05-20: 30 mg via INTRAVENOUS

## 2020-05-20 MED ORDER — 0.9 % SODIUM CHLORIDE (POUR BTL) OPTIME
TOPICAL | Status: DC | PRN
  Administered 2020-05-20: 1000 mL

## 2020-05-20 MED ORDER — FENTANYL CITRATE (PF) 100 MCG/2ML IJ SOLN
INTRAMUSCULAR | Status: DC | PRN
  Administered 2020-05-20: 100 ug via INTRAVENOUS
  Administered 2020-05-20: 50 ug via INTRAVENOUS

## 2020-05-20 MED ORDER — SUGAMMADEX SODIUM 200 MG/2ML IV SOLN
INTRAVENOUS | Status: DC | PRN
  Administered 2020-05-20: 200 mg via INTRAVENOUS

## 2020-05-20 MED ORDER — INSULIN ASPART 100 UNIT/ML ~~LOC~~ SOLN: 0.0000 [IU] | SUBCUTANEOUS | Status: DC

## 2020-05-20 MED ORDER — LIDOCAINE-EPINEPHRINE 1 %-1:100000 IJ SOLN
INTRAMUSCULAR | Status: AC
  Filled 2020-05-20: qty 1

## 2020-05-20 MED ORDER — LACTATED RINGERS IV SOLN: INTRAVENOUS | Status: DC | PRN

## 2020-05-20 MED ORDER — PROPOFOL 10 MG/ML IV BOLUS
INTRAVENOUS | Status: AC
  Filled 2020-05-20: qty 20

## 2020-05-20 MED ORDER — LACTATED RINGERS IV BOLUS
1000.0000 mL | Freq: Once | INTRAVENOUS | Status: AC
  Administered 2020-05-20: 1000 mL via INTRAVENOUS

## 2020-05-20 MED ORDER — DEXAMETHASONE SODIUM PHOSPHATE 10 MG/ML IJ SOLN
INTRAMUSCULAR | Status: AC
  Filled 2020-05-20: qty 1

## 2020-05-20 MED ORDER — INSULIN ASPART 100 UNIT/ML ~~LOC~~ SOLN: 0.0000 [IU] | Freq: Three times a day (TID) | SUBCUTANEOUS | Status: DC

## 2020-05-20 MED ORDER — MIDAZOLAM HCL 2 MG/2ML IJ SOLN
INTRAMUSCULAR | Status: AC
  Filled 2020-05-20: qty 2

## 2020-05-20 MED ORDER — ONDANSETRON HCL 4 MG/2ML IJ SOLN
INTRAMUSCULAR | Status: AC
  Filled 2020-05-20: qty 2

## 2020-05-20 MED ORDER — CHLORHEXIDINE GLUCONATE CLOTH 2 % EX PADS
6.0000 | MEDICATED_PAD | Freq: Every day | CUTANEOUS | Status: DC
  Administered 2020-05-20 – 2020-05-27 (×8): 6 via TOPICAL

## 2020-05-20 MED ORDER — GLYCOPYRROLATE PF 0.2 MG/ML IJ SOSY
PREFILLED_SYRINGE | INTRAMUSCULAR | Status: AC
  Filled 2020-05-20: qty 1

## 2020-05-20 MED ORDER — PHENYLEPHRINE HCL-NACL 10-0.9 MG/250ML-% IV SOLN
0.0000 ug/min | INTRAVENOUS | Status: DC
  Administered 2020-05-20: 13.3333 ug/min via INTRAVENOUS
  Administered 2020-05-21: 20 ug/min via INTRAVENOUS
  Administered 2020-05-22: 10 ug/min via INTRAVENOUS
  Administered 2020-05-22: 25 ug/min via INTRAVENOUS
  Administered 2020-05-23: 20 ug/min via INTRAVENOUS
  Filled 2020-05-20 (×5): qty 250

## 2020-05-20 MED ORDER — ORAL CARE MOUTH RINSE
15.0000 mL | OROMUCOSAL | Status: DC
  Administered 2020-05-20 – 2020-05-25 (×53): 15 mL via OROMUCOSAL

## 2020-05-20 MED ORDER — PHENYLEPHRINE 40 MCG/ML (10ML) SYRINGE FOR IV PUSH (FOR BLOOD PRESSURE SUPPORT)
PREFILLED_SYRINGE | INTRAVENOUS | Status: AC
  Filled 2020-05-20: qty 10

## 2020-05-20 MED ORDER — ROCURONIUM BROMIDE 10 MG/ML (PF) SYRINGE
PREFILLED_SYRINGE | INTRAVENOUS | Status: AC
  Filled 2020-05-20: qty 10

## 2020-05-20 MED ORDER — DEXAMETHASONE SODIUM PHOSPHATE 10 MG/ML IJ SOLN
INTRAMUSCULAR | Status: DC | PRN
  Administered 2020-05-20: 4 mg via INTRAVENOUS

## 2020-05-20 MED ORDER — LIDOCAINE 2% (20 MG/ML) 5 ML SYRINGE
INTRAMUSCULAR | Status: AC
  Filled 2020-05-20: qty 5

## 2020-05-20 MED ORDER — DEXTROSE-NACL 5-0.45 % IV SOLN: INTRAVENOUS | Status: DC

## 2020-05-20 MED ORDER — BACITRACIN ZINC 500 UNIT/GM EX OINT
TOPICAL_OINTMENT | CUTANEOUS | Status: AC
  Filled 2020-05-20: qty 28.35

## 2020-05-20 MED ORDER — INSULIN DETEMIR 100 UNIT/ML ~~LOC~~ SOLN
15.0000 [IU] | Freq: Two times a day (BID) | SUBCUTANEOUS | Status: DC
  Filled 2020-05-20: qty 0.15

## 2020-05-20 SURGICAL SUPPLY — 31 items
BNDG CONFORM 2 STRL LF (GAUZE/BANDAGES/DRESSINGS) IMPLANT
CANISTER SUCT 3000ML PPV (MISCELLANEOUS) ×2 IMPLANT
CLEANER TIP ELECTROSURG 2X2 (MISCELLANEOUS) ×3 IMPLANT
COVER SURGICAL LIGHT HANDLE (MISCELLANEOUS) ×3 IMPLANT
COVER WAND RF STERILE (DRAPES) ×3 IMPLANT
DRAIN PENROSE 1/2X12 LTX STRL (WOUND CARE) ×2 IMPLANT
DRAIN PENROSE 1/4X12 LTX STRL (WOUND CARE) IMPLANT
DRAPE HALF SHEET 40X57 (DRAPES) ×2 IMPLANT
DRSG TUBE GAUZE 1X5YD SZ2 (GAUZE/BANDAGES/DRESSINGS) ×2 IMPLANT
ELECT COATED BLADE 2.86 ST (ELECTRODE) ×3 IMPLANT
ELECT NDL TIP 2.8 STRL (NEEDLE) IMPLANT
ELECT NEEDLE TIP 2.8 STRL (NEEDLE) IMPLANT
ELECT REM PT RETURN 9FT ADLT (ELECTROSURGICAL) ×3
ELECTRODE REM PT RTRN 9FT ADLT (ELECTROSURGICAL) ×1 IMPLANT
GAUZE 4X4 16PLY RFD (DISPOSABLE) ×3 IMPLANT
GAUZE SPONGE 4X4 12PLY STRL LF (GAUZE/BANDAGES/DRESSINGS) ×4 IMPLANT
GLOVE ECLIPSE 7.5 STRL STRAW (GLOVE) ×3 IMPLANT
GOWN STRL REUS W/ TWL LRG LVL3 (GOWN DISPOSABLE) ×2 IMPLANT
GOWN STRL REUS W/TWL LRG LVL3 (GOWN DISPOSABLE) ×6
KIT BASIN OR (CUSTOM PROCEDURE TRAY) ×3 IMPLANT
KIT TURNOVER KIT B (KITS) ×3 IMPLANT
NS IRRIG 1000ML POUR BTL (IV SOLUTION) ×3 IMPLANT
PAD ARMBOARD 7.5X6 YLW CONV (MISCELLANEOUS) ×6 IMPLANT
PENCIL FOOT CONTROL (ELECTRODE) ×3 IMPLANT
SUT SILK 2 0 PERMA HAND 18 BK (SUTURE) ×2 IMPLANT
SUT SILK 4 0 REEL (SUTURE) ×3 IMPLANT
SWAB COLLECTION DEVICE MRSA (MISCELLANEOUS) ×2 IMPLANT
SWAB CULTURE ESWAB REG 1ML (MISCELLANEOUS) ×2 IMPLANT
SYR BULB IRRIG 60ML STRL (SYRINGE) ×2 IMPLANT
TOWEL GREEN STERILE FF (TOWEL DISPOSABLE) ×3 IMPLANT
TRAY ENT MC OR (CUSTOM PROCEDURE TRAY) ×3 IMPLANT

## 2020-05-20 NOTE — Progress Notes (Signed)
Gave entire 147mcg of fentanyl over several administrations which are documented in [facility]. Pyxis still prompted me to "waste" med which was given showed admins to [employer] and we documented the "waste" because the medication had already been given.

## 2020-05-20 NOTE — Progress Notes (Signed)
To OR with surgical team & respiratory for vent management

## 2020-05-20 NOTE — Progress Notes (Signed)
Notified E-link patient is still hypotensive. Also informed them endotool prompted to start IV fluids with dextrose since BG is less than 250. Asked E-link for fluid order.

## 2020-05-20 NOTE — Progress Notes (Signed)
Notified e-link patient still hypotensive BP 95/33(51) despite 1L bolus.

## 2020-05-20 NOTE — Anesthesia Preprocedure Evaluation (Signed)
Anesthesia Evaluation  Patient identified by MRN, date of birth, ID band Patient awake    Reviewed: Allergy & Precautions, H&P , NPO status , Patient's Chart, lab work & pertinent test results  Airway Mallampati: Intubated  TM Distance: >3 FB Neck ROM: Limited  Mouth opening: Limited Mouth Opening  Dental no notable dental hx. (+) Teeth Intact, Dental Advisory Given   Pulmonary Current Smoker,    Pulmonary exam normal breath sounds clear to auscultation       Cardiovascular hypertension, Pt. on medications + dysrhythmias  Rhythm:Regular Rate:Normal     Neuro/Psych negative neurological ROS  negative psych ROS   GI/Hepatic negative GI ROS, Neg liver ROS,   Endo/Other  diabetes, Poorly Controlled, Type 2, Oral Hypoglycemic Agents  Renal/GU negative Renal ROS  negative genitourinary   Musculoskeletal   Abdominal   Peds  Hematology  (+) anemia ,   Anesthesia Other Findings   Reproductive/Obstetrics negative OB ROS                             Anesthesia Physical  Anesthesia Plan  ASA: III  Anesthesia Plan: General   Post-op Pain Management:    Induction: Intravenous  PONV Risk Score and Plan: 2 and Dexamethasone, Ondansetron and Treatment may vary due to age or medical condition  Airway Management Planned: Oral ETT  Additional Equipment:   Intra-op Plan:   Post-operative Plan: Post-operative intubation/ventilation  Informed Consent: I have reviewed the patients History and Physical, chart, labs and discussed the procedure including the risks, benefits and alternatives for the proposed anesthesia with the patient or authorized representative who has indicated his/her understanding and acceptance.     Dental advisory given  Plan Discussed with: CRNA  Anesthesia Plan Comments:         Anesthesia Quick Evaluation

## 2020-05-20 NOTE — Anesthesia Postprocedure Evaluation (Signed)
Anesthesia Post Note  Patient: Katherine [name]  Procedure(s) Performed: INCISION AND DRAINAGE OF NECK ABSCESS (N/A Neck)     Patient location during evaluation: SICU Anesthesia Type: General Level of consciousness: sedated Pain management: pain level controlled Vital Signs Assessment: post-procedure vital signs reviewed and stable Respiratory status: patient remains intubated per anesthesia plan Cardiovascular status: stable Postop Assessment: no apparent nausea or vomiting Anesthetic complications: no   No complications documented.  Last Vitals:  Vitals:   05/20/20 1500 05/20/20 1515  BP: (!) 105/50 (!) 106/45  Pulse: 75 76  Resp: 16 16  Temp:    SpO2: 100% 100%    Last Pain:  Vitals:   05/20/20 1200  TempSrc: Axillary  PainSc:                  Tiajuana Amass

## 2020-05-20 NOTE — Progress Notes (Signed)
Per Dr. Lucile Shutters start D5-1/2NS as well as LR maintenance fluids once second LR bolus is completed.

## 2020-05-20 NOTE — Anesthesia Procedure Notes (Signed)
Date/Time: 05/20/2020 12:44 PM Performed by: Orlie Dakin, CRNA Pre-anesthesia Checklist: Patient identified, Emergency Drugs available, Suction available and Patient being monitored Oxygen Delivery Method: Circle system utilized Induction Type: Inhalational induction and Inhalational induction with existing ETT Placement Confirmation: positive ETCO2 Comments: Transported with 100 % O2 via ambu-bag and portable monitors, existing ETT.  Connected to vent and anesthesia machine in OR.

## 2020-05-20 NOTE — Progress Notes (Signed)
[facility] Progress Note Patient Name: Katherine [name] DOB: Aug 04, 1941 MRN: [id number]   Date of Service  05/20/2020  HPI/Events of Note  Hypotension improved but still present.  eICU Interventions  Phenylephrine max dose increased to 400 mcg, PCCM bedside requested to place a central line for vasopressor access, Albumin 5 % 500 ml iv fluid bolus x 1.        Kerry Kass Jonh Mcqueary 05/20/2020, 2:39 AM

## 2020-05-20 NOTE — Op Note (Signed)
OPERATIVE REPORT  DATE OF SURGERY: 05/20/2020  PATIENT:  Katherine [name],  79 y.o. female  PRE-OPERATIVE DIAGNOSIS:  neck abscess  POST-OPERATIVE DIAGNOSIS:  * No post-op diagnosis entered *  PROCEDURE:  Procedure(s): INCISION AND DRAINAGE OF NECK ABSCESS  SURGEON:  Beckie Salts, MD  ASSISTANTS: None  ANESTHESIA:   General   EBL: 20 ml  DRAINS: Half-inch Penrose x2  LOCAL MEDICATIONS USED:  None  SPECIMEN: Sample taken for culture and sensitivity testing  COUNTS:  Correct  PROCEDURE DETAILS: The patient was taken to the operating room and placed on the operating table in the supine position. Following induction of general endotracheal anesthesia, patient previously intubated emergently, the neck was prepped and draped in a standard fashion.  A transverse incision was outlined with a marking pen parallel to the anterior mandible extending over towards the left side.  Electrocautery was used to incise the skin and subcutaneous tissue.  Blunt dissection was performed using a hemostat to enter into the abscess cavity.  A large amount of dark cloudy fluid was obtained.  Samples were sent for aerobic and anaerobic culture and sensitivity testing.  Additional dissection using hemostat suction and digital dissection was used to open all loculations.  Abscess cavity extended from the posterior border of the anterior mandible, down towards the midline suprahyoid neck, to the left along the mandible and into the submandibular triangle.  The abscess cavity was irrigated with saline.  Half-inch Penrose was placed 1 over towards the left submandibular region and the second 1 up towards the inner surface of the anterior mandibular arch.  These were secured in place using a nylon suture and sterile safety pin.  Dressing was applied.  Patient will be transferred back to intensive care unit in satisfactory condition.    PATIENT DISPOSITION:  To PACU, stable

## 2020-05-20 NOTE — Transfer of Care (Signed)
Immediate Anesthesia Transfer of Care Note  Patient: Katherine [name]  Procedure(s) Performed: INCISION AND DRAINAGE OF NECK ABSCESS (N/A )  Patient Location: ICU  Anesthesia Type:General  Level of Consciousness: sedated and Patient remains intubated per anesthesia plan  Airway & Oxygen Therapy: Patient remains intubated per anesthesia plan and Patient placed on Ventilator (see vital sign flow sheet for setting)  Post-op Assessment: Report given to RN and Post -op Vital signs reviewed and stable  Post vital signs: Reviewed and stable  Last Vitals:  Vitals Value Taken Time  BP    Temp    Pulse    Resp    SpO2      Last Pain:  Vitals:   05/20/20 1200  TempSrc: Axillary  PainSc:          Complications: No complications documented.

## 2020-05-20 NOTE — Anesthesia Postprocedure Evaluation (Signed)
Anesthesia Post Note  Patient: Violia Knopf  Procedure(s) Performed: INTUBATION-ENDOTRACHEAL WITH TRACHEOSTOMY STANDBY (N/A Throat)     Patient location during evaluation: ICU Anesthesia Type: General Level of consciousness: sedated Pain management: pain level controlled Vital Signs Assessment: post-procedure vital signs reviewed and stable Respiratory status: patient remains intubated per anesthesia plan Cardiovascular status: stable Postop Assessment: no apparent nausea or vomiting Anesthetic complications: no   No complications documented.  Last Vitals:  Vitals:   05/20/20 0543 05/20/20 0600  BP: (!) 113/53 (!) 109/47  Pulse: 100 75  Resp: 20 16  Temp:    SpO2: 98% 100%    Last Pain:  Vitals:   05/20/20 0400  TempSrc: Oral  PainSc:                  Danish Ruffins,W. EDMOND

## 2020-05-20 NOTE — Interval H&P Note (Signed)
History and Physical Interval Note:  05/20/2020 12:55 PM  Katherine [name]  has presented today for surgery, with the diagnosis of neck abscess.  The patient is intubated and sedated, family members could not be reached.  This surgery is deemed emergent and we will proceed on emergent basis.  Procedure(s): INCISION AND DRAINAGE OF NECK ABSCESS (N/A) as a surgical intervention.  The patient's history has been reviewed, patient examined, no change in status, stable for surgery.  I have reviewed the patient's chart and labs.       Izora Gala

## 2020-05-20 NOTE — Interval H&P Note (Deleted)
History and Physical Interval Note:  05/20/2020 11:58 AM  Katherine [name]  has presented today for surgery, with the diagnosis of neck abscess.  The various methods of treatment have been discussed with the patient and family. After consideration of risks, benefits and other options for treatment, the patient has consented to  Procedure(s): INCISION AND DRAINAGE OF NECK ABSCESS (N/A) as a surgical intervention.  The patient's history has been reviewed, patient examined, no change in status, stable for surgery.  I have reviewed the patient's chart and labs.  Questions were answered to the patient's satisfaction.     Izora Gala

## 2020-05-20 NOTE — Progress Notes (Signed)
[facility] Progress Note Patient Name: Katherine [name] DOB: [birth year] MRN: [id number]   Date of Service  05/20/2020  HPI/Events of Note  Patient with profound hypotension despite an initial round of aggressive fluid resuscitation, my suspicion is that she is under-resuscitated.  eICU Interventions  I've ordered another liter of LR, Will  Start a low dose  Phenylephrine infusion, will start D 5 % 1/2  NS  Infusion at 125 ml / hour  Per endotool, while keeping the LR infusing         Noelle Sease U Madellyn Denio 05/20/2020, 1:06 AM

## 2020-05-20 NOTE — Progress Notes (Signed)
Endotool prompted to enter anion gap value. No current value in system, called Phlebotomy to ask them to draw AM labs early.

## 2020-05-20 NOTE — Progress Notes (Signed)
NAME:  Katherine [name], MRN:  [id number], DOB:  01-Jul-1941, LOS: 1 ADMISSION DATE:  05/19/2020, CONSULTATION DATE:  05/20/20 REFERRING MD:  Dr. Ronnald Nian, CHIEF COMPLAINT: facial swelling and pain   Brief History   79 year old female, past medical history hypertension hyperlipidemia, type 2 diabetes not taking any medications for this at this time.  Underwent recent tooth extraction,  now presents with lower facial pain concerning for ludwigs angina followed by hypotension and airway compromise, ventilated.   History of present illness   79 year old F with hx of HTN, HLD, T2DM, who recently underwent tooth extraction and presented with symptoms consistent with ludwig's angina. Patient was transferred to MICU for airway compromise and severe hypotension, started on phenylephrine and ventilated.   Past Medical History   has a past medical history of Essential tremor (09/25/2019), Exotropia of right eye, Glaucoma, Hyperlipidemia, Hypertension, Incomplete right bundle branch block, LAFB (left anterior fascicular block), SUI (stress urinary incontinence, female), Type 2 diabetes mellitus ([place]), Wears dentures, and Wears glasses.  Significant Hospital Events   7/7: ETT   Consults:  CCM  ENT Anesthesia   Procedures:  Endotracheal intubation   Significant Diagnostic Tests:  CT Soft Tissue and Neck: 1. Trans-spatial gas and fluid containing abscess of the floor of mouth, measuring 4.5 x 4.7 cm. 2. Small subperiosteal abscess along the internal surface of the right mandibular body, near an empty posterior molar socket.  Micro Data:  Blood Cultures Pending   Antimicrobials:  Unasyn 1.5g 7/7>>   Interim history/subjective:  Patient received 3 liters of LR and 1 L of NS, started on phenylephrine, on precedex and ventilator with pressure support   Objective   Blood pressure (!) 120/49, pulse 88, temperature 97.9 F (36.6 C), temperature source Axillary, resp. rate (!) 26, height 5\' 6"  (1.676  m), weight 56 kg, SpO2 100 %.    Vent Mode: PSV;CPAP FiO2 (%):  [40 %-100 %] 40 % Set Rate:  [16 bmp] 16 bmp Vt Set:  [470 mL] 470 mL PEEP:  [5 cmH20] 5 cmH20 Pressure Support:  [5 cmH20] 5 cmH20 Plateau Pressure:  [11 cmH20-15 cmH20] 15 cmH20   Intake/Output Summary (Last 24 hours) at 05/20/2020 1137 Last data filed at 05/20/2020 1100 Gross per 24 hour  Intake 5930.69 ml  Output 955 ml  Net 4975.69 ml   Filed Weights   05/19/20 1614 05/20/20 0457  Weight: 52.2 kg 56 kg    Examination: General: female appearing younger than stated age lying in bed HENT: MMM, ETT  Lungs: clear to auscultation, no wheezing or crackles, good aeration on ventilator  Cardiovascular: RRR without murmurs  Abdomen: soft, nontender with bowel sounds present  Extremities: no LE edema  Neuro: alert to voice, follows some commands, weakly moves extremities   Resolved Hospital Problem list     Assessment & Plan:   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   CBG monitoring  EndoTool  Check BMP and BHB every 4 hours until AG closes, then stop insulin gtt  Holding home metformin 500mg    HLD: will hold home Crestor 10mg    Hx of HTN  Newly developed hypotension  Patient developed hypotension requiring vasopressors. Patient on phenylephrine with BP ranges 61-137/33-107 overnight. Most recently 101/47.  -  plan to wean vasopressors as BP tolerates  -will hold home lisinopril given low pressures  Nutrition: NPO for now, consider starting tube feeds depending on duration expected to require ventilatory support  - SLP evaluation when airway edema decreases   Best practice:  Diet: NPO Pain/Anxiety/Delirium protocol (if indicated): on Precedex  VAP  protocol (if indicated): per protocol  DVT prophylaxis: Heparin  GI prophylaxis: Protonix  Glucose control: EndoTool insulin gtt  Mobility: bedrest  Code Status: Full code  Family Communication: attempted to contact patient's spouse via telephone at home and and mobile number with no response Disposition: ICU while ventilated and hypotensive   Labs   CBC: Recent Labs  Lab 05/19/20 1629 05/19/20 1640 05/19/20 1706 05/20/20 0312  WBC 20.1*  --   --  17.8*  NEUTROABS 14.5*  --   --   --   HGB 10.0* 10.9* 11.6* 8.1*  HCT 31.7* 32.0* 34.0* 26.7*  MCV 89.3  --   --  93.4  PLT 238  --   --  826    Basic Metabolic Panel: Recent Labs  Lab 05/19/20 1629 05/19/20 1640 05/19/20 1706 05/19/20 2212 05/20/20 0312  NA 135 138 138  --  141  K 4.0 4.0 4.1  --  3.9  CL 98 99  --   --  110  CO2 19*  --   --   --  18*  GLUCOSE 568* 553*  --   --  137*  BUN 33* 34*  --   --  19  CREATININE 1.53* 1.20*  --   --  0.75  CALCIUM 9.4  --   --   --  8.4*  MG  --   --   --  1.8  --   PHOS  --   --   --  1.4*  --    GFR: Estimated Creatinine Clearance: 52.1 mL/min (by C-G formula based on SCr of 0.75 mg/dL). Recent Labs  Lab 05/19/20 1629 05/19/20 1630 05/19/20 2212 05/20/20 0312  WBC 20.1*  --   --  17.8*  LATICACIDVEN  --  1.8 2.9*  --     Liver Function Tests: Recent Labs  Lab 05/19/20 1629  AST 23  ALT 26  ALKPHOS 148*  BILITOT 1.5*  PROT 6.8  ALBUMIN 3.0*   No results for input(s): LIPASE, AMYLASE in the last 168 hours. No results for input(s): AMMONIA in the last 168 hours.  ABG    Component Value Date/Time   HCO3 23.6 05/19/2020 1706   TCO2 25 05/19/2020 1706   O2SAT 86.0 05/19/2020 1706     Coagulation Profile: No results for input(s): INR, PROTIME in the last 168 hours.  Cardiac Enzymes: No results for input(s): CKTOTAL, CKMB, CKMBINDEX, TROPONINI in the last 168 hours.  HbA1C: Hgb A1c MFr Bld  Date/Time Value Ref Range Status  05/19/2020 10:12 PM  10.3 (H) 4.8 - 5.6 % Final    Comment:    (NOTE) Pre diabetes:          5.7%-6.4%  Diabetes:              >6.4%  Glycemic control for   <7.0% adults with diabetes   06/25/2012 09:59 AM 6.5 (H) <5.7 % Final    Comment:    (NOTE)  According to the ADA Clinical Practice Recommendations for 2011, when HbA1c is used as a screening test:  >=6.5%   Diagnostic of Diabetes Mellitus           (if abnormal result is confirmed) 5.7-6.4%   Increased risk of developing Diabetes Mellitus References:Diagnosis and Classification of Diabetes Mellitus,Diabetes CWCB,7628,31(DVVOH 1):S62-S69 and Standards of Medical Care in         Diabetes - 2011,Diabetes YWVP,7106,26 (Suppl 1):S11-S61.    CBG: Recent Labs  Lab 05/20/20 0555 05/20/20 0652 05/20/20 0804 05/20/20 0904 05/20/20 1013  GLUCAP 181* 188* 167* 176* 171*    Past Medical History  She,  has a past medical history of Essential tremor (09/25/2019), Exotropia of right eye, Glaucoma, Hyperlipidemia, Hypertension, Incomplete right bundle branch block, LAFB (left anterior fascicular block), SUI (stress urinary incontinence, female), Type 2 diabetes mellitus ([place]), Wears dentures, and Wears glasses.   Surgical History    Past Surgical History:  Procedure Laterality Date  . ADJUSTABLE SUTURE MANIPULATION Right 03/29/2016   Procedure: ADJUSTABLE SUTURE RIGHT EYE LATERAL RECTUS RESECTION RIGHT EYE ;  Surgeon: Gevena Cotton, MD;  Location: [facility];  Service: Ophthalmology;  Laterality: Right;  . CHOLECYSTECTOMY OPEN  1968  . EYE SURGERY  age 68   repair right eye injury  . GLAUCOMA SURGERY Right 08-23-2015  at [facility]  . INTUBATION-ENDOTRACHEAL WITH TRACHEOSTOMY STANDBY N/A 05/19/2020   Procedure: INTUBATION-ENDOTRACHEAL WITH TRACHEOSTOMY STANDBY;  Surgeon: Jerrell Belfast, MD;  Location: [place];  Service: ENT;  Laterality: N/A;  . MEDIAN RECTUS REPAIR Right  03/29/2016   Procedure: MEDIAN RECTUS RESECTION RIGHT EYE ADJUSTABLE SUTURE RIGHT EYE LATERAL RECTUS RESECTION RIGHT EYE ;  Surgeon: Gevena Cotton, MD;  Location: [facility];  Service: Ophthalmology;  Laterality: Right;     Social History   reports that she has been smoking cigarettes. She has a 2.50 pack-year smoking history. She has never used smokeless tobacco. She reports current alcohol use. She reports that she does not use drugs.   Family History   Her family history includes COPD in her brother; Emphysema in her father; Heart failure in her father and sister.   Allergies No Known Allergies   Home Medications  Prior to Admission medications   Medication Sig Start Date End Date Taking? Authorizing Provider  acetaminophen (TYLENOL) 500 MG tablet Take 500-1,000 mg by mouth every 6 (six) hours as needed (for pain).   Yes [provider]  ibuprofen (ADVIL) 200 MG tablet Take 400 mg by mouth every 6 (six) hours as needed (for pain).   Yes [provider]  mirtazapine (REMERON) 30 MG tablet TAKE 1 TABLET(30 MG) BY MOUTH AT BEDTIME Patient taking differently: Take 30 mg by mouth at bedtime as needed (for sleep).  01/27/20  Yes Kathrynn Ducking, MD  Multiple Vitamin (MULTIVITAMIN) tablet Take 1 tablet by mouth 3 (three) times a week.    Yes [provider]  rosuvastatin (CRESTOR) 10 MG tablet TAKE 1 TABLET(10 MG) BY MOUTH DAILY Patient taking differently: Take 10 mg by mouth every evening.  04/16/20  Yes Patwardhan, Manish J, MD  timolol (TIMOPTIC) 0.5 % ophthalmic solution Place 1 drop into both eyes 2 (two) times daily.  05/18/20  Yes [provider]  citalopram (CELEXA) 10 MG tablet  01/23/20   [provider]  lisinopril (ZESTRIL) 5 MG tablet Take 5 mg by mouth daily. Patient not taking: Reported on 05/19/2020    [provider]  metFORMIN (GLUCOPHAGE) 500  MG tablet Take 500 mg by mouth 2 (two) times daily. Patient not  taking: Reported on 05/19/2020 09/05/19   [provider]     Critical care time: 46mins    Eulis Foster, MD  [place], PGY-2  05/20/20

## 2020-05-20 NOTE — Progress Notes (Signed)
Unable to start D5-1/2NS due to medication incompatibilities. Attempted IV x3 then ordered IV team consult. Awaiting IV team.

## 2020-05-20 NOTE — Progress Notes (Signed)
[facility] Progress Note Patient Name: Bena Kobel DOB: June 04, 1941 MRN: [id number]   Date of Service  05/20/2020  HPI/Events of Note  Patient needs insulin and CBG monitoring transition orders, as well as order to continue Foley catheter.  eICU Interventions  Orders entered.        Kerry Kass Lahela Woodin 05/20/2020, 10:39 PM

## 2020-05-20 NOTE — Progress Notes (Signed)
Inpatient Diabetes Program Recommendations  AACE/ADA: New Consensus Statement on Inpatient Glycemic Control (2015)  Target Ranges:  Prepandial:   less than 140 mg/dL      Peak postprandial:   less than 180 mg/dL (1-2 hours)      Critically ill patients:  140 - 180 mg/dL   Lab Results  Component Value Date   GLUCAP 176 (H) 05/20/2020   HGBA1C 10.3 (H) 05/19/2020    Review of Glycemic Control  Diabetes history: type 2 Outpatient Diabetes medications: Metformin 500 mg BID Current orders for Inpatient glycemic control: IV insulin  Inpatient Diabetes Program Recommendations:   Received diabetes coordinator consult. Noted that patient is on IV insulin due to blood sugar >500 mg/dl on admission. Noted that patient takes Metformin at home.  Recommend Lantus 10 units daily and Novolog SENSITIVE correction scale every 4 hours while NPO when patient is ready to transition off IV insulin drip. Lantus to be given 2 hours prior to stopping IV insulin and then Novolog given when IV insulin is stopped. Titrate dosages as needed.   Will talk with patient about her diabetes at the appropriate time.  Harvel Ricks RN BSN CDE Diabetes Coordinator Pager: [phone]  8am-5pm

## 2020-05-21 ENCOUNTER — Encounter (HOSPITAL_COMMUNITY): Payer: Self-pay | Admitting: Otolaryngology

## 2020-05-21 DIAGNOSIS — J9601 Acute respiratory failure with hypoxia: Secondary | ICD-10-CM

## 2020-05-21 LAB — BASIC METABOLIC PANEL
Anion gap: 10 (ref 5–15)
Anion gap: 12 (ref 5–15)
BUN: 13 mg/dL (ref 8–23)
BUN: 14 mg/dL (ref 8–23)
CO2: 18 mmol/L — ABNORMAL LOW (ref 22–32)
CO2: 19 mmol/L — ABNORMAL LOW (ref 22–32)
Calcium: 8.5 mg/dL — ABNORMAL LOW (ref 8.9–10.3)
Calcium: 8.5 mg/dL — ABNORMAL LOW (ref 8.9–10.3)
Chloride: 111 mmol/L (ref 98–111)
Chloride: 111 mmol/L (ref 98–111)
Creatinine, Ser: 0.65 mg/dL (ref 0.44–1.00)
Creatinine, Ser: 0.66 mg/dL (ref 0.44–1.00)
GFR calc Af Amer: >60 mL/min (ref >60)
GFR calc Af Amer: >60 mL/min (ref >60)
GFR calc non Af Amer: >60 mL/min (ref >60)
GFR calc non Af Amer: >60 mL/min (ref >60)
Glucose, Bld: 163 mg/dL — ABNORMAL HIGH (ref 70–99)
Glucose, Bld: 205 mg/dL — ABNORMAL HIGH (ref 70–99)
Potassium: 3.6 mmol/L (ref 3.5–5.1)
Potassium: 4 mmol/L (ref 3.5–5.1)
Sodium: 140 mmol/L (ref 135–145)
Sodium: 141 mmol/L (ref 135–145)

## 2020-05-21 LAB — CBC WITH DIFFERENTIAL/PLATELET
Abs Immature Granulocytes: 0.47 10*3/uL — ABNORMAL HIGH (ref 0.00–0.07)
Basophils Absolute: 0.1 10*3/uL (ref 0.0–0.1)
Basophils Relative: 1 %
Eosinophils Absolute: 0 10*3/uL (ref 0.0–0.5)
Eosinophils Relative: 0 %
HCT: 27.1 % — ABNORMAL LOW (ref 36.0–46.0)
Hemoglobin: 8.8 g/dL — ABNORMAL LOW (ref 12.0–15.0)
Immature Granulocytes: 2 %
Lymphocytes Relative: 11 %
Lymphs Abs: 2.7 10*3/uL (ref 0.7–4.0)
MCH: 29 pg (ref 26.0–34.0)
MCHC: 32.5 g/dL (ref 30.0–36.0)
MCV: 89.4 fL (ref 80.0–100.0)
Monocytes Absolute: 2.2 10*3/uL — ABNORMAL HIGH (ref 0.1–1.0)
Monocytes Relative: 9 %
Neutro Abs: 18.5 10*3/uL — ABNORMAL HIGH (ref 1.7–7.7)
Neutrophils Relative %: 77 %
Platelets: 210 10*3/uL (ref 150–400)
RBC: 3.03 MIL/uL — ABNORMAL LOW (ref 3.87–5.11)
RDW: 14 % (ref 11.5–15.5)
WBC: 24 10*3/uL — ABNORMAL HIGH (ref 4.0–10.5)
nRBC: 0 % (ref 0.0–0.2)

## 2020-05-21 LAB — PHOSPHORUS
Phosphorus: 1 mg/dL — CL (ref 2.5–4.6)
Phosphorus: <1 mg/dL — CL (ref 2.5–4.6)

## 2020-05-21 LAB — MAGNESIUM
Magnesium: 1.1 mg/dL — ABNORMAL LOW (ref 1.7–2.4)
Magnesium: 1.1 mg/dL — ABNORMAL LOW (ref 1.7–2.4)

## 2020-05-21 LAB — BETA-HYDROXYBUTYRIC ACID
Beta-Hydroxybutyric Acid: 0.15 mmol/L (ref 0.05–0.27)
Beta-Hydroxybutyric Acid: 0.05 mmol/L (ref 0.05–0.27)

## 2020-05-21 LAB — GLUCOSE, CAPILLARY
Glucose-Capillary: 131 mg/dL — ABNORMAL HIGH (ref 70–99)
Glucose-Capillary: 162 mg/dL — ABNORMAL HIGH (ref 70–99)
Glucose-Capillary: 166 mg/dL — ABNORMAL HIGH (ref 70–99)
Glucose-Capillary: 173 mg/dL — ABNORMAL HIGH (ref 70–99)
Glucose-Capillary: 187 mg/dL — ABNORMAL HIGH (ref 70–99)
Glucose-Capillary: 187 mg/dL — ABNORMAL HIGH (ref 70–99)

## 2020-05-21 MED ORDER — K PHOS MONO-SOD PHOS DI & MONO 155-852-130 MG PO TABS
500.0000 mg | ORAL_TABLET | Freq: Two times a day (BID) | ORAL | Status: AC
  Administered 2020-05-21 – 2020-05-23 (×4): 500 mg
  Filled 2020-05-21 (×5): qty 2

## 2020-05-21 MED ORDER — FENTANYL 2500MCG IN NS 250ML (10MCG/ML) PREMIX INFUSION
0.0000 ug/h | INTRAVENOUS | Status: DC
  Administered 2020-05-21 (×2): 400 ug/h via INTRAVENOUS
  Administered 2020-05-21: 75 ug/h via INTRAVENOUS
  Administered 2020-05-22: 350 ug/h via INTRAVENOUS
  Administered 2020-05-22: 200 ug/h via INTRAVENOUS
  Administered 2020-05-22: 300 ug/h via INTRAVENOUS
  Administered 2020-05-23: 400 ug/h via INTRAVENOUS
  Administered 2020-05-23: 350 ug/h via INTRAVENOUS
  Administered 2020-05-23: 375 ug/h via INTRAVENOUS
  Administered 2020-05-24: 250 ug/h via INTRAVENOUS
  Administered 2020-05-24: 100 ug/h via INTRAVENOUS
  Administered 2020-05-24: 400 ug/h via INTRAVENOUS
  Administered 2020-05-25: 250 ug/h via INTRAVENOUS
  Filled 2020-05-21 (×13): qty 250

## 2020-05-21 MED ORDER — MAGNESIUM SULFATE 2 GM/50ML IV SOLN
2.0000 g | Freq: Every day | INTRAVENOUS | Status: AC
  Administered 2020-05-21 – 2020-05-22 (×2): 2 g via INTRAVENOUS
  Filled 2020-05-21 (×2): qty 50

## 2020-05-21 MED ORDER — LACTATED RINGERS IV BOLUS
500.0000 mL | Freq: Once | INTRAVENOUS | Status: AC
  Administered 2020-05-21: 500 mL via INTRAVENOUS

## 2020-05-21 MED ORDER — OXYCODONE HCL 5 MG PO TABS
5.0000 mg | ORAL_TABLET | Freq: Four times a day (QID) | ORAL | Status: DC | PRN
  Administered 2020-05-24 – 2020-05-30 (×6): 5 mg
  Filled 2020-05-21 (×6): qty 1

## 2020-05-21 MED ORDER — OSMOLITE 1.2 CAL PO LIQD
1000.0000 mL | ORAL | Status: DC
  Administered 2020-05-21 – 2020-06-01 (×13): 1000 mL
  Filled 2020-05-21 (×18): qty 1000

## 2020-05-21 MED ORDER — POTASSIUM CHLORIDE 10 MEQ/100ML IV SOLN
10.0000 meq | INTRAVENOUS | Status: AC
  Administered 2020-05-21 (×4): 10 meq via INTRAVENOUS
  Filled 2020-05-21 (×4): qty 100

## 2020-05-21 NOTE — Progress Notes (Signed)
NAME:  Katherine [name], MRN:  [id number], DOB:  April 16, 1941, LOS: 2 ADMISSION DATE:  05/19/2020, CONSULTATION DATE:  05/20/20 REFERRING MD:  Dr. Ronnald Nian, CHIEF COMPLAINT: facial swelling and pain   Brief History   79 year old female, past medical history hypertension hyperlipidemia, type 2 diabetes not taking any medications for this at this time.  Underwent recent tooth extraction,  now presents with lower facial pain concerning for ludwigs angina followed by hypotension and airway compromise, ventilated.   History of present illness   79 year old F with hx of HTN, HLD, T2DM, who recently underwent tooth extraction and presented with symptoms consistent with ludwig's angina. Patient was transferred to MICU for airway compromise and severe hypotension, started on phenylephrine and ventilated.   Past Medical History   has a past medical history of Essential tremor (09/25/2019), Exotropia of right eye, Glaucoma, Hyperlipidemia, Hypertension, Incomplete right bundle branch block, LAFB (left anterior fascicular block), SUI (stress urinary incontinence, female), Type 2 diabetes mellitus ([place]), Wears dentures, and Wears glasses.  Significant Hospital Events   7/7: ETT   Consults:  CCM  ENT Anesthesia   Procedures:  Endotracheal intubation   Significant Diagnostic Tests:  CT Soft Tissue and Neck: 1. Trans-spatial gas and fluid containing abscess of the floor of mouth, measuring 4.5 x 4.7 cm. 2. Small subperiosteal abscess along the internal surface of the right mandibular body, near an empty posterior molar socket.  Micro Data:  Blood Cultures No Growth <12 hours   Antimicrobials:  Unasyn 1.5g 7/7>>   Interim history/subjective:  Patient s/p I&D, febrile overnight to 101.5 normal temperatures since 12p 7/8. Insulin gtt discontinued and patient transitioned to SQ Levemir with standard sliding scale.   Objective   Blood pressure 111/63, pulse 62, temperature 99.2 F (37.3 C),  temperature source Oral, resp. rate 16, height 5\' 6"  (1.676 m), weight 60.6 kg, SpO2 100 %.    Vent Mode: PRVC FiO2 (%):  [30 %-40 %] 30 % Set Rate:  [16 bmp] 16 bmp Vt Set:  [470 mL] 470 mL PEEP:  [5 cmH20] 5 cmH20 Pressure Support:  [5 cmH20] 5 cmH20 Plateau Pressure:  [11 cmH20-24 cmH20] 17 cmH20   Intake/Output Summary (Last 24 hours) at 05/21/2020 0748 Last data filed at 05/21/2020 0600 Gross per 24 hour  Intake 3701.07 ml  Output 865 ml  Net 2836.07 ml   Filed Weights   05/19/20 1614 05/20/20 0457 05/21/20 0452  Weight: 52.2 kg 56 kg 60.6 kg    Examination: General: female appearing stated age sitting up in bed, slightly uncomfortable appearing but more alert than yesterday's exam  HENT: MMM, ETT, surgical site with wound dressing and tube draining brown fluid, still some induration on the mandibular edges but decreased in size from yesterday  Lungs: no rhonchi, wheezing or crackles, on PS ventilation with ETT  Cardiovascular: RRR  Abdomen: soft, NT , bowel sounds present  Extremities: no LE edema  Neuro: alert, follows commands   Resolved Hospital Problem list     Assessment & Plan:   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  ETT on ventilator, plan to wean with improvement in airway edema  Continue Unasyn 1.5g q6 hrs day of 2/14 Blood cultures negative @ <12 hours  Pt is s/p I&D of abscess   T2DM CBG ranged in 160s overnight. Insulin gtt discontinued overnight.    CBG monitoring  Levemir 15 units  Standard SSI Continue to hold home metformin 500mg    HLD: will hold home  Crestor 10mg    Hx of HTN  Newly developed hypotension  Blood pressures improved overnight ranging from 77-147/41-83. -will continue to wean and eventually dc vasopressors  -will hold home lisinopril given low pressures  Nutrition: NPO for now, consider starting tube feeds depending on duration expected to require ventilatory support  - SLP evaluation when  airway edema decreases   Best practice:  Diet: NPO Pain/Anxiety/Delirium protocol (if indicated): on Precedex  VAP protocol (if indicated): per protocol  DVT prophylaxis: Heparin  GI prophylaxis: Protonix  Glucose control: CBG monitoring and subcutaneous insulin  Mobility: bedrest  Code Status: Full code  Family Communication: spouse updated via telephone 7/9   Disposition: ICU until patient no longer requires intubation   Labs   CBC: Recent Labs  Lab 05/19/20 1629 05/19/20 1640 05/19/20 1706 05/20/20 0312 05/21/20 0322  WBC 20.1*  --   --  17.8* 24.0*  NEUTROABS 14.5*  --   --   --  18.5*  HGB 10.0* 10.9* 11.6* 8.1* 8.8*  HCT 31.7* 32.0* 34.0* 26.7* 27.1*  MCV 89.3  --   --  93.4 89.4  PLT 238  --   --  174 191    Basic Metabolic Panel: Recent Labs  Lab 05/19/20 2212 05/20/20 0312 05/20/20 1128 05/20/20 1612 05/20/20 2004 05/21/20 0003 05/21/20 0322  NA  --    < > 141 140 140 141 140  K  --    < > 3.8 3.4* 3.5 4.0 3.6  CL  --    < > 110 111 110 111 111  CO2  --    < > 21* 21* 21* 18* 19*  GLUCOSE  --    < > 172* 232* 257* 205* 163*  BUN  --    < > 17 15 15 14 13   CREATININE  --    < > 0.72 0.71 0.67 0.66 0.65  CALCIUM  --    < > 8.4* 8.3* 8.2* 8.5* 8.5*  MG 1.8  --   --   --   --   --   --   PHOS 1.4*  --   --   --   --   --   --    < > = values in this interval not displayed.   GFR: Estimated Creatinine Clearance: 55.1 mL/min (by C-G formula based on SCr of 0.65 mg/dL). Recent Labs  Lab 05/19/20 1629 05/19/20 1630 05/19/20 2212 05/20/20 0312 05/20/20 1711 05/21/20 0322  WBC 20.1*  --   --  17.8*  --  24.0*  LATICACIDVEN  --  1.8 2.9*  --  2.1*  --     Liver Function Tests: Recent Labs  Lab 05/19/20 1629  AST 23  ALT 26  ALKPHOS 148*  BILITOT 1.5*  PROT 6.8  ALBUMIN 3.0*   No results for input(s): LIPASE, AMYLASE in the last 168 hours. No results for input(s): AMMONIA in the last 168 hours.  ABG    Component Value Date/Time   HCO3  23.6 05/19/2020 1706   TCO2 25 05/19/2020 1706   O2SAT 86.0 05/19/2020 1706     Coagulation Profile: No results for input(s): INR, PROTIME in the last 168 hours.  Cardiac Enzymes: No results for input(s): CKTOTAL, CKMB, CKMBINDEX, TROPONINI in the last 168 hours.  HbA1C: Hgb A1c MFr Bld  Date/Time Value Ref Range Status  05/19/2020 10:12 PM 10.3 (H) 4.8 - 5.6 % Final    Comment:    (NOTE) Pre diabetes:  5.7%-6.4%  Diabetes:              >6.4%  Glycemic control for   <7.0% adults with diabetes   06/25/2012 09:59 AM 6.5 (H) <5.7 % Final    Comment:    (NOTE)                                                                       According to the ADA Clinical Practice Recommendations for 2011, when HbA1c is used as a screening test:  >=6.5%   Diagnostic of Diabetes Mellitus           (if abnormal result is confirmed) 5.7-6.4%   Increased risk of developing Diabetes Mellitus References:Diagnosis and Classification of Diabetes Mellitus,Diabetes CBSW,9675,91(MBWGY 1):S62-S69 and Standards of Medical Care in         Diabetes - 2011,Diabetes KZLD,3570,17 (Suppl 1):S11-S61.    CBG: Recent Labs  Lab 05/20/20 2128 05/20/20 2224 05/20/20 2327 05/21/20 0323 05/21/20 0710  GLUCAP 159* 175* 168* 162* 166*    Past Medical History  She,  has a past medical history of Essential tremor (09/25/2019), Exotropia of right eye, Glaucoma, Hyperlipidemia, Hypertension, Incomplete right bundle branch block, LAFB (left anterior fascicular block), SUI (stress urinary incontinence, female), Type 2 diabetes mellitus ([place]), Wears dentures, and Wears glasses.   Surgical History    Past Surgical History:  Procedure Laterality Date  . ADJUSTABLE SUTURE MANIPULATION Right 03/29/2016   Procedure: ADJUSTABLE SUTURE RIGHT EYE LATERAL RECTUS RESECTION RIGHT EYE ;  Surgeon: Gevena Cotton, MD;  Location: [facility];  Service: Ophthalmology;  Laterality: Right;  .  CHOLECYSTECTOMY OPEN  1968  . EYE SURGERY  age 55   repair right eye injury  . GLAUCOMA SURGERY Right 08-23-2015  at [facility]  . INTUBATION-ENDOTRACHEAL WITH TRACHEOSTOMY STANDBY N/A 05/19/2020   Procedure: INTUBATION-ENDOTRACHEAL WITH TRACHEOSTOMY STANDBY;  Surgeon: Jerrell Belfast, MD;  Location: [place];  Service: ENT;  Laterality: N/A;  . MEDIAN RECTUS REPAIR Right 03/29/2016   Procedure: MEDIAN RECTUS RESECTION RIGHT EYE ADJUSTABLE SUTURE RIGHT EYE LATERAL RECTUS RESECTION RIGHT EYE ;  Surgeon: Gevena Cotton, MD;  Location: [facility];  Service: Ophthalmology;  Laterality: Right;     Social History   reports that she has been smoking cigarettes. She has a 2.50 pack-year smoking history. She has never used smokeless tobacco. She reports current alcohol use. She reports that she does not use drugs.   Family History   Her family history includes COPD in her brother; Emphysema in her father; Heart failure in her father and sister.   Allergies No Known Allergies   Home Medications  Prior to Admission medications   Medication Sig Start Date End Date Taking? Authorizing Provider  acetaminophen (TYLENOL) 500 MG tablet Take 500-1,000 mg by mouth every 6 (six) hours as needed (for pain).   Yes [provider]  ibuprofen (ADVIL) 200 MG tablet Take 400 mg by mouth every 6 (six) hours as needed (for pain).   Yes [provider]  mirtazapine (REMERON) 30 MG tablet TAKE 1 TABLET(30 MG) BY MOUTH AT BEDTIME Patient taking differently: Take 30 mg by mouth at bedtime as needed (for sleep).  01/27/20  Yes Kathrynn Ducking, MD  Multiple Vitamin (  MULTIVITAMIN) tablet Take 1 tablet by mouth 3 (three) times a week.    Yes [provider]  rosuvastatin (CRESTOR) 10 MG tablet TAKE 1 TABLET(10 MG) BY MOUTH DAILY Patient taking differently: Take 10 mg by mouth every evening.  04/16/20  Yes Patwardhan, Manish J, MD  timolol (TIMOPTIC) 0.5 % ophthalmic solution Place 1 drop into  both eyes 2 (two) times daily.  05/18/20  Yes [provider]  citalopram (CELEXA) 10 MG tablet  01/23/20   [provider]  lisinopril (ZESTRIL) 5 MG tablet Take 5 mg by mouth daily. Patient not taking: Reported on 05/19/2020    [provider]  metFORMIN (GLUCOPHAGE) 500 MG tablet Take 500 mg by mouth 2 (two) times daily. Patient not taking: Reported on 05/19/2020 09/05/19   [provider]     Critical care time: 27mins    Eulis Foster, MD  [place], PGY-2  05/21/20

## 2020-05-21 NOTE — Progress Notes (Signed)
Notified [facility] of decreased UOP. Also notified them that patient is intermittently anxious despite 433mcg Fentanyl infusion and precedex up titrations.

## 2020-05-21 NOTE — Progress Notes (Signed)
Katherine [name] Patient Name: Katherine [name] DOB: [birth year] MRN: [id number]   Date of Service  05/21/2020  HPI/Events of [name]  Patient with sub-optimal sedation on current infusion rate of Precedex, the hope is to extubate patient in a.m.  eICU Interventions  Precedex infusion ceiling increased to 1.8 mcg.        Kerry Kass Katherine [name] 05/21/2020, 2:26 AM

## 2020-05-21 NOTE — Progress Notes (Signed)
Patient had pulled tube slightly out. RT advanced tube to 24 cm at lip. RN at bedside. RT will continue to monitor.

## 2020-05-21 NOTE — Progress Notes (Signed)
Initial Nutrition Assessment  DOCUMENTATION CODES:   Not applicable  INTERVENTION:  Tube Feeding:  Osmolite 1.2 at 60 ml/hr Provides 80 g of protein, 1728 kcals and 1166 mL of free water Meets 100% estimated calorie and protein needs  NUTRITION DIAGNOSIS:   Inadequate oral intake related to acute illness, inability to eat as evidenced by NPO status.   GOAL:   Patient will meet greater than or equal to 90% of their needs   MONITOR:   Vent status, Labs, Weight trends, TF tolerance, Diet advancement  REASON FOR ASSESSMENT:   Ventilator    ASSESSMENT:   79 yo female admitted with acute respiratory failure requiring intubation due to upper airway obstruction from submandibular abscess. PMH includes HTN, HLD, DM, recent tooth extraction   RD working remotely.  7/07 Intubated 7/08 I&D neck abscess  Patient is currently intubated on ventilator support, no pressors, precedex for sedation MV: 10.6 L/min Temp (24hrs), Avg:98.2 F (36.8 C), Min:96.7 F (35.9 C), Max:99.7 F (37.6 C)  Propofol: NONE  No OG or NG tube in place. Plan for Cortrak today  Current weight 60.6 kg; admit weight 52.2 kg  UOP <1 L; net positive 7 L. No edema present  Labs: reviewed Meds: miralax, ss novolog, levemir, D5-1.2 NS at 50 ml/hr  Diet Order:   Diet Order            Diet NPO time specified  Diet effective now                 EDUCATION NEEDS:   Not appropriate for education at this time  Skin:  Skin Assessment: Reviewed RN Assessment  Last BM:  no documented BM  Height:   Ht Readings from Last 1 Encounters:  05/19/20 5\' 6"  (1.676 m)    Weight:   Wt Readings from Last 1 Encounters:  05/21/20 60.6 kg    BMI:  Body mass index is 21.56 kg/m.  Estimated Nutritional Needs:   Kcal:  1525-1830 (kcals/kg), 1630 (PSU)  Protein:  75-90 g  Fluid:  >/= 1.5 L    Kerman Passey MS, RDN, LDN, CNSC Registered Dietitian III Clinical Nutrition RD Pager and On-Call  Pager Number Located in [place]

## 2020-05-21 NOTE — Procedures (Signed)
Cortrak  Person Inserting Tube:  King, Sharda Keddy E, RD Tube Type:  Cortrak - 43 inches Tube Location:  Left nare Initial Placement:  Stomach Secured by: Bridle Technique Used to Measure Tube Placement:  Documented cm marking at nare/ corner of mouth Cortrak Secured At:  68 cm    Cortrak Tube Team Note:  Consult received to place a Cortrak feeding tube.   No x-ray is required. RN may begin using tube.   If the tube becomes dislodged please keep the tube and contact the Cortrak team at [email or web address] (password [email or web address]) for replacement.  If after hours and replacement cannot be delayed, place a NG tube and confirm placement with an abdominal x-ray.   Brandye Inthavong King, MS, RD, LDN Pager number available on Amion 

## 2020-05-21 NOTE — Progress Notes (Signed)
[facility] Progress Note Patient Name: Katherine [name] DOB: Aug 11, 1941 MRN: [id number]   Date of Service  05/21/2020  HPI/Events of Note  Patient is now on enteral nutrition and RN was asking if D5 1/2 NS  Should  Be discontinued, patient has had poor urine output.  eICU Interventions  D 5 1/2 NS discontinued, Lactated Ringers 500 ml iv fluid bolus x 1.        Kerry Kass Blaire Palomino 05/21/2020, 10:22 PM

## 2020-05-22 LAB — COMPREHENSIVE METABOLIC PANEL
ALT: 35 U/L (ref 0–44)
AST: 37 U/L (ref 15–41)
Albumin: 2 g/dL — ABNORMAL LOW (ref 3.5–5.0)
Alkaline Phosphatase: 139 U/L — ABNORMAL HIGH (ref 38–126)
Anion gap: 8 (ref 5–15)
BUN: 15 mg/dL (ref 8–23)
CO2: 22 mmol/L (ref 22–32)
Calcium: 8.5 mg/dL — ABNORMAL LOW (ref 8.9–10.3)
Chloride: 111 mmol/L (ref 98–111)
Creatinine, Ser: 0.72 mg/dL (ref 0.44–1.00)
GFR calc Af Amer: >60 mL/min (ref >60)
GFR calc non Af Amer: >60 mL/min (ref >60)
Glucose, Bld: 243 mg/dL — ABNORMAL HIGH (ref 70–99)
Potassium: 3.5 mmol/L (ref 3.5–5.1)
Sodium: 141 mmol/L (ref 135–145)
Total Bilirubin: 0.7 mg/dL (ref 0.3–1.2)
Total Protein: 5.4 g/dL — ABNORMAL LOW (ref 6.5–8.1)

## 2020-05-22 LAB — CBC WITH DIFFERENTIAL/PLATELET
Abs Immature Granulocytes: 0.62 10*3/uL — ABNORMAL HIGH (ref 0.00–0.07)
Basophils Absolute: 0 10*3/uL (ref 0.0–0.1)
Basophils Relative: 0 %
Eosinophils Absolute: 0.1 10*3/uL (ref 0.0–0.5)
Eosinophils Relative: 0 %
HCT: 23.3 % — ABNORMAL LOW (ref 36.0–46.0)
Hemoglobin: 7.2 g/dL — ABNORMAL LOW (ref 12.0–15.0)
Immature Granulocytes: 3 %
Lymphocytes Relative: 15 %
Lymphs Abs: 3.5 10*3/uL (ref 0.7–4.0)
MCH: 28.5 pg (ref 26.0–34.0)
MCHC: 30.9 g/dL (ref 30.0–36.0)
MCV: 92.1 fL (ref 80.0–100.0)
Monocytes Absolute: 1.2 10*3/uL — ABNORMAL HIGH (ref 0.1–1.0)
Monocytes Relative: 5 %
Neutro Abs: 17.6 10*3/uL — ABNORMAL HIGH (ref 1.7–7.7)
Neutrophils Relative %: 77 %
Platelets: 305 10*3/uL (ref 150–400)
RBC: 2.53 MIL/uL — ABNORMAL LOW (ref 3.87–5.11)
RDW: 14.6 % (ref 11.5–15.5)
WBC: 23 10*3/uL — ABNORMAL HIGH (ref 4.0–10.5)
nRBC: 0 % (ref 0.0–0.2)

## 2020-05-22 LAB — MAGNESIUM
Magnesium: 1.7 mg/dL (ref 1.7–2.4)
Magnesium: 2.2 mg/dL (ref 1.7–2.4)

## 2020-05-22 LAB — PHOSPHORUS
Phosphorus: 2.6 mg/dL (ref 2.5–4.6)
Phosphorus: 2.8 mg/dL (ref 2.5–4.6)

## 2020-05-22 LAB — GLUCOSE, CAPILLARY
Glucose-Capillary: 208 mg/dL — ABNORMAL HIGH (ref 70–99)
Glucose-Capillary: 200 mg/dL — ABNORMAL HIGH (ref 70–99)
Glucose-Capillary: 210 mg/dL — ABNORMAL HIGH (ref 70–99)
Glucose-Capillary: 302 mg/dL — ABNORMAL HIGH (ref 70–99)
Glucose-Capillary: 275 mg/dL — ABNORMAL HIGH (ref 70–99)
Glucose-Capillary: 262 mg/dL — ABNORMAL HIGH (ref 70–99)
Glucose-Capillary: 293 mg/dL — ABNORMAL HIGH (ref 70–99)

## 2020-05-22 MED ORDER — NOREPINEPHRINE 4 MG/250ML-% IV SOLN
0.0000 ug/min | INTRAVENOUS | Status: DC
  Administered 2020-05-22: 2 ug/min via INTRAVENOUS
  Administered 2020-05-23: 12 ug/min via INTRAVENOUS
  Administered 2020-05-23: 10 ug/min via INTRAVENOUS
  Administered 2020-05-23 – 2020-05-24 (×2): 7 ug/min via INTRAVENOUS
  Administered 2020-05-25: 6 ug/min via INTRAVENOUS
  Filled 2020-05-22 (×7): qty 250

## 2020-05-22 MED ORDER — INSULIN DETEMIR 100 UNIT/ML ~~LOC~~ SOLN
20.0000 [IU] | Freq: Two times a day (BID) | SUBCUTANEOUS | Status: DC
  Filled 2020-05-22: qty 0.2

## 2020-05-22 MED ORDER — INSULIN ASPART 100 UNIT/ML ~~LOC~~ SOLN
0.0000 [IU] | SUBCUTANEOUS | Status: DC
  Administered 2020-05-22: 11 [IU] via SUBCUTANEOUS
  Administered 2020-05-23: 4 [IU] via SUBCUTANEOUS
  Administered 2020-05-23 (×3): 7 [IU] via SUBCUTANEOUS
  Administered 2020-05-23 – 2020-05-24 (×2): 11 [IU] via SUBCUTANEOUS
  Administered 2020-05-24 (×2): 7 [IU] via SUBCUTANEOUS
  Administered 2020-05-24: 11 [IU] via SUBCUTANEOUS
  Administered 2020-05-24 – 2020-05-25 (×2): 4 [IU] via SUBCUTANEOUS
  Administered 2020-05-25 (×3): 7 [IU] via SUBCUTANEOUS
  Administered 2020-05-25: 3 [IU] via SUBCUTANEOUS
  Administered 2020-05-26: 4 [IU] via SUBCUTANEOUS
  Administered 2020-05-26 (×2): 3 [IU] via SUBCUTANEOUS
  Administered 2020-05-26: 4 [IU] via SUBCUTANEOUS
  Administered 2020-05-26 – 2020-05-27 (×2): 7 [IU] via SUBCUTANEOUS
  Administered 2020-05-27 (×2): 3 [IU] via SUBCUTANEOUS
  Administered 2020-05-27: 4 [IU] via SUBCUTANEOUS
  Administered 2020-05-27: 11 [IU] via SUBCUTANEOUS
  Administered 2020-05-28 (×2): 7 [IU] via SUBCUTANEOUS
  Administered 2020-05-28: 4 [IU] via SUBCUTANEOUS
  Administered 2020-05-28: 11 [IU] via SUBCUTANEOUS
  Administered 2020-05-28: 4 [IU] via SUBCUTANEOUS
  Administered 2020-05-28 – 2020-05-29 (×2): 11 [IU] via SUBCUTANEOUS
  Administered 2020-05-29: 15 [IU] via SUBCUTANEOUS
  Administered 2020-05-29 (×2): 11 [IU] via SUBCUTANEOUS
  Administered 2020-05-29: 7 [IU] via SUBCUTANEOUS
  Administered 2020-05-29: 11 [IU] via SUBCUTANEOUS
  Administered 2020-05-30: 7 [IU] via SUBCUTANEOUS
  Administered 2020-05-30: 4 [IU] via SUBCUTANEOUS
  Administered 2020-05-30: 11 [IU] via SUBCUTANEOUS
  Administered 2020-05-30 – 2020-05-31 (×3): 4 [IU] via SUBCUTANEOUS
  Administered 2020-05-31 (×3): 7 [IU] via SUBCUTANEOUS
  Administered 2020-05-31: 4 [IU] via SUBCUTANEOUS
  Administered 2020-06-01: 11 [IU] via SUBCUTANEOUS
  Administered 2020-06-01 (×2): 4 [IU] via SUBCUTANEOUS
  Administered 2020-06-01: 11 [IU] via SUBCUTANEOUS

## 2020-05-22 MED ORDER — DEXMEDETOMIDINE HCL IN NACL 400 MCG/100ML IV SOLN
0.0000 ug/kg/h | INTRAVENOUS | Status: AC
  Administered 2020-05-22: 0.6 ug/kg/h via INTRAVENOUS
  Administered 2020-05-23 (×3): 0.7 ug/kg/h via INTRAVENOUS
  Administered 2020-05-24: 0.6 ug/kg/h via INTRAVENOUS
  Administered 2020-05-24: 0.7 ug/kg/h via INTRAVENOUS
  Administered 2020-05-25 (×2): 0.6 ug/kg/h via INTRAVENOUS
  Filled 2020-05-22 (×8): qty 100

## 2020-05-22 MED ORDER — INSULIN ASPART 100 UNIT/ML ~~LOC~~ SOLN
4.0000 [IU] | SUBCUTANEOUS | Status: DC
  Administered 2020-05-22 – 2020-05-23 (×4): 4 [IU] via SUBCUTANEOUS

## 2020-05-22 MED ORDER — INSULIN DETEMIR 100 UNIT/ML ~~LOC~~ SOLN
20.0000 [IU] | Freq: Every day | SUBCUTANEOUS | Status: DC
  Administered 2020-05-22: 20 [IU] via SUBCUTANEOUS
  Filled 2020-05-22 (×2): qty 0.2

## 2020-05-22 MED ORDER — SODIUM CHLORIDE 0.9 % IV SOLN
2.0000 g | INTRAVENOUS | Status: AC
  Administered 2020-05-22 – 2020-06-05 (×15): 2 g via INTRAVENOUS
  Filled 2020-05-22 (×3): qty 20
  Filled 2020-05-22: qty 2
  Filled 2020-05-22: qty 20
  Filled 2020-05-22: qty 2
  Filled 2020-05-22: qty 20
  Filled 2020-05-22: qty 2
  Filled 2020-05-22 (×4): qty 20
  Filled 2020-05-22: qty 2
  Filled 2020-05-22: qty 20
  Filled 2020-05-22: qty 2

## 2020-05-22 MED ORDER — INSULIN DETEMIR 100 UNIT/ML ~~LOC~~ SOLN
20.0000 [IU] | Freq: Two times a day (BID) | SUBCUTANEOUS | Status: DC
  Administered 2020-05-22: 20 [IU] via SUBCUTANEOUS
  Filled 2020-05-22 (×2): qty 0.2

## 2020-05-22 MED ORDER — POLYETHYLENE GLYCOL 3350 17 G PO PACK: 17.0000 g | PACK | Freq: Every day | ORAL | Status: DC | PRN

## 2020-05-22 MED ORDER — INSULIN DETEMIR 100 UNIT/ML ~~LOC~~ SOLN
15.0000 [IU] | Freq: Once | SUBCUTANEOUS | Status: AC
  Administered 2020-05-22: 15 [IU] via SUBCUTANEOUS
  Filled 2020-05-22: qty 0.15

## 2020-05-22 NOTE — Plan of Care (Signed)

## 2020-05-22 NOTE — Progress Notes (Signed)
° °  ENT Progress Note: POD #2 s/p Procedure(s): INCISION AND DRAINAGE OF NECK ABSCESS   Subjective: Intubated and sedated  Objective: Vital signs in last 24 hours: Temp:  [97.1 F (36.2 C)-98.6 F (37 C)] 98.2 F (36.8 C) (07/10 0321) Pulse Rate:  [53-85] 62 (07/10 0700) Resp:  [16-29] 16 (07/10 0700) BP: (84-158)/(37-110) 94/39 (07/10 0700) SpO2:  [100 %] 100 % (07/10 0727) FiO2 (%):  [30 %] 30 % (07/10 0727) Weight:  [66.1 kg] 66.1 kg (07/10 0414) Weight change: 5.5 kg Last BM Date:  (PTA)  Intake/Output from previous day: 07/09 0701 - 07/10 0700 In: 3684.5 [I.V.:1976.4; NG/GT:410; IV Piggyback:1298.1] Out: 1275 [Urine:1275] Intake/Output this shift: No intake/output data recorded.  Labs: Recent Labs    05/20/20 0312 05/21/20 0322  WBC 17.8* 24.0*  HGB 8.1* 8.8*  HCT 26.7* 27.1*  PLT 174 210   Recent Labs    05/21/20 0003 05/21/20 0322  NA 141 140  K 4.0 3.6  CL 111 111  CO2 18* 19*  GLUCOSE 205* 163*  BUN 14 13  CALCIUM 8.5* 8.5*    Studies/Results: No results found.   PHYSICAL EXAM: JP inplace with mod d/c Decreased swelling   Assessment/Plan: Pt stable and improving  Cont current medical trx Plan gradual drain removal next week    Jerrell Belfast 05/22/2020, 8:01 AM

## 2020-05-22 NOTE — Progress Notes (Signed)
NAME:  Katherine [name], MRN:  [id number], DOB:  05-12-1941, LOS: 3 ADMISSION DATE:  05/19/2020, CONSULTATION DATE:  05/20/20 REFERRING MD:  Dr. Ronnald Nian, CHIEF COMPLAINT: facial swelling and pain   Brief History   79 year old female, past medical history hypertension hyperlipidemia, type 2 diabetes not taking any medications for this at this time.  Underwent recent tooth extraction,  now presents with lower facial pain concerning for ludwigs angina followed by hypotension and airway compromise, ventilated.   History of present illness   79 year old F with hx of HTN, HLD, T2DM, who recently underwent tooth extraction and presented with symptoms consistent with ludwig's angina. Patient was transferred to MICU for airway compromise and severe hypotension, started on phenylephrine and ventilated.   Past Medical History   has a past medical history of Essential tremor (09/25/2019), Exotropia of right eye, Glaucoma, Hyperlipidemia, Hypertension, Incomplete right bundle branch block, LAFB (left anterior fascicular block), SUI (stress urinary incontinence, female), Type 2 diabetes mellitus ([place]), Wears dentures, and Wears glasses.  Significant Hospital Events   7/7: ETT antibiotic started culture sent 7/8 went to the operating room where she underwent incision and drainage of neck abscess, with Penrose drain placed cultures were sent 7/9: Pressors weaned off.  Supported on mechanical ventilation, extubation deferred due to upper airway swelling 7/10: Dressing changed.  Remarkable decrease in swelling but still with significant trismus raising concern for adequate airway protection   Consults:  CCM  ENT Anesthesia   Procedures:  Endotracheal intubation   Significant Diagnostic Tests:  CT Soft Tissue and Neck:1. Trans-spatial gas and fluid containing abscess of the floor of mouth, measuring 4.5 x 4.7 cm.2. Small subperiosteal abscess along the internal surface of the right mandibular body, near an  empty posterior molar socket.  Micro Data:  Blood Cultures No Growth 7/7>>> Surgical culture 7/8: few strep constellatus>>>  Antimicrobials:  Unasyn 1.5g 7/7>>   Interim history/subjective:  No issues overnight  Objective   Blood pressure (Abnormal) 94/39, pulse 62, temperature 98.2 F (36.8 C), temperature source Oral, resp. rate 16, height 5\' 6"  (1.676 m), weight 66.1 kg, SpO2 100 %.    Vent Mode: PRVC FiO2 (%):  [30 %] 30 % Set Rate:  [16 bmp] 16 bmp Vt Set:  [470 mL] 470 mL PEEP:  [5 cmH20] 5 cmH20 Pressure Support:  [5 cmH20] 5 cmH20 Plateau Pressure:  [18 cmH20-21 cmH20] 19 cmH20   Intake/Output Summary (Last 24 hours) at 05/22/2020 0745 Last data filed at 05/22/2020 0700 Gross per 24 hour  Intake 3684.48 ml  Output 1275 ml  Net 2409.48 ml   Filed Weights   05/20/20 0457 05/21/20 0452 05/22/20 0414  Weight: 56 kg 60.6 kg 66.1 kg    Examination:  General 79 year old black female currently awake, supported on mechanical ventilator no acute distress requiring intermittent as needed fentanyl HEENT Marked swelling over the submandibular space, dressing had been removed by ENT, purulent discharge saturating dressing, Penrose drains in place.  Still with significant trismus Pulmonary clear to auscultation currently FiO2 40%, PEEP 5, saturations 100% no accessory use on the settings, no swelling or crepitus noted over chest wall Cardiac regular rate and rhythm without murmur rub or gallop Abdomen soft not tender no organomegaly Extremities are warm and dry with brisk capillary refill Neuro awake, will follow commands.  Anxious at times.   GU: Foley catheter in place concentrated urine noted urine output diminished overnight but seemingly responsive to IV fluids   Resolved Hospital Problem list  Nonanion gap metabolic acidosis  Assessment & Plan:   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  Plan  day #3 of 14 antibiotic, currently on  Unasyn, awaiting final sensitivities Continue dressing changes as directed by ENT Continuing full mechanical ventilation, will need further decrease and submandibular edema and trismus to ensure adequate airway protection VAP bundle PAD protocol with RASS goal of -2 We will get a.m. chest x-ray  Oliguria, seemingly volume responsive. Plan Continue maintenance IV fluids initiating LR at 50 mL an hour We will get chemistry this morning  Fluid and electrolyte imbalance: Hypomagnesemia hypophosphatemia these were addressed and replaced on 7/9 Plan Repeat chemistry this a.m. replace as indicated  Hypotension: Drug-related.  Suspect this is secondary to Precedex and propofol although some residual volume depletion could be playing a role She does get bradycardic at times particularly with up titration of Precedex Plan Continue phenylephrine for mean arterial pressure greater than 65 Gentle IV hydration Holding home lisinopril  T2DM With hyperglycemia Plan Continue every 4 hour sliding scale insulin, increase Levemir to 20 units every 12 Continue to monitor blood glucose every 4 hours Continue to hold Metformin   HLD: Plan Resume Crestor via tube     Best practice:  Diet: NPO Pain/Anxiety/Delirium protocol (if indicated): on Precedex  VAP protocol (if indicated): per protocol  DVT prophylaxis: Heparin  GI prophylaxis: Protonix  Glucose control: CBG monitoring and subcutaneous insulin  Mobility: bedrest  Code Status: Full code  Family Communication: spouse updated via telephone 7/9   Disposition: ICU until patient no longer requires intubation   Labs   CBC: Recent Labs  Lab 05/19/20 1629 05/19/20 1640 05/19/20 1706 05/20/20 0312 05/21/20 0322  WBC 20.1*  --   --  17.8* 24.0*  NEUTROABS 14.5*  --   --   --  18.5*  HGB 10.0* 10.9* 11.6* 8.1* 8.8*  HCT 31.7* 32.0* 34.0* 26.7* 27.1*  MCV 89.3  --   --  93.4 89.4  PLT 238  --   --  174 993    Basic Metabolic  Panel: Recent Labs  Lab 05/19/20 2212 05/20/20 0312 05/20/20 1128 05/20/20 1612 05/20/20 2004 05/21/20 0003 05/21/20 0322 05/21/20 1412 05/21/20 1722  NA  --    < > 141 140 140 141 140  --   --   K  --    < > 3.8 3.4* 3.5 4.0 3.6  --   --   CL  --    < > 110 111 110 111 111  --   --   CO2  --    < > 21* 21* 21* 18* 19*  --   --   GLUCOSE  --    < > 172* 232* 257* 205* 163*  --   --   BUN  --    < > 17 15 15 14 13   --   --   CREATININE  --    < > 0.72 0.71 0.67 0.66 0.65  --   --   CALCIUM  --    < > 8.4* 8.3* 8.2* 8.5* 8.5*  --   --   MG 1.8  --   --   --   --   --   --  1.1* 1.1*  PHOS 1.4*  --   --   --   --   --   --  1.0* <1.0*   < > = values in this interval not displayed.   GFR:  Estimated Creatinine Clearance: 55.1 mL/min (by C-G formula based on SCr of 0.65 mg/dL). Recent Labs  Lab 05/19/20 1629 05/19/20 1630 05/19/20 2212 05/20/20 0312 05/20/20 1711 05/21/20 0322  WBC 20.1*  --   --  17.8*  --  24.0*  LATICACIDVEN  --  1.8 2.9*  --  2.1*  --     Liver Function Tests: Recent Labs  Lab 05/19/20 1629  AST 23  ALT 26  ALKPHOS 148*  BILITOT 1.5*  PROT 6.8  ALBUMIN 3.0*   No results for input(s): LIPASE, AMYLASE in the last 168 hours. No results for input(s): AMMONIA in the last 168 hours.  ABG    Component Value Date/Time   HCO3 23.6 05/19/2020 1706   TCO2 25 05/19/2020 1706   O2SAT 86.0 05/19/2020 1706     Coagulation Profile: No results for input(s): INR, PROTIME in the last 168 hours.  Cardiac Enzymes: No results for input(s): CKTOTAL, CKMB, CKMBINDEX, TROPONINI in the last 168 hours.  HbA1C: Hgb A1c MFr Bld  Date/Time Value Ref Range Status  05/19/2020 10:12 PM 10.3 (H) 4.8 - 5.6 % Final    Comment:    (NOTE) Pre diabetes:          5.7%-6.4%  Diabetes:              >6.4%  Glycemic control for   <7.0% adults with diabetes   06/25/2012 09:59 AM 6.5 (H) <5.7 % Final    Comment:    (NOTE)                                                                        According to the ADA Clinical Practice Recommendations for 2011, when HbA1c is used as a screening test:  >=6.5%   Diagnostic of Diabetes Mellitus           (if abnormal result is confirmed) 5.7-6.4%   Increased risk of developing Diabetes Mellitus References:Diagnosis and Classification of Diabetes Mellitus,Diabetes DJSH,7026,37(CHYIF 1):S62-S69 and Standards of Medical Care in         Diabetes - 2011,Diabetes Care,2011,34 (Suppl 1):S11-S61.    CBG: Recent Labs  Lab 05/21/20 1438 05/21/20 1939 05/21/20 2341 05/22/20 0319 05/22/20 0711  GLUCAP 131* 187* 187* 208* 200*     Critical care time:  34 minutes.

## 2020-05-22 NOTE — Progress Notes (Signed)
[facility] Progress Note Patient Name: Linnell Swords DOB: 1941/10/10 MRN: [id number]   Date of Service  05/22/2020  HPI/Events of Note  Notified of increasing glucose trnd. Levemir dose increased earlier today.  eICU Interventions  Will increased SSI to high dose for now     Intervention Category Intermediate Interventions: Hyperglycemia - evaluation and treatment  Shona Needles Kammy Klett 05/22/2020, 11:43 PM

## 2020-05-23 ENCOUNTER — Inpatient Hospital Stay: Payer: Self-pay

## 2020-05-23 ENCOUNTER — Inpatient Hospital Stay (HOSPITAL_COMMUNITY): Payer: Medicare Other

## 2020-05-23 DIAGNOSIS — J9621 Acute and chronic respiratory failure with hypoxia: Secondary | ICD-10-CM | POA: Diagnosis present

## 2020-05-23 LAB — GLUCOSE, CAPILLARY
Glucose-Capillary: 115 mg/dL — ABNORMAL HIGH (ref 70–99)
Glucose-Capillary: 121 mg/dL — ABNORMAL HIGH (ref 70–99)
Glucose-Capillary: 170 mg/dL — ABNORMAL HIGH (ref 70–99)
Glucose-Capillary: 207 mg/dL — ABNORMAL HIGH (ref 70–99)
Glucose-Capillary: 209 mg/dL — ABNORMAL HIGH (ref 70–99)
Glucose-Capillary: 210 mg/dL — ABNORMAL HIGH (ref 70–99)
Glucose-Capillary: 264 mg/dL — ABNORMAL HIGH (ref 70–99)
Glucose-Capillary: 99 mg/dL (ref 70–99)

## 2020-05-23 LAB — PHOSPHORUS: Phosphorus: 3.6 mg/dL (ref 2.5–4.6)

## 2020-05-23 LAB — CBC WITH DIFFERENTIAL/PLATELET
Abs Immature Granulocytes: 0.74 10*3/uL — ABNORMAL HIGH (ref 0.00–0.07)
Basophils Absolute: 0.1 10*3/uL (ref 0.0–0.1)
Basophils Relative: 0 %
Eosinophils Absolute: 0.1 10*3/uL (ref 0.0–0.5)
Eosinophils Relative: 0 %
HCT: 23.9 % — ABNORMAL LOW (ref 36.0–46.0)
Hemoglobin: 7.3 g/dL — ABNORMAL LOW (ref 12.0–15.0)
Immature Granulocytes: 3 %
Lymphocytes Relative: 14 %
Lymphs Abs: 3 10*3/uL (ref 0.7–4.0)
MCH: 28.7 pg (ref 26.0–34.0)
MCHC: 30.5 g/dL (ref 30.0–36.0)
MCV: 94.1 fL (ref 80.0–100.0)
Monocytes Absolute: 1.7 10*3/uL — ABNORMAL HIGH (ref 0.1–1.0)
Monocytes Relative: 8 %
Neutro Abs: 16 10*3/uL — ABNORMAL HIGH (ref 1.7–7.7)
Neutrophils Relative %: 75 %
Platelets: 362 10*3/uL (ref 150–400)
RBC: 2.54 MIL/uL — ABNORMAL LOW (ref 3.87–5.11)
RDW: 15 % (ref 11.5–15.5)
WBC: 21.6 10*3/uL — ABNORMAL HIGH (ref 4.0–10.5)
nRBC: 0 % (ref 0.0–0.2)

## 2020-05-23 LAB — BASIC METABOLIC PANEL
Anion gap: 9 (ref 5–15)
BUN: 16 mg/dL (ref 8–23)
CO2: 23 mmol/L (ref 22–32)
Calcium: 8.6 mg/dL — ABNORMAL LOW (ref 8.9–10.3)
Chloride: 113 mmol/L — ABNORMAL HIGH (ref 98–111)
Creatinine, Ser: 0.64 mg/dL (ref 0.44–1.00)
GFR calc Af Amer: >60 mL/min (ref >60)
GFR calc non Af Amer: >60 mL/min (ref >60)
Glucose, Bld: 252 mg/dL — ABNORMAL HIGH (ref 70–99)
Potassium: 4 mmol/L (ref 3.5–5.1)
Sodium: 145 mmol/L (ref 135–145)

## 2020-05-23 LAB — MAGNESIUM: Magnesium: 1.8 mg/dL (ref 1.7–2.4)

## 2020-05-23 MED ORDER — IPRATROPIUM-ALBUTEROL 0.5-2.5 (3) MG/3ML IN SOLN
RESPIRATORY_TRACT | Status: AC
  Administered 2020-05-23: 3 mL via RESPIRATORY_TRACT
  Filled 2020-05-23: qty 3

## 2020-05-23 MED ORDER — LACTATED RINGERS IV BOLUS
500.0000 mL | Freq: Once | INTRAVENOUS | Status: AC
  Administered 2020-05-23: 500 mL via INTRAVENOUS

## 2020-05-23 MED ORDER — SODIUM CHLORIDE 0.9% FLUSH: 10.0000 mL | INTRAVENOUS | Status: DC | PRN

## 2020-05-23 MED ORDER — INSULIN ASPART 100 UNIT/ML ~~LOC~~ SOLN
5.0000 [IU] | SUBCUTANEOUS | Status: DC
  Administered 2020-05-23 (×3): 5 [IU] via SUBCUTANEOUS

## 2020-05-23 MED ORDER — INSULIN DETEMIR 100 UNIT/ML ~~LOC~~ SOLN
5.0000 [IU] | Freq: Once | SUBCUTANEOUS | Status: AC
  Administered 2020-05-23: 5 [IU] via SUBCUTANEOUS
  Filled 2020-05-23: qty 0.05

## 2020-05-23 MED ORDER — IPRATROPIUM-ALBUTEROL 0.5-2.5 (3) MG/3ML IN SOLN: 3.0000 mL | Freq: Four times a day (QID) | RESPIRATORY_TRACT | Status: DC | PRN

## 2020-05-23 MED ORDER — SODIUM CHLORIDE 0.9% FLUSH
10.0000 mL | Freq: Two times a day (BID) | INTRAVENOUS | Status: DC
  Administered 2020-05-23 – 2020-05-26 (×7): 10 mL

## 2020-05-23 NOTE — Progress Notes (Signed)
[facility] Progress Note Patient Name: Katherine [name] DOB: June 22, 1941 MRN: [id number]   Date of Service  05/23/2020  HPI/Events of Note  Blood sugar low normal before scheduled 20 unit Levemir dose.  eICU Interventions  Levemir 20  Unit dose held pending re-evaluation by daytime PCCM attending, Levemir 5 units sq x 1 given tonight, continue with SSI coverage as ordered.        Frederik Pear 05/23/2020, 9:33 PM

## 2020-05-23 NOTE — Progress Notes (Addendum)
NAME:  Katherine [name], MRN:  [id number], DOB:  [birth year], LOS: 4 ADMISSION DATE:  05/19/2020, CONSULTATION DATE:  05/20/20 REFERRING MD:  Dr. Ronnald Nian, CHIEF COMPLAINT: facial swelling and pain   Brief History   79 year old female, past medical history hypertension hyperlipidemia, type 2 diabetes not taking any medications for this at this time.  Underwent recent tooth extraction,  now presents with lower facial pain concerning for ludwigs angina followed by hypotension and airway compromise, ventilated.   Past Medical History   has a past medical history of Essential tremor (09/25/2019), Exotropia of right eye, Glaucoma, Hyperlipidemia, Hypertension, Incomplete right bundle branch block, LAFB (left anterior fascicular block), SUI (stress urinary incontinence, female), Type 2 diabetes mellitus ([place]), Wears dentures, and Wears glasses.  Significant Hospital Events   7/7: ETT antibiotic started culture sent 7/8 went to the operating room where she underwent incision and drainage of neck abscess, with Penrose drain placed cultures were sent 7/9: Pressors weaned off.  Supported on mechanical ventilation, extubation deferred due to upper airway swelling 7/10: Dressing changed.  Remarkable decrease in swelling but still with significant trismus raising concern for adequate airway protection.  Unasyn changed to ceftriaxone based on sensitivity data 7/11: Improving submandibular edema.  No other issues overnight with the exception of ongoing poorly controlled hyperglycemia, and difficult IV access.  Glycemic control regimen adjusted, PICC ordered  Consults:  CCM  ENT Anesthesia   Procedures:  Endotracheal intubation   Significant Diagnostic Tests:  CT Soft Tissue and Neck:1. Trans-spatial gas and fluid containing abscess of the floor of mouth, measuring 4.5 x 4.7 cm.2. Small subperiosteal abscess along the internal surface of the right mandibular body, near an empty posterior molar  socket.  Micro Data:  Blood Cultures No Growth 7/7>>> Surgical culture 7/8: few strep constellatus  Antimicrobials:  Unasyn 1.5g 7/7>> 7/10 Ceftriaxone 7/10>>>  Interim history/subjective:  Hyperglycemia really the only issue overnight in addition to difficult IV access Objective   Blood pressure 136/65, pulse (Abnormal) 115, temperature 99.2 F (37.3 C), temperature source Axillary, resp. rate 19, height 5\' 6"  (1.676 m), weight 65 kg, SpO2 93 %.    Vent Mode: CPAP;PSV FiO2 (%):  [30 %] 30 % Set Rate:  [16 bmp] 16 bmp Vt Set:  [470 mL] 470 mL PEEP:  [5 cmH20] 5 cmH20 Pressure Support:  [10 cmH20] 10 cmH20 Plateau Pressure:  [18 cmH20] 18 cmH20   Intake/Output Summary (Last 24 hours) at 05/23/2020 0749 Last data filed at 05/23/2020 9735 Gross per 24 hour  Intake 3172.22 ml  Output 570 ml  Net 2602.22 ml   Filed Weights   05/21/20 0452 05/22/20 0414 05/23/20 0402  Weight: 60.6 kg 66.1 kg 65 kg    Examination: General: 79 year old black female she is awake lying in bed not in acute distress this morning currently on pressure support ventilation  HEENT orally intubated, pupils equal reactive, swelling over submandibular space improving slowly.  Continues to have purulent drainage from Penrose drain.  Still with significant trismus but this is improved compared to yesterday's exam Pulmonary clear to auscultation equal chest rise bilaterally currently on pressure support ventilation of 10/PEEP 5.  Tidal volumes in the 4 50-500 range without notable accessory use Cardiac: Regular rate and rhythm mild tachycardia no murmur rub or gallop Abdomen soft nontender tolerating tube feeds GU clear yellow urine Neuro interactive.  Will follow commands GU clear yellow Extremities warm dry with brisk capillary refill trace lower extremity edema  Resolved Hospital Problem list  Nonanion gap metabolic acidosis Oliguria Assessment & Plan:   Ludwig's Angina with Airway Compromise  requiring endotracheal intubation  Plan  Antibiotic day #4, switched to ceftriaxone on 7/10 based on sensitivity data.  We will plan to treat for 14 days  Continue routine dressing change  Penrose drain management per ENT  Continuing mechanical ventilation, okay to cycle on pressure support ventilation however I do not think we are ready for extubation due to trismus and submandibular edema still  VAP bundle  PAD protocol with RASS goal -2  Assess daily for readiness for extubation   Difficult IV access Plan PICC line ordered  Fluid and electrolyte imbalance: Hypomagnesemia hypophosphatemia these were addressed and replaced on 7/9 Plan  Awaiting morning chemistry  Hypotension: Drug-related.  Suspect this is secondary to Precedex and propofol although some residual volume depletion could be playing a role She does get bradycardic at times particularly with up titration of Precedex Plan Continue as needed phenylephrine for mean arterial pressure greater than 65  Continue gentle hydration  Holding home lisinopril    T2DM With hyperglycemia, still suboptimal control in spite of increasing sliding scale and Levemir Plan Continue every 4 hour resistant sliding scale insulin, add basal insulin every 4 hours as well  We will hold Levemir at 20 units every 12  Holding Metformin   HLD: Plan Crestor via tube     Best practice:  Diet: NPO, tube feeds Pain/Anxiety/Delirium protocol (if indicated): on Precedex  VAP protocol (if indicated): per protocol  DVT prophylaxis: Heparin  GI prophylaxis: Protonix  Glucose control: CBG monitoring and subcutaneous insulin  Mobility: bedrest  Code Status: Full code  Family Communication: spouse updated via telephone 7/9   Disposition: ICU until patient no longer requires intubation   Labs   CBC: Recent Labs  Lab 05/19/20 1629 05/19/20 1629 05/19/20 1640 05/19/20 1706 05/20/20 0312 05/21/20 0322 05/22/20 1115  WBC 20.1*  --   --   --   17.8* 24.0* 23.0*  NEUTROABS 14.5*  --   --   --   --  18.5* 17.6*  HGB 10.0*   < > 10.9* 11.6* 8.1* 8.8* 7.2*  HCT 31.7*   < > 32.0* 34.0* 26.7* 27.1* 23.3*  MCV 89.3  --   --   --  93.4 89.4 92.1  PLT 238  --   --   --  174 210 305   < > = values in this interval not displayed.    Basic Metabolic Panel: Recent Labs  Lab 05/19/20 2212 05/20/20 0312 05/20/20 1612 05/20/20 2004 05/21/20 0003 05/21/20 0322 05/21/20 1412 05/21/20 1722 05/22/20 1115 05/22/20 1709  NA  --    < > 140 140 141 140  --   --  141  --   K  --    < > 3.4* 3.5 4.0 3.6  --   --  3.5  --   CL  --    < > 111 110 111 111  --   --  111  --   CO2  --    < > 21* 21* 18* 19*  --   --  22  --   GLUCOSE  --    < > 232* 257* 205* 163*  --   --  243*  --   BUN  --    < > 15 15 14 13   --   --  15  --   CREATININE  --    < >  0.71 0.67 0.66 0.65  --   --  0.72  --   CALCIUM  --    < > 8.3* 8.2* 8.5* 8.5*  --   --  8.5*  --   MG 1.8  --   --   --   --   --  1.1* 1.1* 1.7 2.2  PHOS 1.4*  --   --   --   --   --  1.0* <1.0* 2.6 2.8   < > = values in this interval not displayed.   GFR: Estimated Creatinine Clearance: 55.1 mL/min (by C-G formula based on SCr of 0.72 mg/dL). Recent Labs  Lab 05/19/20 1629 05/19/20 1630 05/19/20 2212 05/20/20 0312 05/20/20 1711 05/21/20 0322 05/22/20 1115  WBC 20.1*  --   --  17.8*  --  24.0* 23.0*  LATICACIDVEN  --  1.8 2.9*  --  2.1*  --   --     Liver Function Tests: Recent Labs  Lab 05/19/20 1629 05/22/20 1115  AST 23 37  ALT 26 35  ALKPHOS 148* 139*  BILITOT 1.5* 0.7  PROT 6.8 5.4*  ALBUMIN 3.0* 2.0*   No results for input(s): LIPASE, AMYLASE in the last 168 hours. No results for input(s): AMMONIA in the last 168 hours.  ABG    Component Value Date/Time   HCO3 23.6 05/19/2020 1706   TCO2 25 05/19/2020 1706   O2SAT 86.0 05/19/2020 1706     Coagulation Profile: No results for input(s): INR, PROTIME in the last 168 hours.  Cardiac Enzymes: No results for  input(s): CKTOTAL, CKMB, CKMBINDEX, TROPONINI in the last 168 hours.  HbA1C: Hgb A1c MFr Bld  Date/Time Value Ref Range Status  05/19/2020 10:12 PM 10.3 (H) 4.8 - 5.6 % Final    Comment:    (NOTE) Pre diabetes:          5.7%-6.4%  Diabetes:              >6.4%  Glycemic control for   <7.0% adults with diabetes   06/25/2012 09:59 AM 6.5 (H) <5.7 % Final    Comment:    (NOTE)                                                                       According to the ADA Clinical Practice Recommendations for 2011, when HbA1c is used as a screening test:  >=6.5%   Diagnostic of Diabetes Mellitus           (if abnormal result is confirmed) 5.7-6.4%   Increased risk of developing Diabetes Mellitus References:Diagnosis and Classification of Diabetes Mellitus,Diabetes IRJJ,8841,66(AYTKZ 1):S62-S69 and Standards of Medical Care in         Diabetes - 2011,Diabetes Care,2011,34 (Suppl 1):S11-S61.    CBG: Recent Labs  Lab 05/22/20 2026 05/22/20 2037 05/22/20 2259 05/23/20 0347 05/23/20 0723  GLUCAP 275* 262* 293* 264* 209*     Critical care time:  33 minutes  Erick Colace ACNP-BC [place] Pager # [phone] OR # [phone] if no answer

## 2020-05-23 NOTE — Progress Notes (Signed)
[facility] Progress Note Patient Name: Katherine [name] DOB: Apr 16, 1941 MRN: [id number]   Date of Service  05/23/2020  HPI/Events of Note  Having IV access issues and unable to extract blood for labs  eICU Interventions  Consult IV team     Intervention Category Minor Interventions: Other:  Judd Lien 05/23/2020, 4:08 AM

## 2020-05-23 NOTE — Progress Notes (Signed)
Peripherally Inserted Central Catheter Placement  The IV Nurse has discussed with the patient and/or persons authorized to consent for the patient, the purpose of this procedure and the potential benefits and risks involved with this procedure.  The benefits include less needle sticks, lab draws from the catheter, and the patient may be discharged home with the catheter. Risks include, but not limited to, infection, bleeding, blood clot (thrombus formation), and puncture of an artery; nerve damage and irregular heartbeat and possibility to perform a PICC exchange if needed/ordered by physician.  Alternatives to this procedure were also discussed.  Bard Power PICC patient education guide, fact sheet on infection prevention and patient information card has been provided to patient /or left at bedside. Consent obtained by Chilton Greathouse, husband.    PICC Placement Documentation  PICC Double Lumen 63/14/97 PICC Right Basilic 40 cm 2 cm (Active)  Indication for Insertion or Continuance of Line Vasoactive infusions 05/23/20 1000  Exposed Catheter (cm) 2 cm 05/23/20 1000  Site Assessment Clean;Dry;Intact 05/23/20 1000  Lumen #1 Status Flushed;Blood return noted;Saline locked 05/23/20 1000  Lumen #2 Status Flushed;Blood return noted;Saline locked 05/23/20 1000  Dressing Type Transparent 05/23/20 1000  Dressing Status Clean;Dry;Intact;Antimicrobial disc in place 05/23/20 1000  Safety Lock Not Applicable 02/63/78 5885  Line Care Connections checked and tightened 05/23/20 1000  Line Adjustment (NICU/IV Team Only) No 05/23/20 1000  Dressing Intervention New dressing 05/23/20 1000  Dressing Change Due 05/30/20 05/23/20 1000       Lorenza Cambridge 05/23/2020, 10:10 AM

## 2020-05-23 NOTE — Progress Notes (Signed)
Spoke with Selinda Orion, RN c/o PICC. Patient has been on vasopressers >24 hours, has had difficulty obtaining blood, and could possibly need long term antibiotics considering her diagnosis. Patient presently has four PIVs. Plan is to move forward this morning considering inability to consistently draw blood.

## 2020-05-24 LAB — GLUCOSE, CAPILLARY
Glucose-Capillary: 235 mg/dL — ABNORMAL HIGH (ref 70–99)
Glucose-Capillary: 252 mg/dL — ABNORMAL HIGH (ref 70–99)
Glucose-Capillary: 202 mg/dL — ABNORMAL HIGH (ref 70–99)
Glucose-Capillary: 107 mg/dL — ABNORMAL HIGH (ref 70–99)
Glucose-Capillary: 199 mg/dL — ABNORMAL HIGH (ref 70–99)
Glucose-Capillary: 261 mg/dL — ABNORMAL HIGH (ref 70–99)

## 2020-05-24 LAB — CBC WITH DIFFERENTIAL/PLATELET
Abs Immature Granulocytes: 0.48 10*3/uL — ABNORMAL HIGH (ref 0.00–0.07)
Basophils Absolute: 0.1 10*3/uL (ref 0.0–0.1)
Basophils Relative: 1 %
Eosinophils Absolute: 0.1 10*3/uL (ref 0.0–0.5)
Eosinophils Relative: 1 %
HCT: 24.4 % — ABNORMAL LOW (ref 36.0–46.0)
Hemoglobin: 7.1 g/dL — ABNORMAL LOW (ref 12.0–15.0)
Immature Granulocytes: 2 %
Lymphocytes Relative: 16 %
Lymphs Abs: 3.4 10*3/uL (ref 0.7–4.0)
MCH: 28.5 pg (ref 26.0–34.0)
MCHC: 29.1 g/dL — ABNORMAL LOW (ref 30.0–36.0)
MCV: 98 fL (ref 80.0–100.0)
Monocytes Absolute: 1.7 10*3/uL — ABNORMAL HIGH (ref 0.1–1.0)
Monocytes Relative: 8 %
Neutro Abs: 15.1 10*3/uL — ABNORMAL HIGH (ref 1.7–7.7)
Neutrophils Relative %: 72 %
Platelets: 364 10*3/uL (ref 150–400)
RBC: 2.49 MIL/uL — ABNORMAL LOW (ref 3.87–5.11)
RDW: 15.1 % (ref 11.5–15.5)
WBC: 20.9 10*3/uL — ABNORMAL HIGH (ref 4.0–10.5)
nRBC: 0.1 % (ref 0.0–0.2)

## 2020-05-24 LAB — CULTURE, BLOOD (ROUTINE X 2)
Culture: NO GROWTH
Culture: NO GROWTH
Special Requests: ADEQUATE
Special Requests: ADEQUATE

## 2020-05-24 LAB — BASIC METABOLIC PANEL
Anion gap: 9 (ref 5–15)
BUN: 11 mg/dL (ref 8–23)
CO2: 22 mmol/L (ref 22–32)
Calcium: 8.5 mg/dL — ABNORMAL LOW (ref 8.9–10.3)
Chloride: 113 mmol/L — ABNORMAL HIGH (ref 98–111)
Creatinine, Ser: 0.55 mg/dL (ref 0.44–1.00)
GFR calc Af Amer: >60 mL/min (ref >60)
GFR calc non Af Amer: >60 mL/min (ref >60)
Glucose, Bld: 236 mg/dL — ABNORMAL HIGH (ref 70–99)
Potassium: 4.3 mmol/L (ref 3.5–5.1)
Sodium: 144 mmol/L (ref 135–145)

## 2020-05-24 MED ORDER — POLYETHYLENE GLYCOL 3350 17 G PO PACK
17.0000 g | PACK | Freq: Every day | ORAL | Status: DC
  Administered 2020-05-24 – 2020-05-25 (×2): 17 g
  Filled 2020-05-24: qty 1

## 2020-05-24 MED ORDER — DOCUSATE SODIUM 50 MG/5ML PO LIQD
100.0000 mg | Freq: Two times a day (BID) | ORAL | Status: DC
  Administered 2020-05-24 – 2020-05-25 (×3): 100 mg
  Filled 2020-05-24 (×2): qty 10

## 2020-05-24 MED ORDER — INSULIN DETEMIR 100 UNIT/ML ~~LOC~~ SOLN
10.0000 [IU] | Freq: Two times a day (BID) | SUBCUTANEOUS | Status: DC
  Administered 2020-05-24 (×2): 10 [IU] via SUBCUTANEOUS
  Filled 2020-05-24 (×5): qty 0.1

## 2020-05-24 MED ORDER — SODIUM CHLORIDE 0.9 % IV SOLN
INTRAVENOUS | Status: DC | PRN
  Administered 2020-05-24 – 2020-05-26 (×2): 250 mL via INTRAVENOUS

## 2020-05-24 MED ORDER — FUROSEMIDE 10 MG/ML IJ SOLN
40.0000 mg | Freq: Once | INTRAMUSCULAR | Status: AC
  Administered 2020-05-24: 40 mg via INTRAVENOUS
  Filled 2020-05-24: qty 4

## 2020-05-24 NOTE — Progress Notes (Signed)
   ENT Progress Note: POD #4 s/p Procedure(s): INCISION AND DRAINAGE OF NECK ABSCESS   Subjective: Patient intubated and sedated  Objective: Vital signs in last 24 hours: Temp:  [98.9 F (37.2 C)-99.6 F (37.6 C)] 99.4 F (37.4 C) (07/12 0817) Pulse Rate:  [64-116] 80 (07/12 0815) Resp:  [12-27] 16 (07/12 0815) BP: (81-166)/(31-130) 145/48 (07/12 0815) SpO2:  [100 %] 100 % (07/12 0815) FiO2 (%):  [30 %] 30 % (07/12 0440) Weight:  [67.6 kg] 67.6 kg (07/12 0409) Weight change: 2.6 kg Last BM Date:  (PTA)  Intake/Output from previous day: 07/11 0701 - 07/12 0700 In: 3889.2 [I.V.:2160.3; NG/GT:1120; IV Piggyback:609] Out: 4259 [Urine:1465] Intake/Output this shift: No intake/output data recorded.  Labs: Recent Labs    05/23/20 1033 05/24/20 0225  WBC 21.6* 20.9*  HGB 7.3* 7.1*  HCT 23.9* 24.4*  PLT 362 364   Recent Labs    05/23/20 1033 05/24/20 0225  NA 145 144  K 4.0 4.3  CL 113* 113*  CO2 23 22  GLUCOSE 252* 236*  BUN 16 11  CALCIUM 8.6* 8.5*    Studies/Results: DG CHEST PORT 1 VIEW  Result Date: 05/23/2020 CLINICAL DATA:  Respiratory failure EXAM: PORTABLE CHEST 1 VIEW COMPARISON:  Chest x-ray dated 05/20/2020. FINDINGS: Heart size and mediastinal contours are within normal limits. Endotracheal tube is well positioned above the level of the carina. Enteric tube passes below the diaphragm. Lungs are clear. No pleural effusion or pneumothorax is seen. IMPRESSION: 1. Lungs are clear.  No evidence of pneumonia or pulmonary edema. 2. Support apparatus appears appropriately positioned. Electronically Signed   By: Franki Cabot M.D.   On: 05/23/2020 07:50   [place] EKG SITE RITE  Result Date: 05/23/2020 If Site Rite image not attached, placement could not be confirmed due to current cardiac rhythm.    PHYSICAL EXAM: Penrose drains are in place with moderate amount of drainage.  Continued firm swelling of the submandibular soft tissues.  Significant overall  improvement from previous examination.   Assessment/Plan: Patient stable, continue current IV antibiotic therapy.  Swelling is improving but continued soft tissue fullness/firmness.  Discharge decreasing.  Will monitor and plan gradual removal of Penrose drains.    Jerrell Belfast 05/24/2020, 8:18 AM

## 2020-05-24 NOTE — Progress Notes (Signed)
NAME:  Katherine [name], MRN:  [id number], DOB:  Feb 14, 1941, LOS: 5 ADMISSION DATE:  05/19/2020, CONSULTATION DATE:  05/20/20 REFERRING MD:  Dr. Ronnald Nian, CHIEF COMPLAINT: facial swelling and pain   Brief History   79 year old female, past medical history hypertension hyperlipidemia, type 2 diabetes not taking any medications for this at this time.  Underwent recent tooth extraction,  now presents with lower facial pain concerning for ludwigs angina followed by hypotension and airway compromise, ventilated.   Past Medical History   has a past medical history of Essential tremor (09/25/2019), Exotropia of right eye, Glaucoma, Hyperlipidemia, Hypertension, Incomplete right bundle branch block, LAFB (left anterior fascicular block), SUI (stress urinary incontinence, female), Type 2 diabetes mellitus ([place]), Wears dentures, and Wears glasses.  Significant Hospital Events   7/7: ETT antibiotic started culture sent 7/8 went to the operating room where she underwent incision and drainage of neck abscess, with Penrose drain placed cultures were sent 7/9: Pressors weaned off.  Supported on mechanical ventilation, extubation deferred due to upper airway swelling 7/10: Dressing changed.  Remarkable decrease in swelling but still with significant trismus raising concern for adequate airway protection.  Unasyn changed to ceftriaxone based on sensitivity data 7/11: Improving submandibular edema.  No other issues overnight with the exception of ongoing poorly controlled hyperglycemia, and difficult IV access.  Glycemic control regimen adjusted, PICC ordered  Consults:  CCM  ENT Anesthesia   Procedures:  Endotracheal intubation   Significant Diagnostic Tests:  CT Soft Tissue and Neck:1. Trans-spatial gas and fluid containing abscess of the floor of mouth, measuring 4.5 x 4.7 cm.2. Small subperiosteal abscess along the internal surface of the right mandibular body, near an empty posterior molar  socket.  Micro Data:  Blood Cultures No Growth 7/7>>> Surgical culture 7/8: few strep constellatus  Antimicrobials:  Unasyn 1.5g 7/7>> 7/10 Ceftriaxone 7/10>>>  Interim history/subjective:  Hyperglycemia really the only issue overnight in addition to difficult IV access Objective   Blood pressure (!) 145/48, pulse 80, temperature 98.8 F (37.1 C), temperature source Oral, resp. rate 16, height 5\' 6"  (1.676 m), weight 67.6 kg, SpO2 100 %.    Vent Mode: PRVC FiO2 (%):  [30 %] 30 % Set Rate:  [16 bmp] 16 bmp Vt Set:  [470 mL] 470 mL PEEP:  [5 cmH20] 5 cmH20 Plateau Pressure:  [15 cmH20-20 cmH20] 15 cmH20   Intake/Output Summary (Last 24 hours) at 05/24/2020 1125 Last data filed at 05/24/2020 4081 Gross per 24 hour  Intake 2907.02 ml  Output 1740 ml  Net 1167.02 ml   Filed Weights   05/22/20 0414 05/23/20 0402 05/24/20 0409  Weight: 66.1 kg 65 kg 67.6 kg    Examination: General: 79 year old black female she is awake lying in bed not in acute distress this morning currently on pressure support ventilation  HEENT orally intubated, pupils equal reactive, swelling over submandibular space improving slowly.  Continues to have purulent drainage from Penrose drain.  Still with significant trismus but this is improved compared to yesterday's exam Pulmonary clear to auscultation equal chest rise bilaterally currently on pressure support ventilation of 10/PEEP 5.  Tidal volumes in the 4 50-500 range without notable accessory use Cardiac: Regular rate and rhythm mild tachycardia no murmur rub or gallop Abdomen soft nontender tolerating tube feeds GU clear yellow urine Neuro interactive.  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  - start levemir 10 units BID  - CBG monitoring  - resistant sliding scale insulin   HLD Crestor via tube  Best practice:  Diet: NPO, tube feeds Pain/Anxiety/Delirium protocol (if indicated): on Precedex  VAP protocol (if indicated): per protocol  DVT prophylaxis: Heparin  GI prophylaxis: Protonix  Glucose control: CBG monitoring and subcutaneous insulin  Mobility: bedrest  Code Status: Full code  Family Communication: spouse updated via telephone 7/12  Disposition: ICU until patient no longer requires intubation   Labs   CBC: Recent Labs  Lab 05/19/20 1629 05/19/20 1640 05/20/20 0312 05/21/20 0322 05/22/20 1115 05/23/20 1033 05/24/20 0225  WBC 20.1*   < > 17.8* 24.0* 23.0* 21.6* 20.9*  NEUTROABS 14.5*  --   --  18.5* 17.6* 16.0* 15.1*  HGB 10.0*   < > 8.1* 8.8* 7.2* 7.3* 7.1*  HCT 31.7*   < > 26.7* 27.1* 23.3* 23.9* 24.4*  MCV 89.3   < > 93.4 89.4 92.1 94.1 98.0  PLT 238   < > 174 210 305 362 364   < > = values in this interval not displayed.    Basic Metabolic Panel: Recent Labs  Lab 05/19/20 2212 05/21/20 0003 05/21/20 0322 05/21/20 1412 05/21/20 1722 05/22/20 1115 05/22/20 1709 05/23/20 1033 05/24/20 0225  NA  --  141 140  --   --  141  --  145 144  K  --  4.0 3.6  --   --  3.5  --  4.0 4.3  CL  --  111 111  --   --  111  --  113* 113*  CO2  --  18* 19*  --   --  22  --  23 22  GLUCOSE  --  205* 163*  --   --  243*  --  252* 236*  BUN  --  14 13  --   --  15  --  16 11  CREATININE  --  0.66 0.65  --   --  0.72  --  0.64 0.55  CALCIUM  --  8.5*  8.5*  --   --  8.5*  --  8.6* 8.5*  MG   < >  --   --  1.1* 1.1* 1.7 2.2 1.8  --   PHOS   < >  --   --  1.0* <1.0* 2.6 2.8 3.6  --    < > = values in this interval not displayed.   GFR: Estimated Creatinine Clearance: 55.1 mL/min (by C-G formula based on SCr of 0.55 mg/dL). Recent Labs  Lab 05/19/20 1630 05/19/20 2212 05/20/20 0312 05/20/20 1711 05/21/20 0322 05/22/20 1115 05/23/20 1033 05/24/20 0225  WBC  --   --    < >  --  24.0* 23.0* 21.6* 20.9*  LATICACIDVEN 1.8 2.9*  --  2.1*  --   --   --   --    < > = values in this interval not displayed.    Liver  Function Tests: Recent Labs  Lab 05/19/20 1629 05/22/20 1115  AST 23 37  ALT 26 35  ALKPHOS 148* 139*  BILITOT 1.5* 0.7  PROT 6.8 5.4*  ALBUMIN 3.0* 2.0*   No results for input(s): LIPASE, AMYLASE in the last 168 hours. No results for input(s): AMMONIA in the last 168 hours.  ABG    Component Value Date/Time   HCO3 23.6 05/19/2020 1706   TCO2 25 05/19/2020 1706   O2SAT 86.0 05/19/2020 1706     Coagulation Profile: No results for input(s): INR, PROTIME in the last 168 hours.  Cardiac Enzymes: No results for input(s): CKTOTAL, CKMB, CKMBINDEX, TROPONINI in the last 168 hours.  HbA1C: Hgb A1c MFr Bld  Date/Time Value Ref Range Status  05/19/2020 10:12 PM 10.3 (H) 4.8 - 5.6 % Final    Comment:    (NOTE) Pre diabetes:          5.7%-6.4%  Diabetes:              >6.4%  Glycemic control for   <7.0% adults with diabetes   06/25/2012 09:59 AM 6.5 (H) <5.7 % Final    Comment:    (NOTE)                                                                       According to the ADA Clinical Practice Recommendations for 2011, when HbA1c is used as a screening test:  >=6.5%   Diagnostic of Diabetes Mellitus           (if abnormal result is confirmed) 5.7-6.4%   Increased risk of developing Diabetes Mellitus References:Diagnosis and Classification of Diabetes Mellitus,Diabetes MCRF,5436,06(VPCHE 1):S62-S69 and  Standards of Medical Care in         Diabetes - 2011,Diabetes Care,2011,34 (Suppl 1):S11-S61.    CBG: Recent Labs  Lab 05/23/20 2050 05/23/20 2051 05/23/20 2343 05/24/20 0350 05/24/20 0812  GLUCAP 115* 121* 207* 235* 252*     Critical care time:  33 minutes   Eulis Foster, MD  [place]  PGY-2 05/24/20

## 2020-05-24 NOTE — Procedures (Signed)
Cortrak  Person Inserting Tube:  Esaw Dace, RD Tube Type:  Cortrak - 43 inches Tube Location:  Right nare Initial Placement:  Stomach Secured by: Bridle Technique Used to Measure Tube Placement:  Documented cm marking at nare/ corner of mouth Cortrak Secured At:  66 cm Procedure Comments:  Cortrak Tube Team Note:  Consult received to place a Cortrak feeding tube. Pt had Cortrak intitially placed on 7/09. At some point today, Cortrak became dislodged and came completely out. Bridle remained in place.   Previous Cortrak tube was at bedside and flushed well. RD successfully replaced Cortrak (using previous tube). Tube clipped in place with current retained bridle.   No x-ray is required. RN may begin using tube.   If the tube becomes dislodged please keep the tube and contact the Cortrak team at [email or web address] (password [email or web address]) for replacement.  If after hours and replacement cannot be delayed, place a NG tube and confirm placement with an abdominal x-ray.   Kerman Passey MS, RDN, LDN, CNSC Registered Dietitian III Clinical Nutrition RD Pager and On-Call Pager Number Located in [place]

## 2020-05-25 DIAGNOSIS — A419 Sepsis, unspecified organism: Secondary | ICD-10-CM

## 2020-05-25 LAB — CBC WITH DIFFERENTIAL/PLATELET
Abs Immature Granulocytes: 0.26 10*3/uL — ABNORMAL HIGH (ref 0.00–0.07)
Abs Immature Granulocytes: 0.3 10*3/uL — ABNORMAL HIGH (ref 0.00–0.07)
Basophils Absolute: 0.1 10*3/uL (ref 0.0–0.1)
Basophils Absolute: 0.1 10*3/uL (ref 0.0–0.1)
Basophils Relative: 0 %
Basophils Relative: 0 %
Eosinophils Absolute: 0.2 10*3/uL (ref 0.0–0.5)
Eosinophils Absolute: 0.2 10*3/uL (ref 0.0–0.5)
Eosinophils Relative: 1 %
Eosinophils Relative: 1 %
HCT: 18.5 % — ABNORMAL LOW (ref 36.0–46.0)
HCT: 20.3 % — ABNORMAL LOW (ref 36.0–46.0)
Hemoglobin: 5.6 g/dL — CL (ref 12.0–15.0)
Hemoglobin: 6 g/dL — CL (ref 12.0–15.0)
Immature Granulocytes: 2 %
Immature Granulocytes: 2 %
Lymphocytes Relative: 15 %
Lymphocytes Relative: 15 %
Lymphs Abs: 2.7 10*3/uL (ref 0.7–4.0)
Lymphs Abs: 2.9 10*3/uL (ref 0.7–4.0)
MCH: 28.7 pg (ref 26.0–34.0)
MCH: 28 pg (ref 26.0–34.0)
MCHC: 30.3 g/dL (ref 30.0–36.0)
MCHC: 29.6 g/dL — ABNORMAL LOW (ref 30.0–36.0)
MCV: 94.9 fL (ref 80.0–100.0)
MCV: 94.9 fL (ref 80.0–100.0)
Monocytes Absolute: 1.6 10*3/uL — ABNORMAL HIGH (ref 0.1–1.0)
Monocytes Absolute: 1.4 10*3/uL — ABNORMAL HIGH (ref 0.1–1.0)
Monocytes Relative: 9 %
Monocytes Relative: 8 %
Neutro Abs: 12.9 10*3/uL — ABNORMAL HIGH (ref 1.7–7.7)
Neutro Abs: 13.7 10*3/uL — ABNORMAL HIGH (ref 1.7–7.7)
Neutrophils Relative %: 73 %
Neutrophils Relative %: 74 %
Platelets: 349 10*3/uL (ref 150–400)
Platelets: 410 10*3/uL — ABNORMAL HIGH (ref 150–400)
RBC: 1.95 MIL/uL — ABNORMAL LOW (ref 3.87–5.11)
RBC: 2.14 MIL/uL — ABNORMAL LOW (ref 3.87–5.11)
RDW: 15.3 % (ref 11.5–15.5)
RDW: 15.1 % (ref 11.5–15.5)
WBC: 17.6 10*3/uL — ABNORMAL HIGH (ref 4.0–10.5)
WBC: 18.5 10*3/uL — ABNORMAL HIGH (ref 4.0–10.5)
nRBC: 0 % (ref 0.0–0.2)
nRBC: 0 % (ref 0.0–0.2)

## 2020-05-25 LAB — BASIC METABOLIC PANEL
Anion gap: 6 (ref 5–15)
BUN: 11 mg/dL (ref 8–23)
CO2: 28 mmol/L (ref 22–32)
Calcium: 8.1 mg/dL — ABNORMAL LOW (ref 8.9–10.3)
Chloride: 109 mmol/L (ref 98–111)
Creatinine, Ser: 0.54 mg/dL (ref 0.44–1.00)
GFR calc Af Amer: >60 mL/min (ref >60)
GFR calc non Af Amer: >60 mL/min (ref >60)
Glucose, Bld: 279 mg/dL — ABNORMAL HIGH (ref 70–99)
Potassium: 3.9 mmol/L (ref 3.5–5.1)
Sodium: 143 mmol/L (ref 135–145)

## 2020-05-25 LAB — HEMOGLOBIN AND HEMATOCRIT, BLOOD
HCT: 21.1 % — ABNORMAL LOW (ref 36.0–46.0)
Hemoglobin: 6.2 g/dL — CL (ref 12.0–15.0)

## 2020-05-25 LAB — GLUCOSE, CAPILLARY
Glucose-Capillary: 181 mg/dL — ABNORMAL HIGH (ref 70–99)
Glucose-Capillary: 211 mg/dL — ABNORMAL HIGH (ref 70–99)
Glucose-Capillary: 231 mg/dL — ABNORMAL HIGH (ref 70–99)
Glucose-Capillary: 238 mg/dL — ABNORMAL HIGH (ref 70–99)
Glucose-Capillary: 238 mg/dL — ABNORMAL HIGH (ref 70–99)
Glucose-Capillary: 242 mg/dL — ABNORMAL HIGH (ref 70–99)
Glucose-Capillary: 97 mg/dL (ref 70–99)

## 2020-05-25 LAB — ABO/RH: ABO/RH(D): O POS

## 2020-05-25 LAB — AEROBIC/ANAEROBIC CULTURE W GRAM STAIN (SURGICAL/DEEP WOUND): Gram Stain: NONE SEEN

## 2020-05-25 LAB — PREPARE RBC (CROSSMATCH)

## 2020-05-25 LAB — MAGNESIUM: Magnesium: 1.4 mg/dL — ABNORMAL LOW (ref 1.7–2.4)

## 2020-05-25 MED ORDER — DEXMEDETOMIDINE HCL IN NACL 400 MCG/100ML IV SOLN
0.4000 ug/kg/h | INTRAVENOUS | Status: DC
  Administered 2020-05-25: 0.5 ug/kg/h via INTRAVENOUS
  Administered 2020-05-26: 0.4 ug/kg/h via INTRAVENOUS
  Filled 2020-05-25: qty 100

## 2020-05-25 MED ORDER — MAGIC MOUTHWASH
5.0000 mL | Freq: Four times a day (QID) | ORAL | Status: DC
  Administered 2020-05-25 – 2020-06-05 (×43): 5 mL via ORAL
  Filled 2020-05-25 (×50): qty 5

## 2020-05-25 MED ORDER — ORAL CARE MOUTH RINSE
15.0000 mL | Freq: Two times a day (BID) | OROMUCOSAL | Status: DC
  Administered 2020-05-25 – 2020-05-27 (×4): 15 mL via OROMUCOSAL

## 2020-05-25 MED ORDER — MAGNESIUM SULFATE 2 GM/50ML IV SOLN
2.0000 g | Freq: Every day | INTRAVENOUS | Status: AC
  Administered 2020-05-25 – 2020-05-26 (×2): 2 g via INTRAVENOUS
  Filled 2020-05-25 (×2): qty 50

## 2020-05-25 MED ORDER — OXYCODONE HCL 5 MG PO TABS
5.0000 mg | ORAL_TABLET | Freq: Four times a day (QID) | ORAL | Status: DC
  Administered 2020-05-25 – 2020-05-27 (×8): 5 mg
  Filled 2020-05-25 (×8): qty 1

## 2020-05-25 MED ORDER — PANTOPRAZOLE SODIUM 40 MG PO PACK
40.0000 mg | PACK | Freq: Every day | ORAL | Status: DC
  Administered 2020-05-25 – 2020-05-27 (×3): 40 mg
  Filled 2020-05-25 (×2): qty 20

## 2020-05-25 MED ORDER — INSULIN DETEMIR 100 UNIT/ML ~~LOC~~ SOLN
20.0000 [IU] | Freq: Two times a day (BID) | SUBCUTANEOUS | Status: DC
  Administered 2020-05-25 – 2020-05-27 (×5): 20 [IU] via SUBCUTANEOUS
  Filled 2020-05-25 (×6): qty 0.2

## 2020-05-25 MED ORDER — SODIUM CHLORIDE 0.9% IV SOLUTION: Freq: Once | INTRAVENOUS | Status: AC

## 2020-05-25 NOTE — Progress Notes (Signed)
[facility] Progress Note Patient Name: Katherine [name] DOB: [birth year] MRN: [id number]   Date of Service  05/25/2020  HPI/Events of Note  Anemia - Hgb = 6.0.   eICU Interventions  Will transfuse 1 unit PRBC.     Intervention Category Major Interventions: Other:  Lysle Dingwall 05/25/2020, 4:54 AM

## 2020-05-25 NOTE — Progress Notes (Addendum)
CRITICAL VALUE ALERT  Critical Value:  Hemoglobin - 6.0  Date & Time Notied:  07/13 04:45  Provider Notified: Yes  Orders Received/Actions taken: Administer 1 unit of RBC

## 2020-05-25 NOTE — Progress Notes (Signed)
Inpatient Diabetes Program Recommendations  AACE/ADA: New Consensus Statement on Inpatient Glycemic Control (2015)  Target Ranges:  Prepandial:   less than 140 mg/dL      Peak postprandial:   less than 180 mg/dL (1-2 hours)      Critically ill patients:  140 - 180 mg/dL   Lab Results  Component Value Date   GLUCAP 242 (H) 05/25/2020   HGBA1C 10.3 (H) 05/19/2020    Review of Glycemic Control Results for Katherine [name], Katherine [name] (MRN [id number]) as of 05/25/2020 09:38  Ref. Range 05/24/2020 19:26 05/24/2020 23:06 05/25/2020 03:36 05/25/2020 07:31  Glucose-Capillary Latest Ref Range: 70 - 99 mg/dL 199 (H) 261 (H) 238 (H) 242 (H)   Diabetes history: Type 2 DM Outpatient Diabetes medications: Metformin 500 mg BID Current orders for Inpatient glycemic control: Levemir 10 units BID, Novolog 0-20 units Q4H  Inpatient Diabetes Program Recommendations:    Consider increasing Levemir 15 units BID and adding Novolog 4 units Q4H (for tube feed coverage to stop in the event tube feeds are stopped/held).   Thanks, Bronson Curb, MSN, RNC-OB Diabetes Coordinator [phone] (8a-5p)

## 2020-05-25 NOTE — Progress Notes (Addendum)
NAME:  Katherine [name], MRN:  [id number], DOB:  1941/07/04, LOS: 6 ADMISSION DATE:  05/19/2020, CONSULTATION DATE:  05/20/20 REFERRING MD:  Dr. Ronnald Nian, CHIEF COMPLAINT: facial swelling and pain   Brief History   79 year old female, past medical history hypertension hyperlipidemia, type 2 diabetes.  Patient recently underwent  tooth extraction and later presented with lower facial pain concerning for ludwigs angina followed by hypotension and airway compromise for which she has been ventilated. Patient is now s/p I&D with penrose drain, not on vasopressors at the moment.    Past Medical History   has a past medical history of Essential tremor (09/25/2019), Exotropia of right eye, Glaucoma, Hyperlipidemia, Hypertension, Incomplete right bundle branch block, LAFB (left anterior fascicular block), SUI (stress urinary incontinence, female), Type 2 diabetes mellitus ([place]), Wears dentures, and Wears glasses.  Significant Hospital Events   7/7: ETT antibiotic started culture sent 7/8 went to the operating room where she underwent incision and drainage of neck abscess, with Penrose drain placed cultures were sent 7/9: Pressors weaned off.  Supported on mechanical ventilation, extubation deferred due to upper airway swelling 7/10: Dressing changed.  Remarkable decrease in swelling but still with significant trismus raising concern for adequate airway protection.  Unasyn changed to ceftriaxone based on sensitivity data 7/11: Improving submandibular edema.  No other issues overnight with the exception of ongoing poorly controlled hyperglycemia, and difficult IV access.  Glycemic control regimen adjusted, PICC ordered  Consults:  CCM  ENT Anesthesia   Procedures:  Endotracheal intubation   Significant Diagnostic Tests:  CT Soft Tissue and Neck:1. Trans-spatial gas and fluid containing abscess of the floor of mouth, measuring 4.5 x 4.7 cm.2. Small subperiosteal abscess along the internal surface of  the right mandibular body, near an empty posterior molar socket.  Micro Data:  Blood Cultures No Growth 7/7>>> Surgical culture 7/8: few strep constellatus  Antimicrobials:  Unasyn 1.5g 7/7>> 7/10 Ceftriaxone 7/10>>>  Interim history/subjective:  Hemoglobin dropped to 6, patient ordered one 1 unit of pRBCs.   Objective   Blood pressure 134/70, pulse 92, temperature 98.1 F (36.7 C), temperature source Axillary, resp. rate (!) 21, height 5\' 6"  (1.676 m), weight 64.3 kg, SpO2 100 %.    Vent Mode: PRVC FiO2 (%):  [30 %] 30 % Set Rate:  [16 bmp] 16 bmp Vt Set:  [470 mL] 470 mL PEEP:  [5 cmH20] 5 cmH20 Pressure Support:  [8 cmH20] 8 cmH20 Plateau Pressure:  [16 cmH20-19 cmH20] 19 cmH20   Intake/Output Summary (Last 24 hours) at 05/25/2020 4332 Last data filed at 05/25/2020 9518 Gross per 24 hour  Intake 2388.63 ml  Output 2395 ml  Net -6.37 ml   Filed Weights   05/23/20 0402 05/24/20 0409 05/25/20 0340  Weight: 65 kg 67.6 kg 64.3 kg    Examination: General: female appearing stated age and intermittently uncomfortable, ventilated  HEENT: continues to have induration, hyperpigmentation and dried skin around penrose drain, tenderness to palpation of incision site, serosanguinous drainage Pulmonary: transmitted mechanical ventilation sounds, no retractions, ventilated on PRVC 8/5 Cardiac: RRR without murmur  Abdomen: soft, non tender, bowel sounds minimal but present  GU: fair yellow urine  Neuro: alert, follows commands Extremities: still with edema in bilateral feet, L>R, improved upper extremity edema   Resolved Hospital Problem list   Nonanion gap metabolic acidosis Oliguria  Assessment & Plan:   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  - Antibiotic treatment with CTX day #6/14 - Continue routine dressing change  -  Penrose drain management per ENT  - Continuing mechanical ventilation - VAP bundle  - PAD protocol with RASS goal -2   - Assess daily for readiness for extubation   Hypotension, improved BP have been WNL with some hypertensive systolic measurements  - wean vasopressors  - Holding home lisinopril    T2DM  CBGs ranged 240-260 - increase levemir from 10 units to 20 units BID  - CBG monitoring  - resistant sliding scale insulin   HLD Crestor via tube  Best practice:  Diet: NPO, tube feeds Pain/Anxiety/Delirium protocol (if indicated): Fentanyl, Oxycodone VAP protocol (if indicated): per protocol  DVT prophylaxis: Heparin  GI prophylaxis: Protonix  Glucose control: CBG monitoring and subcutaneous insulin  Mobility: bedrest  Code Status: Full code  Family Communication: patient's spouse updated via telephone 7/13  Disposition: ICU until patient no longer requires intubation   Labs   CBC: Recent Labs  Lab 05/22/20 1115 05/23/20 1033 05/24/20 0225 05/25/20 0332 05/25/20 0404  WBC 23.0* 21.6* 20.9* 17.6* 18.5*  NEUTROABS 17.6* 16.0* 15.1* 12.9* 13.7*  HGB 7.2* 7.3* 7.1* 5.6* 6.0*  HCT 23.3* 23.9* 24.4* 18.5* 20.3*  MCV 92.1 94.1 98.0 94.9 94.9  PLT 305 362 364 349 410*    Basic Metabolic Panel: Recent Labs  Lab 05/19/20 2212 05/21/20 0322 05/21/20 1412 05/21/20 1412 05/21/20 1722 05/22/20 1115 05/22/20 1709 05/23/20 1033 05/24/20 0225 05/25/20 0332  NA  --  140  --   --   --  141  --  145 144 143  K  --  3.6  --   --   --  3.5  --  4.0 4.3 3.9  CL  --  111  --   --   --  111  --  113* 113* 109  CO2  --  19*  --   --   --  22  --  23 22 28   GLUCOSE  --  163*  --   --   --  243*  --  252* 236* 279*  BUN  --  13  --   --   --  15  --  16 11 11   CREATININE  --  0.65  --   --   --  0.72  --  0.64 0.55 0.54  CALCIUM  --  8.5*  --   --   --  8.5*  --  8.6* 8.5* 8.1*  MG   < >  --  1.1*   < > 1.1* 1.7 2.2 1.8  --  1.4*  PHOS   < >  --  1.0*  --  <1.0* 2.6 2.8 3.6  --   --    < > = values in this interval not displayed.   GFR: Estimated Creatinine Clearance: 55.1 mL/min (by C-G  formula based on SCr of 0.54 mg/dL). Recent Labs  Lab 05/19/20 1630 05/19/20 2212 05/20/20 0312 05/20/20 1711 05/21/20 0322 05/23/20 1033 05/24/20 0225 05/25/20 0332 05/25/20 0404  WBC  --   --    < >  --    < > 21.6* 20.9* 17.6* 18.5*  LATICACIDVEN 1.8 2.9*  --  2.1*  --   --   --   --   --    < > = values in this interval not displayed.    Liver Function Tests: Recent Labs  Lab 05/19/20 1629 05/22/20 1115  AST 23 37  ALT 26 35  ALKPHOS 148* 139*  BILITOT 1.5* 0.7  PROT 6.8 5.4*  ALBUMIN 3.0* 2.0*    ABG    Component Value Date/Time   HCO3 23.6 05/19/2020 1706   TCO2 25 05/19/2020 1706   O2SAT 86.0 05/19/2020 1706     HbA1C: Hgb A1c MFr Bld  Date/Time Value Ref Range Status  05/19/2020 10:12 PM 10.3 (H) 4.8 - 5.6 % Final    Comment:    (NOTE) Pre diabetes:          5.7%-6.4%  Diabetes:              >6.4%  Glycemic control for   <7.0% adults with diabetes   06/25/2012 09:59 AM 6.5 (H) <5.7 % Final    Comment:    (NOTE)                                                                       According to the ADA Clinical Practice Recommendations for 2011, when HbA1c is used as a screening test:  >=6.5%   Diagnostic of Diabetes Mellitus           (if abnormal result is confirmed) 5.7-6.4%   Increased risk of developing Diabetes Mellitus References:Diagnosis and Classification of Diabetes Mellitus,Diabetes BZJI,9678,93(YBOFB 1):S62-S69 and Standards of Medical Care in         Diabetes - 2011,Diabetes Care,2011,34 (Suppl 1):S11-S61.    CBG: Recent Labs  Lab 05/24/20 1638 05/24/20 1926 05/24/20 2306 05/25/20 0336 05/25/20 0731  GLUCAP 107* 199* 261* 238* 242*     Critical care time:  30 minutes   Eulis Foster, MD  [place]  PGY-2 05/25/20

## 2020-05-25 NOTE — Procedures (Signed)
Extubation Procedure Note  Patient Details:   Name: Katherine [name] DOB: 14-Dec-1940 MRN: [id number]   Airway Documentation:    Vent end date: 05/25/20 Vent end time: 1240   Evaluation  O2 sats: stable throughout Complications: No apparent complications Patient did tolerate procedure well. Bilateral Breath Sounds: Diminished   No, Patient self-extubated placed on a NRB, MD called RT to moniotor  Gonzella Lex 05/25/2020, 12:50 PM

## 2020-05-25 NOTE — Progress Notes (Signed)
Blood product infiltrated into left arm. IV removed, will continue to monitor closely.

## 2020-05-26 DIAGNOSIS — K047 Periapical abscess without sinus: Secondary | ICD-10-CM

## 2020-05-26 LAB — CBC WITH DIFFERENTIAL/PLATELET
Abs Immature Granulocytes: 0.15 10*3/uL — ABNORMAL HIGH (ref 0.00–0.07)
Basophils Absolute: 0 10*3/uL (ref 0.0–0.1)
Basophils Relative: 0 %
Eosinophils Absolute: 0.3 10*3/uL (ref 0.0–0.5)
Eosinophils Relative: 2 %
HCT: 28.2 % — ABNORMAL LOW (ref 36.0–46.0)
Hemoglobin: 8.8 g/dL — ABNORMAL LOW (ref 12.0–15.0)
Immature Granulocytes: 1 %
Lymphocytes Relative: 19 %
Lymphs Abs: 2.9 10*3/uL (ref 0.7–4.0)
MCH: 28.6 pg (ref 26.0–34.0)
MCHC: 31.2 g/dL (ref 30.0–36.0)
MCV: 91.6 fL (ref 80.0–100.0)
Monocytes Absolute: 1.4 10*3/uL — ABNORMAL HIGH (ref 0.1–1.0)
Monocytes Relative: 9 %
Neutro Abs: 10.3 10*3/uL — ABNORMAL HIGH (ref 1.7–7.7)
Neutrophils Relative %: 69 %
Platelets: 414 10*3/uL — ABNORMAL HIGH (ref 150–400)
RBC: 3.08 MIL/uL — ABNORMAL LOW (ref 3.87–5.11)
RDW: 15.7 % — ABNORMAL HIGH (ref 11.5–15.5)
WBC: 15.1 10*3/uL — ABNORMAL HIGH (ref 4.0–10.5)
nRBC: 0 % (ref 0.0–0.2)

## 2020-05-26 LAB — BASIC METABOLIC PANEL
Anion gap: 7 (ref 5–15)
BUN: 11 mg/dL (ref 8–23)
CO2: 33 mmol/L — ABNORMAL HIGH (ref 22–32)
Calcium: 8.7 mg/dL — ABNORMAL LOW (ref 8.9–10.3)
Chloride: 102 mmol/L (ref 98–111)
Creatinine, Ser: 0.45 mg/dL (ref 0.44–1.00)
GFR calc Af Amer: >60 mL/min (ref >60)
GFR calc non Af Amer: >60 mL/min (ref >60)
Glucose, Bld: 142 mg/dL — ABNORMAL HIGH (ref 70–99)
Potassium: 4.4 mmol/L (ref 3.5–5.1)
Sodium: 142 mmol/L (ref 135–145)

## 2020-05-26 LAB — GLUCOSE, CAPILLARY
Glucose-Capillary: 143 mg/dL — ABNORMAL HIGH (ref 70–99)
Glucose-Capillary: 144 mg/dL — ABNORMAL HIGH (ref 70–99)
Glucose-Capillary: 167 mg/dL — ABNORMAL HIGH (ref 70–99)
Glucose-Capillary: 200 mg/dL — ABNORMAL HIGH (ref 70–99)
Glucose-Capillary: 218 mg/dL — ABNORMAL HIGH (ref 70–99)
Glucose-Capillary: 255 mg/dL — ABNORMAL HIGH (ref 70–99)

## 2020-05-26 LAB — TYPE AND SCREEN
ABO/RH(D): O POS
Antibody Screen: NEGATIVE
Unit division: 0
Unit division: 0
Unit division: 0

## 2020-05-26 LAB — BPAM RBC
Blood Product Expiration Date: 202108082359
Blood Product Expiration Date: 202108122359
Blood Product Expiration Date: 202108122359
ISSUE DATE / TIME: 202107130948
ISSUE DATE / TIME: 202107131810
ISSUE DATE / TIME: 202107132114
Unit Type and Rh: 5100
Unit Type and Rh: 5100
Unit Type and Rh: 5100

## 2020-05-26 LAB — MAGNESIUM: Magnesium: 1.8 mg/dL (ref 1.7–2.4)

## 2020-05-26 MED ORDER — FUROSEMIDE 10 MG/ML IJ SOLN
40.0000 mg | Freq: Once | INTRAMUSCULAR | Status: AC
  Administered 2020-05-26: 40 mg via INTRAVENOUS
  Filled 2020-05-26: qty 4

## 2020-05-26 NOTE — Progress Notes (Signed)
NAME:  Katherine [name], MRN:  [id number], DOB:  [birth year], LOS: 7 ADMISSION DATE:  05/19/2020, CONSULTATION DATE:  05/20/20 REFERRING MD:  Dr. Ronnald Nian, CHIEF COMPLAINT: facial swelling and pain   Brief History   79 year old female, past medical history hypertension hyperlipidemia, type 2 diabetes.  Patient recently underwent  tooth extraction and later presented with lower facial pain concerning for ludwigs angina followed by hypotension and airway compromise for which patient was ventilated. Patient is now s/p I&D , no longer on vasopressors. Patient tolerating supplemental oxygen via [place].    Past Medical History   has a past medical history of Essential tremor (09/25/2019), Exotropia of right eye, Glaucoma, Hyperlipidemia, Hypertension, Incomplete right bundle branch block, LAFB (left anterior fascicular block), SUI (stress urinary incontinence, female), Type 2 diabetes mellitus ([place]), Wears dentures, and Wears glasses.  Significant Hospital Events   7/7: ETT antibiotic started culture sent 7/8 went to the operating room where she underwent incision and drainage of neck abscess, with Penrose drain placed cultures were sent 7/9: Pressors weaned off.  Supported on mechanical ventilation, extubation deferred due to upper airway swelling 7/10: Dressing changed.  Remarkable decrease in swelling but still with significant trismus raising concern for adequate airway protection.  Unasyn changed to ceftriaxone based on sensitivity data 7/11: Improving submandibular edema.  No other issues overnight with the exception of ongoing poorly controlled hyperglycemia, and difficult IV access.  Glycemic control regimen adjusted, PICC ordered 7/13: self extubated, tolerating [place], received units of pRBCs  Consults:  CCM  ENT Anesthesia   Procedures:  Endotracheal intubation   Significant Diagnostic Tests:  CT Soft Tissue and Neck:1. Trans-spatial gas and fluid containing abscess of the floor of mouth,  measuring 4.5 x 4.7 cm.2. Small subperiosteal abscess along the internal surface of the right mandibular body, near an empty posterior molar socket.  Micro Data:  Blood Cultures No Growth 7/7>>> Surgical culture 7/8: few strep constellatus, resistant to erythromycin   Antimicrobials:  Unasyn 1.5g 7/7>> 7/10 Ceftriaxone 7/10>>>  Interim history/subjective:  Patient self extubated overnight. Tolerating 1 L on [place] now, with sats 99%. Had   Objective   Blood pressure (!) 142/95, pulse 94, temperature 99.2 F (37.3 C), temperature source Oral, resp. rate (!) 29, height 5\' 6"  (1.676 m), weight 64.3 kg, SpO2 94 %.    Vent Mode: PRVC FiO2 (%):  [30 %-100 %] 100 % Set Rate:  [16 bmp] 16 bmp Vt Set:  [470 mL] 470 mL PEEP:  [5 cmH20] 5 cmH20 Plateau Pressure:  [16 cmH20] 16 cmH20   Intake/Output Summary (Last 24 hours) at 05/26/2020 0936 Last data filed at 05/26/2020 0900 Gross per 24 hour  Intake 3749.05 ml  Output 1405 ml  Net 2344.05 ml   Filed Weights   05/23/20 0402 05/24/20 0409 05/25/20 0340  Weight: 65 kg 67.6 kg 64.3 kg   Examination: General: calm appearing female, sitting upright in bed with [place] in place  HEENT: MMM, just oral thrush improved  Cardiac: RRR without murmur Abdomen: soft, non tender with bowel sounds present throughout  GU: light yellow urine  Neuro: alert and oriented, appropriate responses to questions  Extremities: bilateral hand edema, L>R   Resolved Hospital Problem list   Nonanion gap metabolic acidosis Oliguria  Assessment & Plan:   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  - Antibiotic treatment with CTX day #5/14, reassess for continued  therapy on 06/04/20 - Continue routine dressing change  - Penrose drain management per ENT  - Continuing mechanical ventilation - VAP bundle  - PAD protocol with RASS goal -2  -  discontinue precedex   Hypotension, improved BP have been WNL with some hypertensive systolic measurements  - discontinue vasopressors  - Holding home lisinopril   LE Edema  IV Lasix 40mg  once    T2DM  CBGs ranged from 97 - 144 - continue  levemir 20 units BID  - CBG monitoring  - resistant sliding scale insulin  - will titrate insulin based on diet changes   HLD Crestor via tube  Nutrition  CorTrak with tube feeds. Now extubated. Patient having pain with swallowing secretions, so will hold off on conducting SLP evaluation.  - will order SLP evaluation for 7/15 for diet recommendations.  Anemia  Hgb improved to 8.8 s/p 3 units pRBCs on 7/13.  - monitor CBC for Hgb   Best practice:  Diet: NPO, tube feeds Pain/Anxiety/Delirium protocol (if indicated): Fentanyl, Oxycodone VAP protocol (if indicated): per protocol  DVT prophylaxis: Heparin  GI prophylaxis: Protonix  Glucose control: CBG monitoring and subcutaneous insulin  Mobility: bedrest  Code Status: Full code  Family Communication: patient's spouse updated via telephone 7/14 Disposition: ICU pending ENT evaluation, will likely transfer out of ICU on 7/15 if continues to maintain airway and oxygen saturations overnight   Labs   CBC: Recent Labs  Lab 05/23/20 1033 05/23/20 1033 05/24/20 0225 05/25/20 0332 05/25/20 0404 05/25/20 1631 05/26/20 0146  WBC 21.6*  --  20.9* 17.6* 18.5*  --  15.1*  NEUTROABS 16.0*  --  15.1* 12.9* 13.7*  --  10.3*  HGB 7.3*   < > 7.1* 5.6* 6.0* 6.2* 8.8*  HCT 23.9*   < > 24.4* 18.5* 20.3* 21.1* 28.2*  MCV 94.1  --  98.0 94.9 94.9  --  91.6  PLT 362  --  364 349 410*  --  414*   < > = values in this interval not displayed.    Basic Metabolic Panel: Recent Labs  Lab 05/21/20 0322 05/21/20 1412 05/21/20 1412 05/21/20 1722 05/22/20 1115 05/22/20 1709 05/23/20 1033 05/24/20 0225 05/25/20 0332 05/26/20 0146  NA   < >  --   --   --  141  --  145 144 143 142  K   < >  --   --    --  3.5  --  4.0 4.3 3.9 4.4  CL   < >  --   --   --  111  --  113* 113* 109 102  CO2   < >  --   --   --  22  --  23 22 28  33*  GLUCOSE   < >  --   --   --  243*  --  252* 236* 279* 142*  BUN   < >  --   --   --  15  --  16 11 11 11   CREATININE   < >  --   --   --  0.72  --  0.64 0.55 0.54 0.45  CALCIUM   < >  --   --   --  8.5*  --  8.6* 8.5* 8.1* 8.7*  MG   < > 1.1*   < > 1.1* 1.7 2.2 1.8  --  1.4* 1.8  PHOS  --  1.0*  --  <1.0* 2.6 2.8 3.6  --   --   --    < > =  values in this interval not displayed.   GFR: Estimated Creatinine Clearance: 55.1 mL/min (by C-G formula based on SCr of 0.45 mg/dL). Recent Labs  Lab 05/19/20 1630 05/19/20 2212 05/20/20 0312 05/20/20 1711 05/21/20 0322 05/24/20 0225 05/25/20 0332 05/25/20 0404 05/26/20 0146  WBC  --   --    < >  --    < > 20.9* 17.6* 18.5* 15.1*  LATICACIDVEN 1.8 2.9*  --  2.1*  --   --   --   --   --    < > = values in this interval not displayed.    Liver Function Tests: Recent Labs  Lab 05/19/20 1629 05/22/20 1115  AST 23 37  ALT 26 35  ALKPHOS 148* 139*  BILITOT 1.5* 0.7  PROT 6.8 5.4*  ALBUMIN 3.0* 2.0*    ABG    Component Value Date/Time   HCO3 23.6 05/19/2020 1706   TCO2 25 05/19/2020 1706   O2SAT 86.0 05/19/2020 1706     HbA1C: Hgb A1c MFr Bld  Date/Time Value Ref Range Status  05/19/2020 10:12 PM 10.3 (H) 4.8 - 5.6 % Final    Comment:    (NOTE) Pre diabetes:          5.7%-6.4%  Diabetes:              >6.4%  Glycemic control for   <7.0% adults with diabetes   06/25/2012 09:59 AM 6.5 (H) <5.7 % Final    Comment:    (NOTE)                                                                       According to the ADA Clinical Practice Recommendations for 2011, when HbA1c is used as a screening test:  >=6.5%   Diagnostic of Diabetes Mellitus           (if abnormal result is confirmed) 5.7-6.4%   Increased risk of developing Diabetes Mellitus References:Diagnosis and Classification of Diabetes  [place],Diabetes SFKC,1275,17(GYFVC 1):S62-S69 and Standards of Medical Care in         Diabetes - 2011,Diabetes Care,2011,34 (Suppl 1):S11-S61.    CBG: Recent Labs  Lab 05/25/20 1627 05/25/20 1908 05/25/20 2344 05/26/20 0301 05/26/20 0751  GLUCAP 181* 211* 97 144* 167*     Critical care time:  30 minutes   Eulis Foster, MD  [place]   PGY-2 05/26/20

## 2020-05-26 NOTE — Progress Notes (Signed)
   ENT Progress Note: POD #6 s/p Procedure(s): INCISION AND DRAINAGE OF NECK ABSCESS   Subjective: Patient extubated, complaining of sore throat and difficulty swallowing  Objective: Vital signs in last 24 hours: Temp:  [98.4 F (36.9 C)-100 F (37.8 C)] 99.5 F (37.5 C) (07/14 1113) Pulse Rate:  [70-116] 94 (07/14 1100) Resp:  [16-29] 20 (07/14 1100) BP: (113-157)/(35-95) 157/56 (07/14 1100) SpO2:  [92 %-100 %] 98 % (07/14 1100) FiO2 (%):  [30 %-100 %] 100 % (07/13 1250) Weight change:  Last BM Date: 05/25/20  Intake/Output from previous day: 07/13 0701 - 07/14 0700 In: 3839.4 [I.V.:686; Blood:1503.3; NG/GT:1500; IV Piggyback:150] Out: 1275 [Urine:1275] Intake/Output this shift: Total I/O In: 262.3 [I.V.:32.2; NG/GT:180; IV Piggyback:50.1] Out: 950 [Urine:950]  Labs: Recent Labs    05/25/20 0404 05/25/20 0404 05/25/20 1631 05/26/20 0146  WBC 18.5*  --   --  15.1*  HGB 6.0*   < > 6.2* 8.8*  HCT 20.3*   < > 21.1* 28.2*  PLT 410*  --   --  414*   < > = values in this interval not displayed.   Recent Labs    05/25/20 0332 05/26/20 0146  NA 143 142  K 3.9 4.4  CL 109 102  CO2 28 33*  GLUCOSE 279* 142*  BUN 11 11  CALCIUM 8.1* 8.7*    Studies/Results: No results found.   PHYSICAL EXAM: Significant improvement in submental swelling and erythema.  Continued purulent drainage.  Penrose drains advanced.  Trismus significantly improved, floor mouth swelling has resolved.   Assessment/Plan: Patient improving on current medical therapy.  She self extubated and her airway is stable.  Submental swelling and erythema have improved and I began advancing her drains.  Continue current medical therapy and close observation for airway issues.    Jerrell Belfast 05/26/2020, 11:18 AM

## 2020-05-27 LAB — CBC WITH DIFFERENTIAL/PLATELET
Abs Immature Granulocytes: 0.13 10*3/uL — ABNORMAL HIGH (ref 0.00–0.07)
Basophils Absolute: 0.1 10*3/uL (ref 0.0–0.1)
Basophils Relative: 0 %
Eosinophils Absolute: 0.2 10*3/uL (ref 0.0–0.5)
Eosinophils Relative: 1 %
HCT: 31.1 % — ABNORMAL LOW (ref 36.0–46.0)
Hemoglobin: 9.6 g/dL — ABNORMAL LOW (ref 12.0–15.0)
Immature Granulocytes: 1 %
Lymphocytes Relative: 16 %
Lymphs Abs: 2.3 10*3/uL (ref 0.7–4.0)
MCH: 28.7 pg (ref 26.0–34.0)
MCHC: 30.9 g/dL (ref 30.0–36.0)
MCV: 93.1 fL (ref 80.0–100.0)
Monocytes Absolute: 1.8 10*3/uL — ABNORMAL HIGH (ref 0.1–1.0)
Monocytes Relative: 13 %
Neutro Abs: 9.6 10*3/uL — ABNORMAL HIGH (ref 1.7–7.7)
Neutrophils Relative %: 69 %
Platelets: 523 10*3/uL — ABNORMAL HIGH (ref 150–400)
RBC: 3.34 MIL/uL — ABNORMAL LOW (ref 3.87–5.11)
RDW: 15 % (ref 11.5–15.5)
WBC: 14.1 10*3/uL — ABNORMAL HIGH (ref 4.0–10.5)
nRBC: 0 % (ref 0.0–0.2)

## 2020-05-27 LAB — BASIC METABOLIC PANEL
Anion gap: 9 (ref 5–15)
BUN: 11 mg/dL (ref 8–23)
CO2: 35 mmol/L — ABNORMAL HIGH (ref 22–32)
Calcium: 8.7 mg/dL — ABNORMAL LOW (ref 8.9–10.3)
Chloride: 94 mmol/L — ABNORMAL LOW (ref 98–111)
Creatinine, Ser: 0.56 mg/dL (ref 0.44–1.00)
GFR calc Af Amer: >60 mL/min (ref >60)
GFR calc non Af Amer: >60 mL/min (ref >60)
Glucose, Bld: 182 mg/dL — ABNORMAL HIGH (ref 70–99)
Potassium: 4.1 mmol/L (ref 3.5–5.1)
Sodium: 138 mmol/L (ref 135–145)

## 2020-05-27 LAB — GLUCOSE, CAPILLARY
Glucose-Capillary: 123 mg/dL — ABNORMAL HIGH (ref 70–99)
Glucose-Capillary: 142 mg/dL — ABNORMAL HIGH (ref 70–99)
Glucose-Capillary: 168 mg/dL — ABNORMAL HIGH (ref 70–99)
Glucose-Capillary: 186 mg/dL — ABNORMAL HIGH (ref 70–99)
Glucose-Capillary: 217 mg/dL — ABNORMAL HIGH (ref 70–99)
Glucose-Capillary: 75 mg/dL (ref 70–99)

## 2020-05-27 LAB — MAGNESIUM: Magnesium: 1.9 mg/dL (ref 1.7–2.4)

## 2020-05-27 MED ORDER — CHLORHEXIDINE GLUCONATE CLOTH 2 % EX PADS
6.0000 | MEDICATED_PAD | Freq: Every day | CUTANEOUS | Status: DC
  Administered 2020-05-28 – 2020-06-05 (×9): 6 via TOPICAL

## 2020-05-27 MED ORDER — LISINOPRIL 10 MG PO TABS: 5.0000 mg | ORAL_TABLET | Freq: Every day | ORAL | Status: DC

## 2020-05-27 NOTE — Evaluation (Addendum)
Clinical/Bedside Swallow Evaluation Patient Details  Name: Katherine [name] MRN: [id number] Date of Birth: 06-19-1941  Today's Date: 05/27/2020 Time: SLP Start Time (ACUTE ONLY): 1320 SLP Stop Time (ACUTE ONLY): 1345 SLP Time Calculation (min) (ACUTE ONLY): 25 min  Past Medical History:  Past Medical History:  Diagnosis Date   Essential tremor 09/25/2019   Exotropia of right eye    AND HYPERTROPIA   Glaucoma    Hyperlipidemia    Hypertension    Incomplete right bundle branch block    LAFB (left anterior fascicular block)    SUI (stress urinary incontinence, female)    Type 2 diabetes mellitus ([facility])    Wears dentures    upper denture and lower partial denture   Wears glasses    Past Surgical History:  Past Surgical History:  Procedure Laterality Date   ADJUSTABLE SUTURE MANIPULATION Right 03/29/2016   Procedure: ADJUSTABLE SUTURE RIGHT EYE LATERAL RECTUS RESECTION RIGHT EYE ;  Surgeon: Aura Camps, MD;  [facility];  Service: Ophthalmology;  Laterality: Right;   CHOLECYSTECTOMY OPEN  1968   EYE SURGERY  age 74   repair right eye injury   GLAUCOMA SURGERY Right 08-23-2015  at [facility] AND DRAINAGE ABSCESS N/A 05/20/2020   Procedure: INCISION AND DRAINAGE OF NECK ABSCESS;  Surgeon: Serena Colonel, MD;  Location: [facility] OR;  Service: ENT;  Laterality: N/A;   INTUBATION-ENDOTRACHEAL WITH TRACHEOSTOMY STANDBY N/A 05/19/2020   Procedure: Alcario Drought WITH TRACHEOSTOMY STANDBY;  Surgeon: Osborn Coho, MD;  Location: [facility] OR;  Service: ENT;  Laterality: N/A;   MEDIAN RECTUS REPAIR Right 03/29/2016   Procedure: MEDIAN RECTUS RESECTION RIGHT EYE ADJUSTABLE SUTURE RIGHT EYE LATERAL RECTUS RESECTION RIGHT EYE ;  Surgeon: Aura Camps, MD;  Location: [facility];  Service: Ophthalmology;  Laterality: Right;   HPI:  79yo female admitted 05/19/20 with facial swelling and pain. PMH: HTN, HLD, DM2, recent tooth extraction  followed by upper airway obstruction from submandibular abscess. Pt was intubated 7/7 - 13/21, with self extubation   Assessment / Plan / Recommendation Clinical Impression  Pt seen at bedside for clinical swallow evaluation s/p self extubation after 6 days on ventilator. CN exam adequate but generally weak. Voice quality clear but very low in intensity. Volitional cough surprisingly strong. Oral care was completed with suction - lingual thrush noted (RN reports pt has magic mouthwash). Pt is edentulous. RN reports pt has been suctioning most of her oral secretions. Unable to palpate laryngeal elevation due to bandages and swelling. Extended oral prep noted with ice chips and thin liquid trials. Multiple swallows for each bolus, raising concern for post-swallow residue. No overt s/s aspiration observed following ice chips or sips of thin liquid via straw. Pt was unable to demonstrate consecutive boluses. Pt was encouraged to swallow oral secretions rather than suctioning them, as secretion management is necessary prior to liberalizing po intake. Will continue to follow to assess readiness for MBS and/or PO intake. RN and MD informed.   SLP Visit Diagnosis: Dysphagia, unspecified (R13.10)    Aspiration Risk  Mild aspiration risk;Moderate aspiration risk    Diet Recommendation NPO   Medication Administration: Via alternative means    Other  Recommendations Oral Care Recommendations: Oral care QID;Oral care prior to ice chip/H20;Staff/trained caregiver to provide oral care Other Recommendations: Have oral suction available   Follow up Recommendations Other (comment) (TBD)      Frequency and Duration min 2x/week  2 weeks;1 week  Prognosis Prognosis for Safe Diet Advancement: Good      Swallow Study   General Date of Onset: 05/19/20 HPI: 79yo female admitted 05/19/20 with facial swelling and pain. PMH: HTN, HLD, DM2, recent tooth extraction followed by upper airway obstruction from  submandibular abscess. Pt was intubated 7/7 - 13/21, with self extubation Type of Study: Bedside Swallow Evaluation Previous Swallow Assessment: none Diet Prior to this Study: NPO;NG Tube Temperature Spikes Noted:  (100) Respiratory Status: Room air History of Recent Intubation: Yes Length of Intubations (days): 6 days Date extubated: 05/25/20 Behavior/Cognition: Alert;Cooperative;Requires cueing Oral Cavity Assessment: Other (comment) (lingual thrush) Oral Care Completed by SLP: Yes Oral Cavity - Dentition: Edentulous Vision: Functional for self-feeding Self-Feeding Abilities: Able to feed self Patient Positioning: Upright in bed Baseline Vocal Quality: Low vocal intensity Volitional Cough: Strong Volitional Swallow: Able to elicit    Oral/Motor/Sensory Function Overall Oral Motor/Sensory Function: Generalized oral weakness   Ice Chips Ice chips: Impaired Presentation: Spoon Oral Phase Impairments: Reduced lingual movement/coordination Oral Phase Functional Implications: Prolonged oral transit Pharyngeal Phase Impairments: Multiple swallows;Suspected delayed Swallow   Thin Liquid Thin Liquid: Impaired Presentation: Straw Oral Phase Functional Implications: Prolonged oral transit Pharyngeal  Phase Impairments: Multiple swallows    Nectar Thick     Honey Thick     Puree     Solid           Ashleah Valtierra B. Murvin Natal, [facility], CCC-SLP Speech Language Pathologist Office: [phone]  Leigh Aurora 05/27/2020,2:02 PM

## 2020-05-27 NOTE — Progress Notes (Signed)
Nutrition Follow-up  DOCUMENTATION CODES:   Not applicable  INTERVENTION:   Tube Feeding via Cortrak:  Osmolite 1.2 at 60 ml/hr Provides 80 g of protein, 1728 kcals and 1166 mL of free water Meets 100% estimated calorie and protein needs   NUTRITION DIAGNOSIS:   Inadequate oral intake related to acute illness, inability to eat as evidenced by NPO status.  Being addressed via TF, plan for diet advanacement  GOAL:   Patient will meet greater than or equal to 90% of their needs  Met  MONITOR:   Vent status, Labs, Weight trends, TF tolerance, Diet advancement  REASON FOR ASSESSMENT:   Ventilator    ASSESSMENT:   79 yo female admitted with acute respiratory failure requiring intubation due to upper airway obstruction from submandibular abscess. PMH includes HTN, HLD, DM, recent tooth extraction  7/07 Intubated 7/08 I&D neck abscess 7/09 Cortrak placed, TF initiated 7/12 Cortrak tube replaced 7/14 Self-Extubated  NPO, SLP evaluated today and pt to remain NPO for now Pt is edentulous  Tolerating Osmolite 1.2 at 60 ml/hr via Cortrak tube  +thrush, on Magic Moutwash  Current weight 60.7 kg; admit weight 52.2 kg. Net + 13 L per I/O flow sheet  Labs: reviewed Meds: ss novolog    Diet Order:   Diet Order            Diet NPO time specified  Diet effective now                 EDUCATION NEEDS:   Not appropriate for education at this time  Skin:  Skin Assessment: Reviewed RN Assessment  Last BM:  7/14  Height:   Ht Readings from Last 1 Encounters:  05/19/20 5' 6" (1.676 m)    Weight:   Wt Readings from Last 1 Encounters:  05/27/20 60.7 kg   BMI:  Body mass index is 21.6 kg/m.  Estimated Nutritional Needs:   Kcal:  1525-1830 (kcals/kg), 1630 (PSU)  Protein:  75-90 g  Fluid:  >/= 1.5 L   Kerman Passey MS, RDN, LDN, CNSC Registered Dietitian III Clinical Nutrition RD Pager and On-Call Pager Number Located in [place]

## 2020-05-27 NOTE — Progress Notes (Addendum)
No dressing change orders.  Per CCM notes 7/14, 'continue routine dressing change', w/ Penrose mgmt per ENT. Per ENT note 7/14 "began advancing her drains"  Called ENT [place] [facility] @ [phone] to ask about dressing changes with no results.  Located upper and lower dentures; placed them in a denture cup with cleanser by the sink.

## 2020-05-27 NOTE — Progress Notes (Addendum)
NAME:  Katherine [name], MRN:  [id number], DOB:  1941/04/18, LOS: 8 ADMISSION DATE:  05/19/2020, CONSULTATION DATE:  05/20/20 REFERRING MD:  Dr. Ronnald Nian, CHIEF COMPLAINT: facial swelling and pain   Brief History   79 year old female, past medical history hypertension hyperlipidemia, type 2 diabetes.  Patient recently underwent  tooth extraction and later presented with lower facial pain concerning for ludwigs angina followed by hypotension and airway compromise for which patient was ventilated. Patient is now s/p I&D , no longer on vasopressors. Patient tolerating supplemental oxygen via [place].    Past Medical History   has a past medical history of Essential tremor (09/25/2019), Exotropia of right eye, Glaucoma, Hyperlipidemia, Hypertension, Incomplete right bundle branch block, LAFB (left anterior fascicular block), SUI (stress urinary incontinence, female), Type 2 diabetes mellitus ([place]), Wears dentures, and Wears glasses.  Significant Hospital Events   7/7: ETT antibiotic started culture sent 7/8 went to the operating room where she underwent incision and drainage of neck abscess, with Penrose drain placed cultures were sent 7/9: Pressors weaned off.  Supported on mechanical ventilation, extubation deferred due to upper airway swelling 7/10: Dressing changed.  Remarkable decrease in swelling but still with significant trismus raising concern for adequate airway protection.  Unasyn changed to ceftriaxone based on sensitivity data 7/11: Improving submandibular edema.  No other issues overnight with the exception of ongoing poorly controlled hyperglycemia, and difficult IV access.  Glycemic control regimen adjusted, PICC ordered 7/13: self extubated, tolerating [place], received units of pRBCs  Consults:  CCM  ENT Anesthesia   Procedures:  Endotracheal intubation   Significant Diagnostic Tests:  CT Soft Tissue and Neck:1. Trans-spatial gas and fluid containing abscess of the floor of mouth,  measuring 4.5 x 4.7 cm.2. Small subperiosteal abscess along the internal surface of the right mandibular body, near an empty posterior molar socket.  Micro Data:  Blood Cultures No Growth 7/7>>> Surgical culture 7/8: few strep constellatus, resistant to erythromycin   Antimicrobials:  Unasyn 1.5g 7/7>> 7/10 Ceftriaxone 7/10>>>  Interim history/subjective:  Patient self extubated overnight. Tolerating 1 L on [place] now, with sats 99%. Had   Objective   Blood pressure (!) 157/68, pulse (!) 102, temperature 100 F (37.8 C), temperature source Oral, resp. rate (!) 24, height 5\' 6"  (1.676 m), weight 60.7 kg, SpO2 98 %.    FiO2 (%):  [100 %] 100 %   Intake/Output Summary (Last 24 hours) at 05/27/2020 1050 Last data filed at 05/27/2020 0700 Gross per 24 hour  Intake 978.73 ml  Output 3675 ml  Net -2696.27 ml   Filed Weights   05/24/20 0409 05/25/20 0340 05/27/20 0500  Weight: 67.6 kg 64.3 kg 60.7 kg   Examination: General: female appearing stated age sitting upright in bed  HEENT: MMM, CorTrak tube in place  Cardiac: RRR without murmur  Abdomen: soft and no tenderness to palpation with bowel sounds present  GU: light golden colored urine  Neuro: alert and oriented, communicating clearly and demonstrating memories of visitor/providers  Extremities: decreased foot edema  Resolved Hospital Problem list   Nonanion gap metabolic acidosis Oliguria Hypotension   Assessment & Plan:   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  - Antibiotic treatment with CTX day #6/14, may need extended antibiotics past 14 days depending on clinical appearance of incision/drainage site  - Continue routine dressing change  - Penrose drain management per ENT, planning to  remove drain on 7/16  - Continuing mechanical ventilation - VAP bundle  - PAD protocol with RASS goal -2    - patient can transition to oxycodone 5 mg q6 hrs PRN  - per Dr. Wilburn Cornelia, patient is cleared to advance diet as tolerated, will order SLP evaluation   Oral Thrush, improved  - magic mouthwash - plan to transition to PRN on 7/16   Hypotension, resolved  HTN  - restart home lisinopril   T2DM  CBGs ranged from 140-180s - continue levemir 20 units BID  - CBG monitoring  - resistant sliding scale insulin  - will titrate insulin based on diet changes   HLD Crestor via tube  Nutrition  CorTrak with tube feeds. Now extubated.  - will refer to ENT for recommendations on PO recommendations  Anemia  Hgb improved to 9.6 - monitor CBC for Hgb   Best practice:  Diet: NPO, tube feeds Pain/Anxiety/Delirium protocol (if indicated): Fentanyl, Oxycodone VAP protocol (if indicated): per protocol  DVT prophylaxis: Heparin  GI prophylaxis: Protonix  Glucose control: CBG monitoring and subcutaneous insulin  Mobility: bedrest  Code Status: Full code  Family Communication: patient's spouse updated via telephone 7/15  Disposition: transfer out of ICU on 7/15, to transfer to floor pending availability   Labs   CBC: Recent Labs  Lab 05/24/20 0225 05/24/20 0225 05/25/20 0332 05/25/20 0404 05/25/20 1631 05/26/20 0146 05/27/20 0440  WBC 20.9*  --  17.6* 18.5*  --  15.1* 14.1*  NEUTROABS 15.1*  --  12.9* 13.7*  --  10.3* 9.6*  HGB 7.1*   < > 5.6* 6.0* 6.2* 8.8* 9.6*  HCT 24.4*   < > 18.5* 20.3* 21.1* 28.2* 31.1*  MCV 98.0  --  94.9 94.9  --  91.6 93.1  PLT 364  --  349 410*  --  414* 523*   < > = values in this interval not displayed.    Basic Metabolic Panel: Recent Labs  Lab 05/21/20 0322 05/21/20 1412 05/21/20 1412 05/21/20 1722 05/22/20 1115 05/22/20 1115 05/22/20 1709 05/23/20 1033 05/24/20 0225 05/25/20 0332 05/26/20 0146 05/27/20 0440  NA   < >  --   --   --  141   < >  --  145 144 143 142 138  K   < >  --   --   --  3.5   < >  --  4.0 4.3 3.9 4.4 4.1  CL   <  >  --   --   --  111   < >  --  113* 113* 109 102 94*  CO2   < >  --   --   --  22   < >  --  23 22 28  33* 35*  GLUCOSE   < >  --   --   --  243*   < >  --  252* 236* 279* 142* 182*  BUN   < >  --   --   --  15   < >  --  16 11 11 11 11   CREATININE   < >  --   --   --  0.72   < >  --  0.64 0.55 0.54 0.45 0.56  CALCIUM   < >  --   --   --  8.5*   < >  --  8.6* 8.5* 8.1* 8.7* 8.7*  MG   < > 1.1*   < > 1.1*  1.7   < > 2.2 1.8  --  1.4* 1.8 1.9  PHOS  --  1.0*  --  <1.0* 2.6  --  2.8 3.6  --   --   --   --    < > = values in this interval not displayed.   GFR: Estimated Creatinine Clearance: 55.1 mL/min (by C-G formula based on SCr of 0.56 mg/dL). Recent Labs  Lab 05/20/20 1711 05/21/20 0322 05/25/20 0332 05/25/20 0404 05/26/20 0146 05/27/20 0440  WBC  --    < > 17.6* 18.5* 15.1* 14.1*  LATICACIDVEN 2.1*  --   --   --   --   --    < > = values in this interval not displayed.    Liver Function Tests: Recent Labs  Lab 05/22/20 1115  AST 37  ALT 35  ALKPHOS 139*  BILITOT 0.7  PROT 5.4*  ALBUMIN 2.0*    ABG    Component Value Date/Time   HCO3 23.6 05/19/2020 1706   TCO2 25 05/19/2020 1706   O2SAT 86.0 05/19/2020 1706     HbA1C: Hgb A1c MFr Bld  Date/Time Value Ref Range Status  05/19/2020 10:12 PM 10.3 (H) 4.8 - 5.6 % Final    Comment:    (NOTE) Pre diabetes:          5.7%-6.4%  Diabetes:              >6.4%  Glycemic control for   <7.0% adults with diabetes   06/25/2012 09:59 AM 6.5 (H) <5.7 % Final    Comment:    (NOTE)                                                                       According to the ADA Clinical Practice Recommendations for 2011, when HbA1c is used as a screening test:  >=6.5%   Diagnostic of Diabetes Mellitus           (if abnormal result is confirmed) 5.7-6.4%   Increased risk of developing Diabetes Mellitus References:Diagnosis and Classification of Diabetes Mellitus,Diabetes RAQT,6226,33(HLKTG 1):S62-S69 and Standards of Medical  Care in         Diabetes - 2011,Diabetes Care,2011,34 (Suppl 1):S11-S61.    CBG: Recent Labs  Lab 05/26/20 1604 05/26/20 1929 05/26/20 2340 05/27/20 0352 05/27/20 0710  GLUCAP 218* 200* 255* 186* 142*     Critical care time:  30 minutes   Eulis Foster, MD  [place]  PGY-2 05/27/20

## 2020-05-27 NOTE — Progress Notes (Signed)
Inpatient Diabetes Program Recommendations  AACE/ADA: New Consensus Statement on Inpatient Glycemic Control (2015)  Target Ranges:  Prepandial:   less than 140 mg/dL      Peak postprandial:   less than 180 mg/dL (1-2 hours)      Critically ill patients:  140 - 180 mg/dL   Lab Results  Component Value Date   GLUCAP 217 (H) 05/27/2020   HGBA1C 10.3 (H) 05/19/2020    Review of Glycemic Control Results for Katherine [name], Katherine [name] (MRN [id number]) as of 05/27/2020 12:28  Ref. Range 05/26/2020 19:29 05/26/2020 23:40 05/27/2020 03:52 05/27/2020 07:10 05/27/2020 11:16  Glucose-Capillary Latest Ref Range: 70 - 99 mg/dL 200 (H) 255 (H) 186 (H) 142 (H) 217 (H)   Diabetes history: DM 2 Outpatient Diabetes medications:  Metformin- patient not taking Current orders for Inpatient glycemic control:  Novolog resistant q 4 hours Osmolite 60 ml/hr Inpatient Diabetes Program Recommendations:   Note Levemir stopped today.  SLP evaluation pending.   A1C indicates possible need for insulin at d/c.  Will follow and speak to patient on 7/16 regarding DM and potential outpatient options for management.   Thanks,  Adah Perl, RN, BC-ADM Inpatient Diabetes Coordinator Pager [phone] (8a-5p)

## 2020-05-28 DIAGNOSIS — D649 Anemia, unspecified: Secondary | ICD-10-CM

## 2020-05-28 DIAGNOSIS — L0291 Cutaneous abscess, unspecified: Secondary | ICD-10-CM

## 2020-05-28 LAB — CBC WITH DIFFERENTIAL/PLATELET
Abs Immature Granulocytes: 0.11 10*3/uL — ABNORMAL HIGH (ref 0.00–0.07)
Basophils Absolute: 0.1 10*3/uL (ref 0.0–0.1)
Basophils Relative: 0 %
Eosinophils Absolute: 0.1 10*3/uL (ref 0.0–0.5)
Eosinophils Relative: 1 %
HCT: 31.7 % — ABNORMAL LOW (ref 36.0–46.0)
Hemoglobin: 9.8 g/dL — ABNORMAL LOW (ref 12.0–15.0)
Immature Granulocytes: 1 %
Lymphocytes Relative: 17 %
Lymphs Abs: 2.3 10*3/uL (ref 0.7–4.0)
MCH: 28.8 pg (ref 26.0–34.0)
MCHC: 30.9 g/dL (ref 30.0–36.0)
MCV: 93.2 fL (ref 80.0–100.0)
Monocytes Absolute: 1.7 10*3/uL — ABNORMAL HIGH (ref 0.1–1.0)
Monocytes Relative: 13 %
Neutro Abs: 9.4 10*3/uL — ABNORMAL HIGH (ref 1.7–7.7)
Neutrophils Relative %: 68 %
Platelets: 581 10*3/uL — ABNORMAL HIGH (ref 150–400)
RBC: 3.4 MIL/uL — ABNORMAL LOW (ref 3.87–5.11)
RDW: 14.9 % (ref 11.5–15.5)
WBC: 13.7 10*3/uL — ABNORMAL HIGH (ref 4.0–10.5)
nRBC: 0 % (ref 0.0–0.2)

## 2020-05-28 LAB — MAGNESIUM: Magnesium: 1.8 mg/dL (ref 1.7–2.4)

## 2020-05-28 LAB — COMPREHENSIVE METABOLIC PANEL
ALT: 22 U/L (ref 0–44)
AST: 23 U/L (ref 15–41)
Albumin: 2.3 g/dL — ABNORMAL LOW (ref 3.5–5.0)
Alkaline Phosphatase: 92 U/L (ref 38–126)
Anion gap: 8 (ref 5–15)
BUN: 12 mg/dL (ref 8–23)
CO2: 28 mmol/L (ref 22–32)
Calcium: 8.8 mg/dL — ABNORMAL LOW (ref 8.9–10.3)
Chloride: 101 mmol/L (ref 98–111)
Creatinine, Ser: 0.65 mg/dL (ref 0.44–1.00)
GFR calc Af Amer: >60 mL/min (ref >60)
GFR calc non Af Amer: >60 mL/min (ref >60)
Glucose, Bld: 290 mg/dL — ABNORMAL HIGH (ref 70–99)
Potassium: 4.1 mmol/L (ref 3.5–5.1)
Sodium: 137 mmol/L (ref 135–145)
Total Bilirubin: 0.6 mg/dL (ref 0.3–1.2)
Total Protein: 6.1 g/dL — ABNORMAL LOW (ref 6.5–8.1)

## 2020-05-28 LAB — PHOSPHORUS: Phosphorus: 2.3 mg/dL — ABNORMAL LOW (ref 2.5–4.6)

## 2020-05-28 LAB — GLUCOSE, CAPILLARY
Glucose-Capillary: 258 mg/dL — ABNORMAL HIGH (ref 70–99)
Glucose-Capillary: 259 mg/dL — ABNORMAL HIGH (ref 70–99)
Glucose-Capillary: 213 mg/dL — ABNORMAL HIGH (ref 70–99)
Glucose-Capillary: 229 mg/dL — ABNORMAL HIGH (ref 70–99)

## 2020-05-28 MED ORDER — POTASSIUM PHOSPHATES 15 MMOLE/5ML IV SOLN
20.0000 mmol | Freq: Once | INTRAVENOUS | Status: AC
  Administered 2020-05-28: 20 mmol via INTRAVENOUS
  Filled 2020-05-28: qty 6.67

## 2020-05-28 NOTE — Progress Notes (Signed)
Report given to rec'g RN on [facility] to transfer to room 6N10. Patient transferred by RN, and PCT-with cardiac monitor, BP, pulse OX, tube feeding. Dentures accompanied patient on bed, glasses were on patient during transfer. Patient had moderate sized BM just prior to transfer.

## 2020-05-28 NOTE — Progress Notes (Signed)
  Speech Language Pathology Treatment: Dysphagia  Patient Details Name: Katherine [name] MRN: [id number] DOB: [birth year] Today's Date: 05/28/2020 Time: 8177-1165 SLP Time Calculation (min) (ACUTE ONLY): 30 min  Assessment / Plan / Recommendation Clinical Impression  Pt was seen for skilled ST intervention targeting goal for PO readiness. Pt exhibits some improvement today, with slightly stronger voice quality and continued strong cough. Pt is no longer suctioning all oral secretions - only that which she coughs up. Oral care was completed with suction, which pt tolerated well. Following oral care, pt accepted trials of ice chips, water, and jello. Multiple swallows continue to be demonstrated, likely due to swelling from surgery site. Puree textures not attempted at this time due to increased aspiration risk. No overt s/s aspiration observed after po trials, despite multiple presentations. Recommend beginning clear liquids as endurance allows, with continued Cortrak feeds for primary nutrition, hydration, and medication. SLP will continue to follow for readiness to advance diet or complete instrumental study if needed. Safe swallow precautions posted at [facility]. RN, RD, and MDs aware.    HPI HPI: 79yo female admitted 05/19/20 with facial swelling and pain. PMH: HTN, HLD, DM2, recent tooth extraction followed by upper airway obstruction from submandibular abscess. Pt was intubated 7/7 - 13/21, with self extubation      SLP Plan  Continue with current plan of care       Recommendations  Diet recommendations: Thin liquid;Other(comment) (clear liquid, Cortrak for primary nutrition, hydration, meds) Liquids provided via: Teaspoon;Straw Medication Administration: Via alternative means Supervision: Full supervision/cueing for compensatory strategies;Trained caregiver to feed patient Compensations: Slow rate;Small sips/bites Postural Changes and/or Swallow Maneuvers: Seated upright 90 degrees;Upright 30-60  min after meal                Oral Care Recommendations: Oral care QID;Oral care prior to ice chip/H20;Staff/trained caregiver to provide oral care Follow up Recommendations: Other (comment) (TBD) SLP Visit Diagnosis: Dysphagia, unspecified (R13.10) Plan: Continue with current plan of care       [place]. Quentin Ore, [facility], [place] Speech Language Pathologist Office: [phone] Pager: [phone]  Shonna Chock 05/28/2020, 9:11 AM

## 2020-05-28 NOTE — Progress Notes (Signed)
PROGRESS NOTE    Katherine [name]  ZDG:[id number] DOB: 26-Aug-1941 DOA: 05/19/2020 PCP: Lucianne Lei, MD   Brief Narrative:  79 year old African-American female with a past medical history significant for but not limited to hypertension, hyperlipidemia, type 2 diabetes mellitus, essential tremor, exotropia of right eye, glaucoma, incomplete right bundle branch block, left anterior fascicular block, stress urinary incontinence, as well as other comorbidities who recently underwent a tooth extraction and later presented with orofacial pain concerning for Ludwig's angina followed by hypotension and airway compromise.  She was intubated and underwent incision and drainage and had to be placed on pressors.  7/7 she was intubated and antibiotics were started and cultures were sent.  On 7/8 she went to the operating room where she went I&D of the neck abscess and had a Penrose drain placed with culture sent.  On 7/9 pressors were weaned off and she is supported on mechanical ventilation and extubation was deferred due to upper airway swelling.  7/10 her dressing was changed and there is markable decrease the swelling but was still significant trismus concern for adequate airway protection.  Unasyn was changed to IV ceftriaxone based on sensitivity data.  On 7/11 she had improving submandibular edema and there is no issues overnight except for poorly controlled hyperglycemia.  She had extreme difficulty getting IV access in her left arm and her left arm stuck multiple times and cause swelling and bruising.  PICC line was ordered.  On 7/13 she self extubated and tolerated nasal cannula.  Because of her hemoglobin being low she was transfused 2 units of PRBCs.  On 05/26/2020 she was able to utilize suction to remove secretions.  She is remained on a little bit of Precedex and on 05/27/2020 she felt that she is able to swallow her secretions better and required no further transfusions.  WBC is improving on antibiotics and she  was transferred to [facility] service on 05/28/2020.  Today she underwent a SLP evaluation and SLP has cleared her for a clear liquid diet.  She continues to have a core track for feeding currently until she gets further strengthening.  PT OT will be consulted for further evaluation recommendations and ENT recommends continue IV antibiotics and current treatment and p.o. as tolerated.   Assessment & Plan:   Active Problems:   Ludwig's angina   Abscess   Respiratory failure (HCC)   Acute respiratory failure with hypoxia requiring intubation secondary to Ludwig angina and extubation on 713 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 Now Self extubated 7/13   -WBC down trending and for the last 3 days has gone from 15.1 and is now 13.7, swelling at site of incision continued to decrease.  - Antibiotic treatment with CTX day #7/14, may need extended antibiotics past 14 days depending on clinical appearance of incision/drainage site  - Continue routine dressing change  - Penrose drain management per ENT, planning to remove drain on 7/16  and ENT evaluated and recommends continuing current treatment antibiotics and p.o. as tolerated - patient can transition to oxycodone 5 mg q6 hrs PRN  - per Dr. Wilburn Cornelia, patient is cleared to advance diet as tolerated, will order SLP evaluation  and she is order for clears -SpO2: 99 % O2 Flow Rate (L/min): 2 L/min FiO2 (%): 100 % -Continue with speech therapy management and continue with core track for feeding for now -Will order PT/OT to further evaluate and treat given that she has not been out of bed for last 9 days  Oral Thrush, improved  - magic mouthwash - plan to transition to PRN on 7/16   Hypotension, resolved  HTN  -She is off the vasopressors - restart home lisinopril   T2DM  -CBGs ranged from  35-2 59 - continue levemir 20 units BID  - CBG monitoring closely and adjust insulin as necessary - resistant sliding  scale insulin every 4 but will change to before meals and at bedtime when she is eating - will titrate insulin based on diet changes   HLD Crestor via tube  Nutrition  -CorTrak with tube feeds with Osmolite 60 mL/h. Now extubated.  - will refer to ENT for recommendations on PO recommendations and she is on a clear liquid diet for now  Normocytic anemia/anemia of critical illness -Hemoglobin dropped down to 6.0 and hematocrit was 20.3 -She is status post 3 units of PRBCs and 1/hematocrit is now improved to 9.8/31.7 -Continue to monitor for signs and symptoms of bleeding; currently no bleeding noted -repeat CBC in a.m.  Hypophosphatemia -Patient's phosphorus level was 2.3  -replete with IV K-Phos 20 mmol -Continue to monitor and replete as necessary -Repeat phosphorus level in a.m.  Thrombocytosis -likely reactive -Patient's platelet count from 210,000 is now trended up to 581 -Plan continue monitor and trend repeat CBC level in a.m.  Left arm swelling and bruising -In the setting of multiple IVs infiltration of stick  -Continue with warm compresses and elevation- -if not improving will obtain a ultrasound to rule out pathology   DVT prophylaxis: SCDs given her recent acute anemia Code Status: FULL CODE Family Communication: No family present at bedside  Disposition Plan: Pending further improvement and clearance by ENT and evaluation by PT and OT  Status is: Inpatient  Remains inpatient appropriate because:Unsafe d/c plan, IV treatments appropriate due to intensity of illness or inability to take PO and Inpatient level of care appropriate due to severity of illness   Dispo: The patient is from: Home              Anticipated d/c is to: SNF              Anticipated d/c date is: 3 days              Patient currently is not medically stable to d/c.   Consultants:   ENT  PCCM Transfer   Procedures:  CT Soft Tissue and Neck:1. Trans-spatial gas and fluid  containing abscess of the floor of mouth, measuring 4.5 x 4.7 cm.2. Small subperiosteal abscess along the internal surface of the right mandibular body, near an empty posterior molar socket.  7/8 went to the operating room where she underwent incision and drainage of neck abscess, with Penrose drain placed cultures were sen  Antimicrobials:  Anti-infectives (From admission, onward)   Start     Dose/Rate Route Frequency Ordered Stop   05/22/20 1415  cefTRIAXone (ROCEPHIN) 2 g in sodium chloride 0.9 % 100 mL IVPB     Discontinue     2 g 200 mL/hr over 30 Minutes Intravenous Every 24 hours 05/22/20 1411     05/19/20 1700  ampicillin-sulbactam (UNASYN) 1.5 g in sodium chloride 0.9 % 100 mL IVPB  Status:  Discontinued        1.5 g 200 mL/hr over 30 Minutes Intravenous Every 6 hours 05/19/20 1647 05/22/20 1411     Subjective: Seen and examined at bedside and she is working with the speech therapist.  States that she is doing okay today  but complaining of left arm pain.  No nausea or vomiting.  States that she feels weak.  No chest pain.  Able to manage her secretions with the anchor.  No other concerns or complaints at this time.  Objective: Vitals:   05/28/20 0300 05/28/20 0351 05/28/20 1014 05/28/20 1450  BP: (!) 152/60 (!) 153/58 (!) 152/48 (!) 174/70  Pulse: (!) 101 92 (!) 104 96  Resp: (!) 29 18 20 20   Temp:  99.4 F (37.4 C) 99.9 F (37.7 C) 98.4 F (36.9 C)  TempSrc:  Oral Oral Oral  SpO2: 96% 99% 98% 99%  Weight:      Height:        Intake/Output Summary (Last 24 hours) at 05/28/2020 1814 Last data filed at 05/28/2020 1754 Gross per 24 hour  Intake 931.18 ml  Output 3100 ml  Net -2168.82 ml   Filed Weights   05/24/20 0409 05/25/20 0340 05/27/20 0500  Weight: 67.6 kg 64.3 kg 60.7 kg   Examination: Physical Exam:  Constitutional: Thin elderly chronically ill-appearing African-American female currently in NAD and appears calm and comfortable Eyes: Lids and  conjunctivae normal, sclerae anicteric; has exotropia of the right ENMT: External Ears, Nose appear normal. Grossly normal hearing.  Has a core track in place Neck: Neck is bandaged and has a drain in place. Respiratory: Diminished to auscultation bilaterally with coarse breath sounds, no wheezing, rales, rhonchi or crackles. Normal respiratory effort and patient is not tachypenic. No accessory muscle use.  Unlabored breathing but is wearing 2 L of supplemental oxygen via nasal cannula Cardiovascular: RRR, no murmurs / rubs / gallops. S1 and S2 auscultated.  Has some left arm upper extremity edema and bruising; has some pedal edema Abdomen: Soft, non-tender, non-distended.  Bowel sounds positive.  GU: Deferred. Musculoskeletal: No clubbing / cyanosis of digits/nails. No joint deformity upper and lower extremities but left arm is significantly swollen and bruised compared to right arm Skin: Has some bruising on the left arm. No induration; Warm and dry.  Neurologic: CN 2-12 grossly intact with no focal deficits. Romberg sign cerebellar reflexes not assessed.  Psychiatric: Normal judgment and insight. Alert and oriented x 3. Normal mood and appropriate affect.   Data Reviewed: I have personally reviewed following labs and imaging studies  CBC: Recent Labs  Lab 05/25/20 0332 05/25/20 0332 05/25/20 0404 05/25/20 1631 05/26/20 0146 05/27/20 0440 05/28/20 0242  WBC 17.6*  --  18.5*  --  15.1* 14.1* 13.7*  NEUTROABS 12.9*  --  13.7*  --  10.3* 9.6* 9.4*  HGB 5.6*   < > 6.0* 6.2* 8.8* 9.6* 9.8*  HCT 18.5*   < > 20.3* 21.1* 28.2* 31.1* 31.7*  MCV 94.9  --  94.9  --  91.6 93.1 93.2  PLT 349  --  410*  --  414* 523* 581*   < > = values in this interval not displayed.   Basic Metabolic Panel: Recent Labs  Lab 05/22/20 1115 05/22/20 1115 05/22/20 1709 05/23/20 1033 05/23/20 1033 05/24/20 0225 05/25/20 0332 05/26/20 0146 05/27/20 0440 05/28/20 0930  NA 141   < >  --  145   < > 144 143  142 138 137  K 3.5   < >  --  4.0   < > 4.3 3.9 4.4 4.1 4.1  CL 111   < >  --  113*   < > 113* 109 102 94* 101  CO2 22   < >  --  23   < >  22 28 33* 35* 28  GLUCOSE 243*   < >  --  252*   < > 236* 279* 142* 182* 290*  BUN 15   < >  --  16   < > 11 11 11 11 12   CREATININE 0.72   < >  --  0.64   < > 0.55 0.54 0.45 0.56 0.65  CALCIUM 8.5*   < >  --  8.6*   < > 8.5* 8.1* 8.7* 8.7* 8.8*  MG 1.7   < > 2.2 1.8  --   --  1.4* 1.8 1.9 1.8  PHOS 2.6  --  2.8 3.6  --   --   --   --   --  2.3*   < > = values in this interval not displayed.   GFR: Estimated Creatinine Clearance: 55.1 mL/min (by C-G formula based on SCr of 0.65 mg/dL). Liver Function Tests: Recent Labs  Lab 05/22/20 1115 05/28/20 0930  AST 37 23  ALT 35 22  ALKPHOS 139* 92  BILITOT 0.7 0.6  PROT 5.4* 6.1*  ALBUMIN 2.0* 2.3*   No results for input(s): LIPASE, AMYLASE in the last 168 hours. No results for input(s): AMMONIA in the last 168 hours. Coagulation Profile: No results for input(s): INR, PROTIME in the last 168 hours. Cardiac Enzymes: No results for input(s): CKTOTAL, CKMB, CKMBINDEX, TROPONINI in the last 168 hours. BNP (last 3 results) No results for input(s): PROBNP in the last 8760 hours. HbA1C: No results for input(s): HGBA1C in the last 72 hours. CBG: Recent Labs  Lab 05/27/20 1935 05/27/20 2332 05/28/20 0740 05/28/20 1153 05/28/20 1621  GLUCAP 75 168* 258* 259* 213*   Lipid Profile: No results for input(s): CHOL, HDL, LDLCALC, TRIG, CHOLHDL, LDLDIRECT in the last 72 hours. Thyroid Function Tests: No results for input(s): TSH, T4TOTAL, FREET4, T3FREE, THYROIDAB in the last 72 hours. Anemia Panel: No results for input(s): VITAMINB12, FOLATE, FERRITIN, TIBC, IRON, RETICCTPCT in the last 72 hours. Sepsis Labs: No results for input(s): PROCALCITON, LATICACIDVEN in the last 168 hours.  Recent Results (from the past 240 hour(s))  SARS Coronavirus 2 by RT PCR (hospital order, performed in [facility]  hospital lab) Nasopharyngeal Nasopharyngeal Swab     Status: None   Collection Time: 05/19/20  4:36 PM   Specimen: Nasopharyngeal Swab  Result Value Ref Range Status   SARS Coronavirus 2 NEGATIVE NEGATIVE Final    Comment: (NOTE) SARS-CoV-2 target nucleic acids are NOT DETECTED.  The SARS-CoV-2 RNA is generally detectable in upper and lower respiratory specimens during the acute phase of infection. The lowest concentration of SARS-CoV-2 viral copies this assay can detect is 250 copies / mL. A negative result does not preclude SARS-CoV-2 infection and should not be used as the sole basis for treatment or other patient management decisions.  A negative result may occur with improper specimen collection / handling, submission of specimen other than nasopharyngeal swab, presence of viral mutation(s) within the areas targeted by this assay, and inadequate number of viral copies (<250 copies / mL). A negative result must be combined with clinical observations, patient history, and epidemiological information.  Fact Sheet for Patients:   StrictlyIdeas.no  Fact Sheet for Healthcare Providers: BankingDealers.co.za  This test is not yet approved or  cleared by the [place] FDA and has been authorized for detection and/or diagnosis of SARS-CoV-2 by FDA under an Emergency Use Authorization (EUA).  This EUA will remain in effect (meaning this test can be  used) for the duration of the COVID-19 declaration under Section 564(b)(1) of the Act, 21 U.S.C. section 360bbb-3(b)(1), unless the authorization is terminated or revoked sooner.  Performed at [facility], [place] [address], [place], [place] [address]   Blood culture (routine x 2)     Status: None   Collection Time: 05/19/20  4:43 PM   Specimen: BLOOD  Result Value Ref Range Status   Specimen Description BLOOD LEFT ANTECUBITAL  Final   Special Requests   Final    BOTTLES DRAWN  AEROBIC AND ANAEROBIC Blood Culture adequate volume   Culture   Final    NO GROWTH 5 DAYS Performed at [facility], [place] [address] [place], [place] [address]    Report Status 05/24/2020 FINAL  Final  MRSA PCR Screening     Status: None   Collection Time: 05/19/20  7:58 PM   Specimen: Nasopharyngeal  Result Value Ref Range Status   MRSA by PCR NEGATIVE NEGATIVE Final    Comment:        The GeneXpert MRSA Assay (FDA approved for NASAL specimens only), is one component of a comprehensive MRSA colonization surveillance program. It is not intended to diagnose MRSA infection nor to guide or monitor treatment for MRSA infections. Performed at [facility], [place] [address], [place], [place] [address]   Blood culture (routine x 2)     Status: None   Collection Time: 05/19/20 10:12 PM   Specimen: BLOOD  Result Value Ref Range Status   Specimen Description BLOOD LEFT ARM  Final   Special Requests   Final    BOTTLES DRAWN AEROBIC ONLY Blood Culture adequate volume   Culture   Final    NO GROWTH 5 DAYS Performed at [facility], [place] [address], [place], [place] [address]    Report Status 05/24/2020 FINAL  Final  Surgical pcr screen     Status: None   Collection Time: 05/20/20 12:15 PM   Specimen: Nasal Mucosa; Nasal Swab  Result Value Ref Range Status   MRSA, PCR NEGATIVE NEGATIVE Final   Staphylococcus aureus NEGATIVE NEGATIVE Final    Comment: (NOTE) The Xpert SA Assay (FDA approved for NASAL specimens in patients 86 years of age and older), is one component of a comprehensive surveillance program. It is not intended to diagnose infection nor to guide or monitor treatment. Performed at [facility], [place] [address] [place], [place] [address]   Aerobic/Anaerobic Culture (surgical/deep wound)     Status: None   Collection Time: 05/20/20 12:57 PM   Specimen: Wound; Abscess  Result Value Ref Range Status   Specimen Description WOUND  Final   Special  Requests NECK ABSCESS  Final   Gram Stain   Final    NO WBC SEEN MODERATE GRAM POSITIVE COCCI IN PAIRS MODERATE GRAM VARIABLE ROD Performed at [facility], [place] [address] [place], [place] [address]    Culture   Final    FEW STREPTOCOCCUS CONSTELLATUS MIXED ANAEROBIC FLORA PRESENT.  CALL LAB IF FURTHER IID REQUIRED.    Report Status 05/25/2020 FINAL  Final   Organism ID, Bacteria STREPTOCOCCUS CONSTELLATUS  Final      Susceptibility   Streptococcus constellatus - MIC*    PENICILLIN INTERMEDIATE Intermediate     CEFTRIAXONE 1 SENSITIVE Sensitive     ERYTHROMYCIN <=0.12 SENSITIVE Sensitive     LEVOFLOXACIN <=0.25 SENSITIVE Sensitive     VANCOMYCIN 0.5 SENSITIVE Sensitive     *  FEW STREPTOCOCCUS CONSTELLATUS     RN Pressure Injury Documentation:     Estimated body mass index is 21.6 kg/m as calculated from the following:   Height as of this encounter: 5\' 6"  (1.676 m).   Weight as of this encounter: 60.7 kg.  Malnutrition Type:  Nutrition Problem: Inadequate oral intake Etiology: acute illness, inability to eat   Malnutrition Characteristics:  Signs/Symptoms: NPO status   Nutrition Interventions:  Interventions: Other (Comment)   Radiology Studies: No results found.  Scheduled Meds: . Chlorhexidine Gluconate Cloth  6 each Topical Daily  . insulin aspart  0-20 Units Subcutaneous Q4H  . magic mouthwash  5 mL Oral QID   Continuous Infusions: . cefTRIAXone (ROCEPHIN)  IV 2 g (05/28/20 1501)  . feeding supplement (OSMOLITE 1.2 CAL) 1,000 mL (05/28/20 1336)    LOS: 9 days   Kerney Elbe, DO [facility] PAGER is on [place]  If 7PM-7AM, please contact night-coverage [email or web address]

## 2020-05-28 NOTE — Progress Notes (Signed)
Patient received from 35M. Alert and orientedx4. Denies pain. Oriented to unit and procedures. SCD's on. VSS. Telemetry and continuous pulse oximetry. No acute needs voiced at this time, call bell in reach.

## 2020-05-28 NOTE — Progress Notes (Signed)
   ENT Progress Note: POD #8 s/p Procedure(s): INCISION AND DRAINAGE OF NECK ABSCESS   Subjective: Sig improvement in swelling  Objective: Vital signs in last 24 hours: Temp:  [98.7 F (37.1 C)-99.9 F (37.7 C)] 99.9 F (37.7 C) (07/16 1014) Pulse Rate:  [90-104] 104 (07/16 1014) Resp:  [18-31] 20 (07/16 1014) BP: (84-153)/(46-97) 152/48 (07/16 1014) SpO2:  [95 %-100 %] 98 % (07/16 1014) Weight change:  Last BM Date: 05/27/20  Intake/Output from previous day: 07/15 0701 - 07/16 0700 In: 930 [I.V.:60; NG/GT:720; IV Piggyback:150] Out: 2325 [Urine:2325] Intake/Output this shift: Total I/O In: -  Out: 450 [Urine:450]  Labs: Recent Labs    05/27/20 0440 05/28/20 0242  WBC 14.1* 13.7*  HGB 9.6* 9.8*  HCT 31.1* 31.7*  PLT 523* 581*   Recent Labs    05/27/20 0440 05/28/20 0930  NA 138 137  K 4.1 4.1  CL 94* 101  CO2 35* 28  GLUCOSE 182* 290*  BUN 11 12  CALCIUM 8.7* 8.8*    Studies/Results: No results found.   PHYSICAL EXAM: Penrose drains out Improved swelling and erythema Improved trismus   Assessment/Plan: Pt with sig improvement over 49 hrs. Cont IV abx and current tx. PO as tol. Will follow.    Jerrell Belfast 05/28/2020, 12:18 PM

## 2020-05-29 ENCOUNTER — Inpatient Hospital Stay (HOSPITAL_COMMUNITY): Payer: Medicare Other

## 2020-05-29 LAB — CBC WITH DIFFERENTIAL/PLATELET
Abs Immature Granulocytes: 0.07 10*3/uL (ref 0.00–0.07)
Basophils Absolute: 0.1 10*3/uL (ref 0.0–0.1)
Basophils Relative: 1 %
Eosinophils Absolute: 0.1 10*3/uL (ref 0.0–0.5)
Eosinophils Relative: 1 %
HCT: 31.5 % — ABNORMAL LOW (ref 36.0–46.0)
Hemoglobin: 9.7 g/dL — ABNORMAL LOW (ref 12.0–15.0)
Immature Granulocytes: 1 %
Lymphocytes Relative: 23 %
Lymphs Abs: 2.9 10*3/uL (ref 0.7–4.0)
MCH: 28.4 pg (ref 26.0–34.0)
MCHC: 30.8 g/dL (ref 30.0–36.0)
MCV: 92.4 fL (ref 80.0–100.0)
Monocytes Absolute: 1.5 10*3/uL — ABNORMAL HIGH (ref 0.1–1.0)
Monocytes Relative: 11 %
Neutro Abs: 8.2 10*3/uL — ABNORMAL HIGH (ref 1.7–7.7)
Neutrophils Relative %: 63 %
Platelets: 562 10*3/uL — ABNORMAL HIGH (ref 150–400)
RBC: 3.41 MIL/uL — ABNORMAL LOW (ref 3.87–5.11)
RDW: 14.8 % (ref 11.5–15.5)
WBC: 12.9 10*3/uL — ABNORMAL HIGH (ref 4.0–10.5)
nRBC: 0 % (ref 0.0–0.2)

## 2020-05-29 LAB — GLUCOSE, CAPILLARY
Glucose-Capillary: 179 mg/dL — ABNORMAL HIGH (ref 70–99)
Glucose-Capillary: 229 mg/dL — ABNORMAL HIGH (ref 70–99)
Glucose-Capillary: 251 mg/dL — ABNORMAL HIGH (ref 70–99)
Glucose-Capillary: 257 mg/dL — ABNORMAL HIGH (ref 70–99)
Glucose-Capillary: 278 mg/dL — ABNORMAL HIGH (ref 70–99)
Glucose-Capillary: 295 mg/dL — ABNORMAL HIGH (ref 70–99)
Glucose-Capillary: 316 mg/dL — ABNORMAL HIGH (ref 70–99)

## 2020-05-29 LAB — COMPREHENSIVE METABOLIC PANEL
ALT: 31 U/L (ref 0–44)
AST: 30 U/L (ref 15–41)
Albumin: 2.3 g/dL — ABNORMAL LOW (ref 3.5–5.0)
Alkaline Phosphatase: 89 U/L (ref 38–126)
Anion gap: 10 (ref 5–15)
BUN: 14 mg/dL (ref 8–23)
CO2: 27 mmol/L (ref 22–32)
Calcium: 8.9 mg/dL (ref 8.9–10.3)
Chloride: 96 mmol/L — ABNORMAL LOW (ref 98–111)
Creatinine, Ser: 0.63 mg/dL (ref 0.44–1.00)
GFR calc Af Amer: >60 mL/min (ref >60)
GFR calc non Af Amer: >60 mL/min (ref >60)
Glucose, Bld: 286 mg/dL — ABNORMAL HIGH (ref 70–99)
Potassium: 4.2 mmol/L (ref 3.5–5.1)
Sodium: 133 mmol/L — ABNORMAL LOW (ref 135–145)
Total Bilirubin: 0.2 mg/dL — ABNORMAL LOW (ref 0.3–1.2)
Total Protein: 6.3 g/dL — ABNORMAL LOW (ref 6.5–8.1)

## 2020-05-29 LAB — PHOSPHORUS: Phosphorus: 2.9 mg/dL (ref 2.5–4.6)

## 2020-05-29 LAB — MAGNESIUM: Magnesium: 1.8 mg/dL (ref 1.7–2.4)

## 2020-05-29 MED ORDER — CHLORHEXIDINE GLUCONATE 0.12 % MT SOLN
15.0000 mL | Freq: Two times a day (BID) | OROMUCOSAL | Status: DC
  Administered 2020-05-29 – 2020-06-05 (×14): 15 mL via OROMUCOSAL
  Filled 2020-05-29 (×12): qty 15

## 2020-05-29 MED ORDER — HYDRALAZINE HCL 20 MG/ML IJ SOLN: 10.0000 mg | Freq: Four times a day (QID) | INTRAMUSCULAR | Status: DC | PRN

## 2020-05-29 MED ORDER — ROSUVASTATIN CALCIUM 5 MG PO TABS
10.0000 mg | ORAL_TABLET | Freq: Every day | ORAL | Status: DC
  Administered 2020-05-29 – 2020-06-05 (×8): 10 mg
  Filled 2020-05-29 (×8): qty 2

## 2020-05-29 MED ORDER — INSULIN DETEMIR 100 UNIT/ML ~~LOC~~ SOLN
20.0000 [IU] | Freq: Two times a day (BID) | SUBCUTANEOUS | Status: DC
  Administered 2020-05-29 – 2020-06-03 (×10): 20 [IU] via SUBCUTANEOUS
  Filled 2020-05-29 (×13): qty 0.2

## 2020-05-29 MED ORDER — ORAL CARE MOUTH RINSE
15.0000 mL | Freq: Two times a day (BID) | OROMUCOSAL | Status: DC
  Administered 2020-05-29 – 2020-06-05 (×15): 15 mL via OROMUCOSAL

## 2020-05-29 MED ORDER — LISINOPRIL 5 MG PO TABS
5.0000 mg | ORAL_TABLET | Freq: Every day | ORAL | Status: DC
  Administered 2020-05-29 – 2020-06-05 (×8): 5 mg via ORAL
  Filled 2020-05-29 (×8): qty 1

## 2020-05-29 NOTE — Progress Notes (Signed)
°  Speech Language Pathology Treatment:    Patient Details Name: Katherine [name] MRN: [id number] DOB: 02/05/1941 Today's Date: 05/29/2020 Time: 0825-0901 SLP Time Calculation (min) (ACUTE ONLY): 36 min  Assessment / Plan / Recommendation Clinical Impression  Pt seen for dysphagia education and for assessment of tolerance of diet advancement.  Today pt alert and with tray set up by SLP was able to feed herself.  Use of straw helpful for intake with small bore feeding tube.  She demonstrates independence in taking small boluses and reflexively conducts multiple swallows.  No indication of aspiration c/b cough with all intake observed.  Given pt continues to require multiple swallows with boluses, would not recommend to advance beyond liquids at this time.  Using teach back, informed pt of need to take small sips/bites, rinse and expectorate or swallow water after meals for clearance of sugars, etc that may encourage bacteria development.  Also noted pt continues with edema right floor of mouth.  Also noted pt with right sided eye decreased opening - ? If this is baseline???    Using teach back, pt educated and agreeable to plan.  Pt reports her swallowing and voice are improved, although likely not baseline.  SLP will continue to follow for dietary advancement readiness and to assure tolerance.     HPI HPI: 79yo female admitted 05/19/20 with facial swelling and pain. PMH: HTN, HLD, DM2, recent tooth extraction followed by upper airway obstruction from submandibular abscess. Pt was intubated 7/7 - 13/21, with self extubation Pt has started on a clear liquid diet and using feeding tube for nutrition. Follow up indicated to determine tolerance.      SLP Plan  Continue with current plan of care       Recommendations  Diet recommendations:  (clear liquids) Liquids provided via: Straw Medication Administration: Via alternative means Supervision: Full supervision/cueing for compensatory  strategies;Trained caregiver to feed patient Compensations: Slow rate;Small sips/bites;Other (Comment) (multiple swallows per bite/sip as needed) Postural Changes and/or Swallow Maneuvers: Seated upright 90 degrees;Upright 30-60 min after meal                Oral Care Recommendations: Oral care QID;Oral care prior to ice chip/H20;Staff/trained caregiver to provide oral care Follow up Recommendations: Other (comment) (TBD) SLP Visit Diagnosis: Dysphagia, unspecified (R13.10) Plan: Continue with current plan of care       GO                Macario Golds 05/29/2020, 9:59 AM   Kathleen Lime, MS [place] Office [phone]

## 2020-05-29 NOTE — Progress Notes (Signed)
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  She was intubated and underwent incision and drainage and had to be placed on pressors.  7/7 she was intubated and antibiotics were started and cultures were sent.  On 7/8 she went to the operating room where she went I&D of the neck abscess and had a Penrose drain placed with culture sent.  On 7/9 pressors were weaned off and she is supported on mechanical ventilation and extubation was deferred due to upper airway swelling.  7/10 her dressing was changed and there is markable decrease the swelling but was still significant trismus concern for adequate airway protection.  Unasyn was changed to IV ceftriaxone based on sensitivity data.  On 7/11 she had improving submandibular edema and there is no issues overnight except for poorly controlled hyperglycemia.  She had extreme difficulty getting IV access in her left arm and her left arm stuck multiple times and cause swelling and bruising.  PICC line was ordered.  On 7/13 she self extubated and tolerated nasal cannula.  Because of her hemoglobin being low she was transfused 2 units of PRBCs.  On 05/26/2020 she was able to utilize suction to remove secretions.  She is remained on a little bit of Precedex and on 05/27/2020 she felt that she is able to swallow her secretions better and required no further transfusions.  WBC is improving on antibiotics and she  was transferred to [facility] service on 05/28/2020.  Yesterday she underwent a SLP evaluation and SLP has cleared her for a clear liquid diet and today SLP reevaluated and recommending continuing current plan.  She continues to have a core track for feeding currently until she gets further strengthening.  PT OT will be consulted for further evaluation recommendations and ENT recommends continue IV antibiotics and current treatment and p.o. as tolerated.  Assessment & Plan:   Active Problems:   Ludwig's angina   Abscess   Respiratory failure ([place])   Acute respiratory failure with hypoxia requiring intubation secondary to Ludwig angina and extubation on 713 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 Now Self extubated 7/13   -WBC down trending this morning it was 12.9 down from 13.7, swelling at site of incision continued to decrease.  - Antibiotic treatment with CTX day #8/14, may need extended antibiotics past 14 days depending on clinical appearance of incision/drainage site  - Continue routine dressing change  - Penrose drain management per ENT, planning to remove drain on 7/16  and ENT evaluated and recommends continuing current treatment antibiotics and p.o. as tolerated - patient can transition to oxycodone 5 mg q6 hrs PRN  - per Dr. Wilburn Cornelia, patient is cleared to advance diet as tolerated, will order SLP evaluation  and she is order for clears -SpO2: 97 % O2 Flow Rate (L/min): 2 L/min FiO2 (%): 100 % -Continue with speech therapy management and continue with core track for feeding for now -Will order PT/OT to further evaluate and treat given that she has not been out of bed; still awaiting  to see the patient  Oral Thrush, improved  - magic mouthwash - plan to transition to PRN on 7/16   Hypotension, resolved  HTN  -She is off the vasopressors -Restart home lisinopril 2 g p.o. daily -Blood pressure has been elevated last few times it was 180/60 -We  will add IV hydralazine grams every 6 as needed for systolic blood pressure greater than 938 or diastolic blood pressure than 100  T2DM  -CBGs ranged from  213-278 -Continue levemir 20 units BID; apparently had fallen off her MAR but has not been reissued - CBG monitoring closely and adjust insulin as necessary - resistant sliding scale insulin every 4 but will change to before meals and at bedtime when she is eating - will titrate insulin based on diet changes   HLD -Continue Crestor via tube  Nutrition  -CorTrak with tube feeds with Osmolite 60 mL/h. Now extubated.  -Will refer to ENT for recommendations on PO recommendations and she is on a clear liquid diet for now  Normocytic anemia/anemia of critical illness -Hemoglobin dropped down to 6.0 and hematocrit was 20.3 -She is status post 3 units of PRBCs and hemoglobin/hematocrit is now improved and stabilized.  Hemoglobin/hematocrit this morning was 9.7/31.5 -Continue to monitor for signs and symptoms of bleeding; currently no bleeding noted -repeat CBC in a.m.  Hypophosphatemia -Patient's phosphorus level was 2.3 and improved to 2.9 -replete with IV K-Phos 20 mmol yesterday -Continue to monitor and replete as necessary -Repeat phosphorus level in a.m.  Thrombocytosis -likely reactive -Patient's platelet count from 210,000 is now trended up to 581 and is now down to 562 -Plan continue monitor and trend repeat CBC level in a.m.  Left arm swelling and bruising -In the setting of multiple IVs infiltration of stick  -Continue with warm compresses and elevation- -if not improving will obtain a ultrasound to rule out pathology  Hyponatremia/hypochloremia -Mild with a sodium of 133 and a chloride level of 96 -If continues to be low we will start IV fluid hydration with normal saline -Continue to monitor and trend and repeat CMP in a.m.  DVT prophylaxis: SCDs given her recent acute anemia Code Status: FULL CODE Family  Communication: No family present at bedside  Disposition Plan: Pending further improvement and clearance by ENT and evaluation by PT and OT anticipate that she will need to go to SNF  Status is: Inpatient  Remains inpatient appropriate because:Unsafe d/c plan, IV treatments appropriate due to intensity of illness or inability to take PO and Inpatient level of care appropriate due to severity of illness   Dispo: The patient is from: Home              Anticipated d/c is to: SNF              Anticipated d/c date is: 3 days              Patient currently is not medically stable to d/c.  Consultants:   ENT  PCCM Transfer   Procedures:  CT Soft Tissue and Neck:1. Trans-spatial gas and fluid containing abscess of the floor of mouth, measuring 4.5 x 4.7 cm.2. Small subperiosteal abscess along the internal surface of the right mandibular body, near an empty posterior molar socket.  7/8 went to the operating room where she underwent incision and drainage of neck abscess, with Penrose drain placed cultures were sen  Antimicrobials:  Anti-infectives (From admission, onward)   Start     Dose/Rate Route Frequency Ordered Stop  05/22/20 1415  cefTRIAXone (ROCEPHIN) 2 g in sodium chloride 0.9 % 100 mL IVPB     Discontinue     2 g 200 mL/hr over 30 Minutes Intravenous Every 24 hours 05/22/20 1411     05/19/20 1700  ampicillin-sulbactam (UNASYN) 1.5 g in sodium chloride 0.9 % 100 mL IVPB  Status:  Discontinued        1.5 g 200 mL/hr over 30 Minutes Intravenous Every 6 hours 05/19/20 1647 05/22/20 1411     Subjective: Seen and examined at bedside and she was stating that she is feeling a little bit better and not complain of any arm pain today.  No nausea or vomiting and happy that she is able to eat some.  Denies any lightheadedness or dizziness.  No other concerns or complaints at this time.  Objective: Vitals:   05/28/20 1450 05/28/20 2117 05/28/20 2119 05/29/20 0432  BP: (!) 174/70 (!)  180/52 (!) 180/52 (!) 180/60  Pulse: 96 97 97 96  Resp: 20 20 20 20   Temp: 98.4 F (36.9 C)  100 F (37.8 C) 99.6 F (37.6 C)  TempSrc: Oral  Oral Oral  SpO2: 99%  96% 97%  Weight:      Height:        Intake/Output Summary (Last 24 hours) at 05/29/2020 1614 Last data filed at 05/29/2020 1226 Gross per 24 hour  Intake 1388.69 ml  Output 2800 ml  Net -1411.31 ml   Filed Weights   05/24/20 0409 05/25/20 0340 05/27/20 0500  Weight: 67.6 kg 64.3 kg 60.7 kg   Examination: Physical Exam:  Constitutional: The patient is a thin elderly chronically ill-appearing African-American female currently in no acute distress appears calm Eyes: Lids and conjunctivae normal, sclerae anicteric  ENMT: External Ears, Nose appear normal. Grossly normal hearing.  Has a cortrak in place Neck: Neck has a bandage around it and has the drain in place Respiratory: Diminished to auscultation bilaterally with coarse breath sounds but no, no wheezing, rales, rhonchi or crackles. Normal respiratory effort and patient is not tachypenic. No accessory muscle use.  Wearing 2 L of supplemental oxygen via nasal cannula but has unlabored breathing Cardiovascular: RRR, no murmurs / rubs / gallops. S1 and S2 auscultated.  Left arm sling has some swelling and she does have some pedal edema Abdomen: Soft, non-tender, non-distended. Bowel sounds positive.  GU: Deferred. Musculoskeletal: No clubbing / cyanosis of digits/nails. No joint deformity upper and lower extremities.  Skin: Left arm is significantly bruised and swollen compared to right arm no induration; Warm and dry.  Neurologic: CN 2-12 grossly intact with no focal deficits. Romberg sign cerebellar reflexes not assessed.  Psychiatric: Normal judgment and insight. Alert and oriented x 3. Normal mood and appropriate affect.   Data Reviewed: I have personally reviewed following labs and imaging studies  CBC: Recent Labs  Lab 05/25/20 0404 05/25/20 0404  05/25/20 1631 05/26/20 0146 05/27/20 0440 05/28/20 0242 05/29/20 1459  WBC 18.5*  --   --  15.1* 14.1* 13.7* 12.9*  NEUTROABS 13.7*  --   --  10.3* 9.6* 9.4* 8.2*  HGB 6.0*   < > 6.2* 8.8* 9.6* 9.8* 9.7*  HCT 20.3*   < > 21.1* 28.2* 31.1* 31.7* 31.5*  MCV 94.9  --   --  91.6 93.1 93.2 92.4  PLT 410*  --   --  414* 523* 581* 562*   < > = values in this interval not displayed.   Basic Metabolic Panel: Recent Labs  Lab 05/22/20 1709 05/22/20 1709 05/23/20 1033 05/24/20 0225 05/25/20 0332 05/26/20 0146 05/27/20 0440 05/28/20 0930 05/29/20 1459  NA  --   --  145   < > 143 142 138 137 133*  K  --   --  4.0   < > 3.9 4.4 4.1 4.1 4.2  CL  --   --  113*   < > 109 102 94* 101 96*  CO2  --   --  23   < > 28 33* 35* 28 27  GLUCOSE  --   --  252*   < > 279* 142* 182* 290* 286*  BUN  --   --  16   < > 11 11 11 12 14   CREATININE  --   --  0.64   < > 0.54 0.45 0.56 0.65 0.63  CALCIUM  --   --  8.6*   < > 8.1* 8.7* 8.7* 8.8* 8.9  MG 2.2   < > 1.8  --  1.4* 1.8 1.9 1.8 1.8  PHOS 2.8  --  3.6  --   --   --   --  2.3* 2.9   < > = values in this interval not displayed.   GFR: Estimated Creatinine Clearance: 55.1 mL/min (by C-G formula based on SCr of 0.63 mg/dL). Liver Function Tests: Recent Labs  Lab 05/28/20 0930 05/29/20 1459  AST 23 30  ALT 22 31  ALKPHOS 92 89  BILITOT 0.6 0.2*  PROT 6.1* 6.3*  ALBUMIN 2.3* 2.3*   No results for input(s): LIPASE, AMYLASE in the last 168 hours. No results for input(s): AMMONIA in the last 168 hours. Coagulation Profile: No results for input(s): INR, PROTIME in the last 168 hours. Cardiac Enzymes: No results for input(s): CKTOTAL, CKMB, CKMBINDEX, TROPONINI in the last 168 hours. BNP (last 3 results) No results for input(s): PROBNP in the last 8760 hours. HbA1C: No results for input(s): HGBA1C in the last 72 hours. CBG: Recent Labs  Lab 05/28/20 1945 05/29/20 0030 05/29/20 0428 05/29/20 0750 05/29/20 1225  GLUCAP 229* 316* 257*  229* 278*   Lipid Profile: No results for input(s): CHOL, HDL, LDLCALC, TRIG, CHOLHDL, LDLDIRECT in the last 72 hours. Thyroid Function Tests: No results for input(s): TSH, T4TOTAL, FREET4, T3FREE, THYROIDAB in the last 72 hours. Anemia Panel: No results for input(s): VITAMINB12, FOLATE, FERRITIN, TIBC, IRON, RETICCTPCT in the last 72 hours. Sepsis Labs: No results for input(s): PROCALCITON, LATICACIDVEN in the last 168 hours.  Recent Results (from the past 240 hour(s))  SARS Coronavirus 2 by RT PCR (hospital order, performed in [facility] hospital lab) Nasopharyngeal Nasopharyngeal Swab     Status: None   Collection Time: 05/19/20  4:36 PM   Specimen: Nasopharyngeal Swab  Result Value Ref Range Status   SARS Coronavirus 2 NEGATIVE NEGATIVE Final    Comment: (NOTE) SARS-CoV-2 target nucleic acids are NOT DETECTED.  The SARS-CoV-2 RNA is generally detectable in upper and lower respiratory specimens during the acute phase of infection. The lowest concentration of SARS-CoV-2 viral copies this assay can detect is 250 copies / mL. A negative result does not preclude SARS-CoV-2 infection and should not be used as the sole basis for treatment or other patient management decisions.  A negative result may occur with improper specimen collection / handling, submission of specimen other than nasopharyngeal swab, presence of viral mutation(s) within the areas targeted by this assay, and inadequate number of viral copies (<250 copies / mL). A negative  result must be combined with clinical observations, patient history, and epidemiological information.  Fact Sheet for Patients:   StrictlyIdeas.no  Fact Sheet for Healthcare Providers: BankingDealers.co.za  This test is not yet approved or  cleared by the [place] FDA and has been authorized for detection and/or diagnosis of SARS-CoV-2 by FDA under an Emergency Use Authorization (EUA).  This  EUA will remain in effect (meaning this test can be used) for the duration of the COVID-19 declaration under Section 564(b)(1) of the Act, 21 U.S.C. section 360bbb-3(b)(1), unless the authorization is terminated or revoked sooner.  Performed at [facility], [place] [address] [place], [place] [address]   Blood culture (routine x 2)     Status: None   Collection Time: 05/19/20  4:43 PM   Specimen: BLOOD  Result Value Ref Range Status   Specimen Description BLOOD LEFT ANTECUBITAL  Final   Special Requests   Final    BOTTLES DRAWN AEROBIC AND ANAEROBIC Blood Culture adequate volume   Culture   Final    NO GROWTH 5 DAYS Performed at [facility], [place] [address] [place], [place] [address]    Report Status 05/24/2020 FINAL  Final  MRSA PCR Screening     Status: None   Collection Time: 05/19/20  7:58 PM   Specimen: Nasopharyngeal  Result Value Ref Range Status   MRSA by PCR NEGATIVE NEGATIVE Final    Comment:        The GeneXpert MRSA Assay (FDA approved for NASAL specimens only), is one component of a comprehensive MRSA colonization surveillance program. It is not intended to diagnose MRSA infection nor to guide or monitor treatment for MRSA infections. Performed at [facility], [place] [address] [place], [place] [address]   Blood culture (routine x 2)     Status: None   Collection Time: 05/19/20 10:12 PM   Specimen: BLOOD  Result Value Ref Range Status   Specimen Description BLOOD LEFT ARM  Final   Special Requests   Final    BOTTLES DRAWN AEROBIC ONLY Blood Culture adequate volume   Culture   Final    NO GROWTH 5 DAYS Performed at [facility], [place] [address], [place], [place] [address]    Report Status 05/24/2020 FINAL  Final  Surgical pcr screen     Status: None   Collection Time: 05/20/20 12:15 PM   Specimen: Nasal Mucosa; Nasal Swab  Result Value Ref Range Status   MRSA, PCR NEGATIVE NEGATIVE Final   Staphylococcus aureus NEGATIVE NEGATIVE  Final    Comment: (NOTE) The Xpert SA Assay (FDA approved for NASAL specimens in patients 73 years of age and older), is one component of a comprehensive surveillance program. It is not intended to diagnose infection nor to guide or monitor treatment. Performed at [facility], [place] [address] [place], [place] [address]   Aerobic/Anaerobic Culture (surgical/deep wound)     Status: None   Collection Time: 05/20/20 12:57 PM   Specimen: Wound; Abscess  Result Value Ref Range Status   Specimen Description WOUND  Final   Special Requests NECK ABSCESS  Final   Gram Stain   Final    NO WBC SEEN MODERATE GRAM POSITIVE COCCI IN PAIRS MODERATE GRAM VARIABLE ROD Performed at [facility], [place] [address] [place], [place] [address]    Culture   Final    FEW STREPTOCOCCUS CONSTELLATUS MIXED ANAEROBIC FLORA PRESENT.  CALL LAB IF FURTHER  IID REQUIRED.    Report Status 05/25/2020 FINAL  Final   Organism ID, Bacteria STREPTOCOCCUS CONSTELLATUS  Final      Susceptibility   Streptococcus constellatus - MIC*    PENICILLIN INTERMEDIATE Intermediate     CEFTRIAXONE 1 SENSITIVE Sensitive     ERYTHROMYCIN <=0.12 SENSITIVE Sensitive     LEVOFLOXACIN <=0.25 SENSITIVE Sensitive     VANCOMYCIN 0.5 SENSITIVE Sensitive     * FEW STREPTOCOCCUS CONSTELLATUS     RN Pressure Injury Documentation:     Estimated body mass index is 21.6 kg/m as calculated from the following:   Height as of this encounter: 5\' 6"  (1.676 m).   Weight as of this encounter: 60.7 kg.  Malnutrition Type:  Nutrition Problem: Inadequate oral intake Etiology: acute illness, inability to eat   Malnutrition Characteristics:  Signs/Symptoms: NPO status   Nutrition Interventions:  Interventions: Other (Comment)   Radiology Studies: DG CHEST PORT 1 VIEW  Result Date: 05/29/2020 CLINICAL DATA:  Evaluate for pneumonia.  Shortness of breath. EXAM: PORTABLE CHEST 1 VIEW COMPARISON:  05/23/2020 FINDINGS:  Endotracheal tube has been removed in the interim. Feeding tube extends into the abdomen but the tip is beyond the image. Right arm PICC line tip in the SVC. Both lungs are clear. Heart and mediastinum are within normal limits. Negative for a pneumothorax. Bone structures are unremarkable. IMPRESSION: No acute cardiopulmonary disease. Support apparatuses as described. Electronically Signed   By: Markus Daft M.D.   On: 05/29/2020 12:39    Scheduled Meds: . chlorhexidine  15 mL Mouth Rinse BID  . Chlorhexidine Gluconate Cloth  6 each Topical Daily  . insulin aspart  0-20 Units Subcutaneous Q4H  . insulin detemir  20 Units Subcutaneous BID  . magic mouthwash  5 mL Oral QID  . mouth rinse  15 mL Mouth Rinse q12n4p   Continuous Infusions: . cefTRIAXone (ROCEPHIN)  IV 2 g (05/29/20 1319)  . feeding supplement (OSMOLITE 1.2 CAL) 1,000 mL (05/29/20 0509)    LOS: 10 days   Kerney Elbe, DO [facility] PAGER is on [place]  If 7PM-7AM, please contact night-coverage [email or web address]

## 2020-05-30 ENCOUNTER — Inpatient Hospital Stay (HOSPITAL_COMMUNITY): Payer: Medicare Other

## 2020-05-30 LAB — CBC WITH DIFFERENTIAL/PLATELET
Abs Immature Granulocytes: 0.06 10*3/uL (ref 0.00–0.07)
Basophils Absolute: 0.1 10*3/uL (ref 0.0–0.1)
Basophils Relative: 0 %
Eosinophils Absolute: 0.1 10*3/uL (ref 0.0–0.5)
Eosinophils Relative: 1 %
HCT: 32.9 % — ABNORMAL LOW (ref 36.0–46.0)
Hemoglobin: 10.3 g/dL — ABNORMAL LOW (ref 12.0–15.0)
Immature Granulocytes: 0 %
Lymphocytes Relative: 17 %
Lymphs Abs: 2.4 10*3/uL (ref 0.7–4.0)
MCH: 28.9 pg (ref 26.0–34.0)
MCHC: 31.3 g/dL (ref 30.0–36.0)
MCV: 92.4 fL (ref 80.0–100.0)
Monocytes Absolute: 1 10*3/uL (ref 0.1–1.0)
Monocytes Relative: 8 %
Neutro Abs: 10.2 10*3/uL — ABNORMAL HIGH (ref 1.7–7.7)
Neutrophils Relative %: 74 %
Platelets: 627 10*3/uL — ABNORMAL HIGH (ref 150–400)
RBC: 3.56 MIL/uL — ABNORMAL LOW (ref 3.87–5.11)
RDW: 14.4 % (ref 11.5–15.5)
WBC: 13.8 10*3/uL — ABNORMAL HIGH (ref 4.0–10.5)
nRBC: 0 % (ref 0.0–0.2)

## 2020-05-30 LAB — COMPREHENSIVE METABOLIC PANEL
ALT: 30 U/L (ref 0–44)
AST: 26 U/L (ref 15–41)
Albumin: 2.4 g/dL — ABNORMAL LOW (ref 3.5–5.0)
Alkaline Phosphatase: 93 U/L (ref 38–126)
Anion gap: 8 (ref 5–15)
BUN: 15 mg/dL (ref 8–23)
CO2: 29 mmol/L (ref 22–32)
Calcium: 9.2 mg/dL (ref 8.9–10.3)
Chloride: 96 mmol/L — ABNORMAL LOW (ref 98–111)
Creatinine, Ser: 0.64 mg/dL (ref 0.44–1.00)
GFR calc Af Amer: >60 mL/min (ref >60)
GFR calc non Af Amer: >60 mL/min (ref >60)
Glucose, Bld: 359 mg/dL — ABNORMAL HIGH (ref 70–99)
Potassium: 4.5 mmol/L (ref 3.5–5.1)
Sodium: 133 mmol/L — ABNORMAL LOW (ref 135–145)
Total Bilirubin: 0.7 mg/dL (ref 0.3–1.2)
Total Protein: 6.5 g/dL (ref 6.5–8.1)

## 2020-05-30 LAB — PHOSPHORUS: Phosphorus: 3.2 mg/dL (ref 2.5–4.6)

## 2020-05-30 LAB — GLUCOSE, CAPILLARY
Glucose-Capillary: 193 mg/dL — ABNORMAL HIGH (ref 70–99)
Glucose-Capillary: 234 mg/dL — ABNORMAL HIGH (ref 70–99)
Glucose-Capillary: 263 mg/dL — ABNORMAL HIGH (ref 70–99)
Glucose-Capillary: 197 mg/dL — ABNORMAL HIGH (ref 70–99)
Glucose-Capillary: 108 mg/dL — ABNORMAL HIGH (ref 70–99)
Glucose-Capillary: 250 mg/dL — ABNORMAL HIGH (ref 70–99)

## 2020-05-30 LAB — MAGNESIUM: Magnesium: 1.8 mg/dL (ref 1.7–2.4)

## 2020-05-30 MED ORDER — SODIUM CHLORIDE 0.9% FLUSH
10.0000 mL | INTRAVENOUS | Status: DC | PRN
  Administered 2020-06-02: 10 mL

## 2020-05-30 NOTE — Progress Notes (Signed)
PROGRESS NOTE    Katherine [name]  DJM:[id number] DOB: Mar 29, 1941 DOA: 05/19/2020 PCP: Lucianne Lei, MD   Brief Narrative:  79 year old African-American female with a past medical history significant for but not limited to hypertension, hyperlipidemia, type 2 diabetes mellitus, essential tremor, exotropia of right eye, glaucoma, incomplete right bundle branch block, left anterior fascicular block, stress urinary incontinence, as well as other comorbidities who recently underwent a tooth extraction and later presented with orofacial pain concerning for Ludwig's angina followed by hypotension and airway compromise.  She was intubated and underwent incision and drainage and had to be placed on pressors.  7/7 she was intubated and antibiotics were started and cultures were sent.  On 7/8 she went to the operating room where she went I&D of the neck abscess and had a Penrose drain placed with culture sent.  On 7/9 pressors were weaned off and she is supported on mechanical ventilation and extubation was deferred due to upper airway swelling.  7/10 her dressing was changed and there is markable decrease the swelling but was still significant trismus concern for adequate airway protection.  Unasyn was changed to IV ceftriaxone based on sensitivity data.  On 7/11 she had improving submandibular edema and there is no issues overnight except for poorly controlled hyperglycemia.  She had extreme difficulty getting IV access in her left arm and her left arm stuck multiple times and cause swelling and bruising.  PICC line was ordered.  On 7/13 she self extubated and tolerated nasal cannula.  Because of her hemoglobin being low she was transfused 2 units of PRBCs.  On 05/26/2020 she was able to utilize suction to remove secretions.  She is remained on a little bit of Precedex and on 05/27/2020 she felt that she is able to swallow her secretions better and required no further transfusions.  WBC is improving on antibiotics and she  was transferred to [facility] service on 05/28/2020.  On 05/28/20 she underwent a SLP evaluation and SLP has cleared her for a clear liquid diet and yesterday SLP reevaluated and recommending continuing current plan.  She continues to have a core track for feeding currently until she gets further strengthening.  PT OT will be consulted for further evaluation recommendations and ENT recommends continue IV antibiotics and current treatment and p.o. as tolerated.  Assessment & Plan:   Active Problems:   Ludwig's angina   Abscess   Respiratory failure (HCC)   Acute respiratory failure with hypoxia requiring intubation secondary to Ludwig angina and extubation on 713 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 Now Self extubated 7/13   -WBC was trending down but slightly bumped from yesterday and went from 12.9 -> 13.8, swelling at site of incision continued to decrease.  - Antibiotic treatment with CTX day #9/14, may need extended antibiotics past 14 days depending on clinical appearance of incision/drainage site  - Continue routine dressing change  - Penrose drain management per ENT, planning to remove drain on 7/16  and ENT evaluated and recommends continuing current treatment antibiotics and p.o. as tolerated - patient can transition to oxycodone 5 mg q6 hrs PRN  - per Dr. Wilburn Cornelia, patient is cleared to advance diet as tolerated, will order SLP evaluation  and she is order for clears -SpO2: 100 % O2 Flow Rate (L/min): 2 L/min FiO2 (%): 100 % -Continue with speech therapy management and continue with core track for feeding for now -Will order PT/OT to further evaluate and treat given that she has not been  out of bed; still awaiting to see the patient  Oral Thrush, improved  - magic mouthwash - plan to transition to PRN on 7/16   Hypotension, resolved  HTN  -She is off the vasopressors -Restart home lisinopril 2 g p.o. daily -Blood pressure has been elevated  last few times but now is improved and is 115/73 -We will add IV hydralazine grams every 6 as needed for systolic blood pressure greater than 128 or diastolic blood pressure than 100  T2DM  -CBGs ranged from  179-263 -Continue levemir 20 units BID; apparently had fallen off her MAR but has not been reissued - CBG monitoring closely and adjust insulin as necessary - resistant sliding scale insulin every 4 but will change to before meals and at bedtime when she is eating - will titrate insulin based on diet changes   HLD -Continue Crestor via tube  Nutrition  -CorTrak with tube feeds with Osmolite 60 mL/h. Now extubated.  -Will refer to ENT for recommendations on PO recommendations and she is on a clear liquid diet for now  Normocytic anemia/anemia of critical illness -Hemoglobin dropped down to 6.0 and hematocrit was 20.3 -She is status post 3 units of PRBCs and hemoglobin/hematocrit is now improved and stabilized.  Hemoglobin/hematocrit this morning was 10.3/39.2 -Continue to monitor for signs and symptoms of bleeding; currently no bleeding noted -repeat CBC in a.m.  Hypophosphatemia -Patient's phosphorus level was 2.3 and improved to 3.2 -replete with IV K-Phos 20 mmol yesterday -Continue to monitor and replete as necessary -Repeat phosphorus level in a.m.  Thrombocytosis -likely reactive -Patient's platelet count from 210 and is now trending up further and is 627 -Plan continue monitor and trend repeat CBC level in a.m.  Left arm swelling and bruising -In the setting of multiple IVs infiltration of stick  -Continue with warm compresses and elevation -We will order an ultrasound of the left arm  Hyponatremia/hypochloremia -Mild with a sodium of 133 and a chloride level of 96 again -If continues to be low we will start IV fluid hydration with normal saline -Continue to monitor and trend and repeat CMP in a.m.  DVT prophylaxis: SCDs given her recent acute anemia Code  Status: FULL CODE Family Communication: No family present at bedside  Disposition Plan: Pending further improvement and clearance by ENT and evaluation by PT and OT anticipate that she will need to go to SNF  Status is: Inpatient  Remains inpatient appropriate because:Unsafe d/c plan, IV treatments appropriate due to intensity of illness or inability to take PO and Inpatient level of care appropriate due to severity of illness   Dispo: The patient is from: Home              Anticipated d/c is to: SNF              Anticipated d/c date is: 3 days              Patient currently is not medically stable to d/c.  Consultants:   ENT  PCCM Transfer   Procedures:  CT Soft Tissue and Neck:1. Trans-spatial gas and fluid containing abscess of the floor of mouth, measuring 4.5 x 4.7 cm.2. Small subperiosteal abscess along the internal surface of the right mandibular body, near an empty posterior molar socket.  7/8 went to the operating room where she underwent incision and drainage of neck abscess, with Penrose drain placed cultures were sen  Antimicrobials:  Anti-infectives (From admission, onward)   Start  Dose/Rate Route Frequency Ordered Stop   05/22/20 1415  cefTRIAXone (ROCEPHIN) 2 g in sodium chloride 0.9 % 100 mL IVPB     Discontinue     2 g 200 mL/hr over 30 Minutes Intravenous Every 24 hours 05/22/20 1411     05/19/20 1700  ampicillin-sulbactam (UNASYN) 1.5 g in sodium chloride 0.9 % 100 mL IVPB  Status:  Discontinued        1.5 g 200 mL/hr over 30 Minutes Intravenous Every 6 hours 05/19/20 1647 05/22/20 1411     Subjective: Seen and examined at bedside and states that her left arm is still hurting a little bit.  No nausea or vomiting.  Thinks that she is doing okay today.  Still feels very weak.  No other concerns up at this time.  Objective: Vitals:   05/29/20 1702 05/29/20 2041 05/30/20 0520 05/30/20 1455  BP: (!) 143/54 (!) 142/53 (!) 143/51 115/73  Pulse: 95 96 96  98  Resp:  17 17 18   Temp: 99.1 F (37.3 C) 98.9 F (37.2 C) 98.8 F (37.1 C) 98.7 F (37.1 C)  TempSrc: Oral Oral Oral Oral  SpO2: 100% 100% 100% 100%  Weight:      Height:        Intake/Output Summary (Last 24 hours) at 05/30/2020 1515 Last data filed at 05/30/2020 0600 Gross per 24 hour  Intake 460 ml  Output 900 ml  Net -440 ml   Filed Weights   05/24/20 0409 05/25/20 0340 05/27/20 0500  Weight: 67.6 kg 64.3 kg 60.7 kg   Examination: Physical Exam:  Constitutional: The patient is a thin elderly chronically ill-appearing African-American female currently no acute distress appears calm and about to eat breakfast Eyes: Lids and conjunctivae normal, sclerae anicteric; has exotropia of right eye ENMT: External Ears, Nose appear normal. Grossly normal hearing.  Has a core track in her right nare Neck: Appears normal, supple, no cervical masses, normal ROM, no appreciable thyromegaly Respiratory: Diminished to auscultation bilaterally with coarse breath sounds, no wheezing, rales, rhonchi or crackles. Normal respiratory effort and patient is not tachypenic. No accessory muscle use.  Wearing supplemental oxygen via nasal cannula Cardiovascular: RRR, no murmurs / rubs / gallops. S1 and S2 auscultated.  + Pedal edema in left arm extremely swollen and bruised Abdomen: Soft, non-tender, non-distended. Bowel sounds positive.  GU: Deferred. Musculoskeletal: No clubbing / cyanosis of digits/nails. No joint deformity upper and lower extremities.  Skin: No rashes, lesions, ulcers on limited skin evaluation. No induration; Warm and dry.  Neurologic: CN 2-12 grossly intact with no focal deficits. Romberg sign and cerebellar reflexes not assessed.  Psychiatric: Normal judgment and insight. Alert and oriented x 3. Normal mood and appropriate affect.   Data Reviewed: I have personally reviewed following labs and imaging studies  CBC: Recent Labs  Lab 05/26/20 0146 05/27/20 0440  05/28/20 0242 05/29/20 1459 05/30/20 1030  WBC 15.1* 14.1* 13.7* 12.9* 13.8*  NEUTROABS 10.3* 9.6* 9.4* 8.2* 10.2*  HGB 8.8* 9.6* 9.8* 9.7* 10.3*  HCT 28.2* 31.1* 31.7* 31.5* 32.9*  MCV 91.6 93.1 93.2 92.4 92.4  PLT 414* 523* 581* 562* 431*   Basic Metabolic Panel: Recent Labs  Lab 05/26/20 0146 05/27/20 0440 05/28/20 0930 05/29/20 1459 05/30/20 1030  NA 142 138 137 133* 133*  K 4.4 4.1 4.1 4.2 4.5  CL 102 94* 101 96* 96*  CO2 33* 35* 28 27 29   GLUCOSE 142* 182* 290* 286* 359*  BUN 11 11 12 14  15  CREATININE 0.45 0.56 0.65 0.63 0.64  CALCIUM 8.7* 8.7* 8.8* 8.9 9.2  MG 1.8 1.9 1.8 1.8 1.8  PHOS  --   --  2.3* 2.9 3.2   GFR: Estimated Creatinine Clearance: 55.1 mL/min (by C-G formula based on SCr of 0.64 mg/dL). Liver Function Tests: Recent Labs  Lab 05/28/20 0930 05/29/20 1459 05/30/20 1030  AST 23 30 26   ALT 22 31 30   ALKPHOS 92 89 93  BILITOT 0.6 0.2* 0.7  PROT 6.1* 6.3* 6.5  ALBUMIN 2.3* 2.3* 2.4*   No results for input(s): LIPASE, AMYLASE in the last 168 hours. No results for input(s): AMMONIA in the last 168 hours. Coagulation Profile: No results for input(s): INR, PROTIME in the last 168 hours. Cardiac Enzymes: No results for input(s): CKTOTAL, CKMB, CKMBINDEX, TROPONINI in the last 168 hours. BNP (last 3 results) No results for input(s): PROBNP in the last 8760 hours. HbA1C: No results for input(s): HGBA1C in the last 72 hours. CBG: Recent Labs  Lab 05/29/20 2011 05/29/20 2337 05/30/20 0355 05/30/20 0714 05/30/20 1126  GLUCAP 251* 179* 193* 234* 263*   Lipid Profile: No results for input(s): CHOL, HDL, LDLCALC, TRIG, CHOLHDL, LDLDIRECT in the last 72 hours. Thyroid Function Tests: No results for input(s): TSH, T4TOTAL, FREET4, T3FREE, THYROIDAB in the last 72 hours. Anemia Panel: No results for input(s): VITAMINB12, FOLATE, FERRITIN, TIBC, IRON, RETICCTPCT in the last 72 hours. Sepsis Labs: No results for input(s): PROCALCITON,  LATICACIDVEN in the last 168 hours.  No results found for this or any previous visit (from the past 240 hour(s)).   RN Pressure Injury Documentation:     Estimated body mass index is 21.6 kg/m as calculated from the following:   Height as of this encounter: 5\' 6"  (1.676 m).   Weight as of this encounter: 60.7 kg.  Malnutrition Type:  Nutrition Problem: Inadequate oral intake Etiology: acute illness, inability to eat   Malnutrition Characteristics:  Signs/Symptoms: NPO status   Nutrition Interventions:  Interventions: Other (Comment)   Radiology Studies: DG CHEST PORT 1 VIEW  Result Date: 05/29/2020 CLINICAL DATA:  Evaluate for pneumonia.  Shortness of breath. EXAM: PORTABLE CHEST 1 VIEW COMPARISON:  05/23/2020 FINDINGS: Endotracheal tube has been removed in the interim. Feeding tube extends into the abdomen but the tip is beyond the image. Right arm PICC line tip in the SVC. Both lungs are clear. Heart and mediastinum are within normal limits. Negative for a pneumothorax. Bone structures are unremarkable. IMPRESSION: No acute cardiopulmonary disease. Support apparatuses as described. Electronically Signed   By: Markus Daft M.D.   On: 05/29/2020 12:39    Scheduled Meds: . chlorhexidine  15 mL Mouth Rinse BID  . Chlorhexidine Gluconate Cloth  6 each Topical Daily  . insulin aspart  0-20 Units Subcutaneous Q4H  . insulin detemir  20 Units Subcutaneous BID  . lisinopril  5 mg Oral Daily  . magic mouthwash  5 mL Oral QID  . mouth rinse  15 mL Mouth Rinse q12n4p  . rosuvastatin  10 mg Per Tube Daily   Continuous Infusions: . cefTRIAXone (ROCEPHIN)  IV 2 g (05/30/20 1431)  . feeding supplement (OSMOLITE 1.2 CAL) 1,000 mL (05/29/20 2135)    LOS: 11 days   Kerney Elbe, DO [facility] PAGER is on [place]  If 7PM-7AM, please contact night-coverage [email or web address]

## 2020-05-31 LAB — COMPREHENSIVE METABOLIC PANEL
ALT: 28 U/L (ref 0–44)
AST: 20 U/L (ref 15–41)
Albumin: 2.6 g/dL — ABNORMAL LOW (ref 3.5–5.0)
Alkaline Phosphatase: 89 U/L (ref 38–126)
Anion gap: 7 (ref 5–15)
BUN: 16 mg/dL (ref 8–23)
CO2: 30 mmol/L (ref 22–32)
Calcium: 9.3 mg/dL (ref 8.9–10.3)
Chloride: 97 mmol/L — ABNORMAL LOW (ref 98–111)
Creatinine, Ser: 0.56 mg/dL (ref 0.44–1.00)
GFR calc Af Amer: >60 mL/min (ref >60)
GFR calc non Af Amer: >60 mL/min (ref >60)
Glucose, Bld: 209 mg/dL — ABNORMAL HIGH (ref 70–99)
Potassium: 4.6 mmol/L (ref 3.5–5.1)
Sodium: 134 mmol/L — ABNORMAL LOW (ref 135–145)
Total Bilirubin: 0.5 mg/dL (ref 0.3–1.2)
Total Protein: 6.5 g/dL (ref 6.5–8.1)

## 2020-05-31 LAB — CBC WITH DIFFERENTIAL/PLATELET
Abs Immature Granulocytes: 0.09 10*3/uL — ABNORMAL HIGH (ref 0.00–0.07)
Basophils Absolute: 0.1 10*3/uL (ref 0.0–0.1)
Basophils Relative: 1 %
Eosinophils Absolute: 0.2 10*3/uL (ref 0.0–0.5)
Eosinophils Relative: 1 %
HCT: 34.1 % — ABNORMAL LOW (ref 36.0–46.0)
Hemoglobin: 10.5 g/dL — ABNORMAL LOW (ref 12.0–15.0)
Immature Granulocytes: 1 %
Lymphocytes Relative: 17 %
Lymphs Abs: 2.7 10*3/uL (ref 0.7–4.0)
MCH: 28.9 pg (ref 26.0–34.0)
MCHC: 30.8 g/dL (ref 30.0–36.0)
MCV: 93.9 fL (ref 80.0–100.0)
Monocytes Absolute: 1.7 10*3/uL — ABNORMAL HIGH (ref 0.1–1.0)
Monocytes Relative: 11 %
Neutro Abs: 11.2 10*3/uL — ABNORMAL HIGH (ref 1.7–7.7)
Neutrophils Relative %: 69 %
Platelets: 641 10*3/uL — ABNORMAL HIGH (ref 150–400)
RBC: 3.63 MIL/uL — ABNORMAL LOW (ref 3.87–5.11)
RDW: 14.3 % (ref 11.5–15.5)
WBC: 15.9 10*3/uL — ABNORMAL HIGH (ref 4.0–10.5)
nRBC: 0 % (ref 0.0–0.2)

## 2020-05-31 LAB — PHOSPHORUS: Phosphorus: 3.6 mg/dL (ref 2.5–4.6)

## 2020-05-31 LAB — GLUCOSE, CAPILLARY
Glucose-Capillary: 211 mg/dL — ABNORMAL HIGH (ref 70–99)
Glucose-Capillary: 168 mg/dL — ABNORMAL HIGH (ref 70–99)
Glucose-Capillary: 215 mg/dL — ABNORMAL HIGH (ref 70–99)
Glucose-Capillary: 169 mg/dL — ABNORMAL HIGH (ref 70–99)
Glucose-Capillary: 165 mg/dL — ABNORMAL HIGH (ref 70–99)

## 2020-05-31 LAB — MAGNESIUM: Magnesium: 1.9 mg/dL (ref 1.7–2.4)

## 2020-05-31 MED ORDER — MUPIROCIN 2 % EX OINT
TOPICAL_OINTMENT | CUTANEOUS | Status: AC
  Filled 2020-05-31: qty 22

## 2020-05-31 NOTE — Progress Notes (Signed)
PROGRESS NOTE    Katherine [name]  BTD:[id number] DOB: 1941/07/26 DOA: 05/19/2020 PCP: Lucianne Lei, MD   Brief Narrative:  79 year old African-American female with a past medical history significant for but not limited to hypertension, hyperlipidemia, type 2 diabetes mellitus, essential tremor, exotropia of right eye, glaucoma, incomplete right bundle branch block, left anterior fascicular block, stress urinary incontinence, as well as other comorbidities who recently underwent a tooth extraction and later presented with orofacial pain concerning for Ludwig's angina followed by hypotension and airway compromise.  She was intubated and underwent incision and drainage and had to be placed on pressors.  7/7 she was intubated and antibiotics were started and cultures were sent.  On 7/8 she went to the operating room where she went I&D of the neck abscess and had a Penrose drain placed with culture sent.  On 7/9 pressors were weaned off and she is supported on mechanical ventilation and extubation was deferred due to upper airway swelling.  7/10 her dressing was changed and there is markable decrease the swelling but was still significant trismus concern for adequate airway protection.  Unasyn was changed to IV ceftriaxone based on sensitivity data.  On 7/11 she had improving submandibular edema and there is no issues overnight except for poorly controlled hyperglycemia.  She had extreme difficulty getting IV access in her left arm and her left arm stuck multiple times and cause swelling and bruising.  PICC line was ordered.  On 7/13 she self extubated and tolerated nasal cannula.  Because of her hemoglobin being low she was transfused 2 units of PRBCs.  On 05/26/2020 she was able to utilize suction to remove secretions.  She is remained on a little bit of Precedex and on 05/27/2020 she felt that she is able to swallow her secretions better and required no further transfusions.  WBC is improving on antibiotics and she  was transferred to [facility] on 05/28/2020.  On 05/28/20 she underwent a SLP evaluation and SLP has cleared her for a clear liquid diet and yesterday SLP reevaluated and recommending continuing current plan.  She continues to have a core track for feeding currently until she gets further strengthening.  PT OT will be consulted for further evaluation recommendations and ENT recommends continue IV antibiotics and current treatment and p.o. as tolerated.  05/31/2020 WBC is trending up slowly but her hemoglobin has remained stable and she has been out of bed now.  Had a dressing change and will continue empiric antibiotics.  Arm is still swollen.  Got up to the bedside commode with assistance  Assessment & Plan:   Active Problems:   Ludwig's angina   Abscess   Respiratory failure (HCC)   Acute respiratory failure with hypoxia requiring intubation secondary to Ludwig angina and extubation on 713 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 Now Self extubated 7/13   -WBC was trending down but slightly bumped from yesterday and went from 12.9 -> 13.8 and is now further worsened to 15.9, swelling at site of incision continued to decrease but is draining a little bit - Antibiotic treatment with CTX day #10/14, may need extended antibiotics past 14 days depending on clinical appearance of incision/drainage site; if she continues to have a worsening leukocytosis we will panculture again and may escalate antibiotics - Continue routine dressing change  - Penrose drain management per ENT, planning to remove drain on 7/16  and ENT evaluated and recommends continuing current treatment antibiotics and p.o. as tolerated - patient can transition  to oxycodone 5 mg q6 hrs PRN  - per Dr. Wilburn Cornelia, patient is cleared to advance diet as tolerated, will order SLP evaluation  and she is order for clears -SpO2: 99 % O2 Flow Rate (L/min): 2 L/min FiO2 (%): 100 % -Continue with speech  therapy management and continue with core track for feeding for now -Will order PT/OT to further evaluate and treat given that she has not been out of bed -We will need to continue to monitor her respiratory status carefully and wean O2 as tolerated  Oral Thrush, improved  - magic mouthwash - plan to transition to PRN on 7/16   Hypotension, resolved  HTN  -She is off the vasopressors -Restart home lisinopril 2 g p.o. daily -Blood pressure has been elevated last few times but now is improved and is 130/72 -We will add IV hydralazine grams every 6 as needed for systolic blood pressure greater than 809 or diastolic blood pressure than 100  T2DM  -CBGs ranged from  168-250 -Continue levemir 20 units BID; apparently had fallen off her MAR but has not been reissued - CBG monitoring closely and adjust insulin as necessary - resistant sliding scale insulin every 4 but will change to before meals and at bedtime when she is eating - will titrate insulin based on diet changes   HLD -Continue Crestor via tube  Nutrition  -CorTrak with tube feeds with Osmolite 60 mL/h. Now extubated.  -Will refer to ENT for recommendations on PO recommendations and she is on a clear liquid diet for now  Normocytic anemia/anemia of critical illness -Hemoglobin dropped down to 6.0 and hematocrit was 20.3 -She is status post 3 units of PRBCs and hemoglobin/hematocrit is now improved and stabilized.  Hemoglobin/hematocrit this morning was 10.5/34.1 -Continue to monitor for signs and symptoms of bleeding; currently no bleeding noted -repeat CBC in a.m.  Hypophosphatemia -Patient's phosphorus level was 2.3 and improved to 3.6 -Continue to monitor and replete as necessary -Repeat phosphorus level in a.m.  Thrombocytosis -likely reactive and continues to worsen -Patient's platelet count from 210 and is now trending up further and is 641 -Plan continue monitor and trend repeat CBC level in a.m.  Left arm  swelling and bruising -In the setting of multiple IVs infiltration of stick  -Continue with warm compresses and elevation: Elevate extremity above the level of the heart -We will order an ultrasound of the left arm; ultrasound was read as a negative exam  Hyponatremia/hypochloremia -Mild with a sodium of 134 and a chloride level of 97 -If continues to be low we will start IV fluid hydration with normal saline -Continue to monitor and trend and repeat CMP in a.m.  DVT prophylaxis: SCDs given her recent acute anemia Code Status: FULL CODE Family Communication: No family present at bedside  Disposition Plan: Pending further improvement and clearance by ENT and evaluation by PT and OT anticipate that she will need to go to SNF  Status is: Inpatient  Remains inpatient appropriate because:Unsafe d/c plan, IV treatments appropriate due to intensity of illness or inability to take PO and Inpatient level of care appropriate due to severity of illness   Dispo: The patient is from: Home              Anticipated d/c is to: SNF              Anticipated d/c date is: 2 days              Patient currently is  not medically stable to d/c.  Consultants:   ENT  PCCM Transfer   Procedures:  CT Soft Tissue and Neck:1. Trans-spatial gas and fluid containing abscess of the floor of mouth, measuring 4.5 x 4.7 cm.2. Small subperiosteal abscess along the internal surface of the right mandibular body, near an empty posterior molar socket.  7/8 went to the operating room where she underwent incision and drainage of neck abscess, with Penrose drain placed cultures were sen  Antimicrobials:  Anti-infectives (From admission, onward)   Start     Dose/Rate Route Frequency Ordered Stop   05/22/20 1415  cefTRIAXone (ROCEPHIN) 2 g in sodium chloride 0.9 % 100 mL IVPB     Discontinue     2 g 200 mL/hr over 30 Minutes Intravenous Every 24 hours 05/22/20 1411     05/19/20 1700  ampicillin-sulbactam (UNASYN)  1.5 g in sodium chloride 0.9 % 100 mL IVPB  Status:  Discontinued        1.5 g 200 mL/hr over 30 Minutes Intravenous Every 6 hours 05/19/20 1647 05/22/20 1411     Subjective: Seen and examined at bedside and she had just come back from the bedside commode and states that she is doing okay today.  Complaining of some left arm pain but not as bad.  Thinks is still pretty swollen.  No nausea or vomiting.  States that she is able to eat Jell-O this morning.  Still feels fairly weak.  No other concerns or complaints at this time.  Objective: Vitals:   05/30/20 0520 05/30/20 1455 05/30/20 2026 05/31/20 0537  BP: (!) 143/51 115/73 (!) 158/59 130/72  Pulse: 96 98 94 93  Resp: 17 18 18 18   Temp: 98.8 F (37.1 C) 98.7 F (37.1 C) 98.1 F (36.7 C) 98 F (36.7 C)  TempSrc: Oral Oral  Oral  SpO2: 100% 100% 99% 99%  Weight:      Height:        Intake/Output Summary (Last 24 hours) at 05/31/2020 1208 Last data filed at 05/31/2020 0546 Gross per 24 hour  Intake --  Output 500 ml  Net -500 ml   Filed Weights   05/24/20 0409 05/25/20 0340 05/27/20 0500  Weight: 67.6 kg 64.3 kg 60.7 kg   Examination: Physical Exam:  Constitutional: The patient is a thin elderly chronically ill-appearing African-American female currently no acute distress lying flat and about to have her neck dressing changed Eyes: Lids and conjunctivae normal, sclerae anicteric; has exotropia of the right eye a ENMT: External Ears, Nose appear normal. Grossly normal hearing; has a Panda tube in the right nare Neck: Appears normal, supple, no cervical masses, normal ROM, no appreciable thyromegaly Respiratory: Diminished to auscultation bilaterally, no wheezing, rales, rhonchi or crackles. Normal respiratory effort and patient is not tachypenic. No accessory muscle use.  Unlabored breathing Cardiovascular: RRR, no murmurs / rubs / gallops. S1 and S2 auscultated.  Has some pedal edema has significant left arm swelling compared  to right. Abdomen: Soft, non-tender, non-distended. Bowel sounds positive.  GU: Deferred. Musculoskeletal: No clubbing / cyanosis of digits/nails. No joint deformity upper and lower extremities. Skin: No rashes, lesions, ulcers on limited skin evaluation.   Neurologic: CN 2-12 grossly intact with no focal deficits. Romberg sign and cerebellar reflexes not assessed.  Psychiatric: Normal judgment and insight. Alert and oriented x 3. Normal mood and appropriate affect.    Data Reviewed: I have personally reviewed following labs and imaging studies  CBC: Recent Labs  Lab 05/27/20 0440  05/28/20 0242 05/29/20 1459 05/30/20 1030 05/31/20 0315  WBC 14.1* 13.7* 12.9* 13.8* 15.9*  NEUTROABS 9.6* 9.4* 8.2* 10.2* 11.2*  HGB 9.6* 9.8* 9.7* 10.3* 10.5*  HCT 31.1* 31.7* 31.5* 32.9* 34.1*  MCV 93.1 93.2 92.4 92.4 93.9  PLT 523* 581* 562* 627* 970*   Basic Metabolic Panel: Recent Labs  Lab 05/27/20 0440 05/28/20 0930 05/29/20 1459 05/30/20 1030 05/31/20 0315  NA 138 137 133* 133* 134*  K 4.1 4.1 4.2 4.5 4.6  CL 94* 101 96* 96* 97*  CO2 35* 28 27 29 30   GLUCOSE 182* 290* 286* 359* 209*  BUN 11 12 14 15 16   CREATININE 0.56 0.65 0.63 0.64 0.56  CALCIUM 8.7* 8.8* 8.9 9.2 9.3  MG 1.9 1.8 1.8 1.8 1.9  PHOS  --  2.3* 2.9 3.2 3.6   GFR: Estimated Creatinine Clearance: 55.1 mL/min (by C-G formula based on SCr of 0.56 mg/dL). Liver Function Tests: Recent Labs  Lab 05/28/20 0930 05/29/20 1459 05/30/20 1030 05/31/20 0315  AST 23 30 26 20   ALT 22 31 30 28   ALKPHOS 92 89 93 89  BILITOT 0.6 0.2* 0.7 0.5  PROT 6.1* 6.3* 6.5 6.5  ALBUMIN 2.3* 2.3* 2.4* 2.6*   No results for input(s): LIPASE, AMYLASE in the last 168 hours. No results for input(s): AMMONIA in the last 168 hours. Coagulation Profile: No results for input(s): INR, PROTIME in the last 168 hours. Cardiac Enzymes: No results for input(s): CKTOTAL, CKMB, CKMBINDEX, TROPONINI in the last 168 hours. BNP (last 3 results) No  results for input(s): PROBNP in the last 8760 hours. HbA1C: No results for input(s): HGBA1C in the last 72 hours. CBG: Recent Labs  Lab 05/30/20 1934 05/30/20 2325 05/31/20 0324 05/31/20 0758 05/31/20 1144  GLUCAP 108* 250* 211* 168* 215*   Lipid Profile: No results for input(s): CHOL, HDL, LDLCALC, TRIG, CHOLHDL, LDLDIRECT in the last 72 hours. Thyroid Function Tests: No results for input(s): TSH, T4TOTAL, FREET4, T3FREE, THYROIDAB in the last 72 hours. Anemia Panel: No results for input(s): VITAMINB12, FOLATE, FERRITIN, TIBC, IRON, RETICCTPCT in the last 72 hours. Sepsis Labs: No results for input(s): PROCALCITON, LATICACIDVEN in the last 168 hours.  No results found for this or any previous visit (from the past 240 hour(s)).   RN Pressure Injury Documentation:     Estimated body mass index is 21.6 kg/m as calculated from the following:   Height as of this encounter: 5\' 6"  (1.676 m).   Weight as of this encounter: 60.7 kg.  Malnutrition Type:  Nutrition Problem: Inadequate oral intake Etiology: acute illness, inability to eat   Malnutrition Characteristics:  Signs/Symptoms: NPO status   Nutrition Interventions:  Interventions: Other (Comment)   Radiology Studies: [place] LT UPPER EXTREM LTD SOFT TISSUE NON VASCULAR  Result Date: 05/30/2020 CLINICAL DATA:  Left arm swelling. EXAM: ULTRASOUND LEFT UPPER EXTREMITY LIMITED TECHNIQUE: Ultrasound examination of the upper extremity soft tissues was performed in the area of clinical concern. COMPARISON:  None. FINDINGS: Scanning was directed toward the region of concern in the left antecubital fossa. No fluid collection or mass. No subcutaneous edema. IMPRESSION: Negative exam. Electronically Signed   By: Inge Rise M.D.   On: 05/30/2020 16:32    Scheduled Meds:  chlorhexidine  15 mL Mouth Rinse BID   Chlorhexidine Gluconate Cloth  6 each Topical Daily   insulin aspart  0-20 Units Subcutaneous Q4H   insulin  detemir  20 Units Subcutaneous BID   lisinopril  5 mg Oral Daily  magic mouthwash  5 mL Oral QID   mouth rinse  15 mL Mouth Rinse q12n4p   rosuvastatin  10 mg Per Tube Daily   Continuous Infusions:  cefTRIAXone (ROCEPHIN)  IV 2 g (05/30/20 1431)   feeding supplement (OSMOLITE 1.2 CAL) 1,000 mL (05/31/20 0850)    LOS: 12 days   Kerney Elbe, DO [facility] PAGER is on AMION  If 7PM-7AM, please contact night-coverage [email or web address]

## 2020-05-31 NOTE — Evaluation (Signed)
Occupational Therapy Evaluation Patient Details Name: Katherine [name] MRN: [id number] DOB: August 12, 1941 Today's Date: 05/31/2020    History of Present Illness 79 year old African-American female s/p recent tooth extraction with orofacial pain concerning for Ludwig's angina, hypotension and airway compromise requiring intubation 7/7, tranfusions of RBCs and 7/9 I&D of neck abcess, and self extubated 7/13. PMHx: HTN, HLD, T2DM, essential tremor, exotropia of right eye, glaucoma, incomplete right bundle branch block, left anterior fascicular block, stress urinary incontinence,   Clinical Impression   Pt PTA: Pt living with family and reports independence with ADL/mobility prior. Pt currently limited by decreased strength, decreased mobility, decreased activity tolerance, and decreased ability to care for self. Pt set-upA to modA overlal for ADL. Pt modA to minguard for ADL functional mobility. Pt would greatly benefit from continued OT skilled services. OT following acutely.  O2 98% on RA.     Follow Up Recommendations  Home health OT;Supervision/Assistance - 24 hour (initially 24/7)    Equipment Recommendations  3 in 1 bedside commode    Recommendations for Other Services       Precautions / Restrictions Precautions Precautions: Fall Precaution Comments: NG tube, dressing on chin Restrictions Weight Bearing Restrictions: No      Mobility Bed Mobility Overal bed mobility: Needs Assistance Bed Mobility: Supine to Sit     Supine to sit: Min assist;HOB elevated     General bed mobility comments: Min A to scoot bottom to EOB, use of rail and cues for sequencing.  Transfers Overall transfer level: Needs assistance Equipment used: Rolling walker (2 wheeled) Transfers: Sit to/from [employer] Sit to Stand: Mod assist;From elevated surface;+2 physical assistance Stand pivot transfers: Min assist;+2 physical assistance       General transfer comment: Pt requiring  modA +2 for initial sit to stand due to posterior lean. MinA +2 for transfers after from [facility] and recliner.     Balance Overall balance assessment: Needs assistance Sitting-balance support: Feet supported;No upper extremity supported Sitting balance-Leahy Scale: Good     Standing balance support: During functional activity Standing balance-Leahy Scale: Poor Standing balance comment: Reliant on UEs and external support due to posterior lean and LE weakness.                           ADL either performed or assessed with clinical judgement   ADL Overall ADL's : Needs assistance/impaired Eating/Feeding: Modified independent;Sitting   Grooming: Set up;Sitting   Upper Body Bathing: Set up;Sitting   Lower Body Bathing: Moderate assistance;Sitting/lateral leans;Sit to/from stand   Upper Body Dressing : Set up;Sitting   Lower Body Dressing: Moderate assistance;Sitting/lateral leans;Sit to/from stand   Toilet Transfer: Minimal assistance;+2 for physical assistance;Ambulation;RW   Toileting- Clothing Manipulation and Hygiene: Moderate assistance;Sitting/lateral lean;Sit to/from stand       Functional mobility during ADLs: Minimal assistance;+2 for physical assistance;Rolling walker;Cueing for safety General ADL Comments: Pt limited by decreased strength, decreased mobility, decreased activity tolerance, and decreased ability to care for self.     Vision Baseline Vision/History: Wears glasses Wears Glasses: At all times Patient Visual Report: No change from baseline Vision Assessment?: No apparent visual deficits     Perception     Praxis      Pertinent Vitals/Pain Pain Assessment: Faces Pain Score: 0-No pain     Hand Dominance Right   Extremity/Trunk Assessment Upper Extremity Assessment Upper Extremity Assessment: Generalized weakness;LUE deficits/detail LUE Deficits / Details: LUE swelling from previous IV sites.  Lower Extremity Assessment Lower  Extremity Assessment: Generalized weakness;Defer to PT evaluation RLE Sensation: decreased light touch (foot) LLE Sensation: decreased light touch (foot)   Cervical / Trunk Assessment Cervical / Trunk Assessment: Normal   Communication Communication Communication: No difficulties (low toned)   Cognition Arousal/Alertness: Awake/alert Behavior During Therapy: WFL for tasks assessed/performed Overall Cognitive Status: Impaired/Different from baseline Area of Impairment: Orientation                 Orientation Level: Disoriented to;Time;Situation             General Comments: Able to state year, needs cues to find date on wall. Vaguely aware of situation, "had my teeth removed and it got me in a bad way."   General Comments  Pt 98% On RA; 98 BPM with exertion    Exercises     Shoulder Instructions      Home Living Family/patient expects to be discharged to:: Private residence Living Arrangements: Spouse/significant other Available Help at Discharge: Family;Available 24 hours/day Type of Home: House Home Access: Stairs to enter [employer] of Steps: 4 on side of house, "a lot" at front with rails Entrance Stairs-Rails: None Home Layout: One level     Bathroom Shower/Tub: Chief Strategy Officer: Standard     Home Equipment: Grab bars - tub/shower;Cane - single point          Prior Functioning/Environment Level of Independence: Independent        Comments: independent with ambulation. Independent for ADLs and IADLs. Driving, grocery shopping.        OT Problem List: Decreased strength;Decreased activity tolerance;Impaired balance (sitting and/or standing);Decreased safety awareness;Decreased knowledge of use of DME or AE;Impaired UE functional use;Pain;Increased edema      OT Treatment/Interventions: Therapeutic exercise;Self-care/ADL training;Energy conservation;DME and/or AE instruction;Therapeutic activities;Cognitive  remediation/compensation;Visual/perceptual remediation/compensation;Balance training    OT Goals(Current goals can be found in the care plan section) Acute Rehab OT Goals Patient Stated Goal: to get back to independence and not rely on anyone else OT Goal Formulation: With patient Time For Goal Achievement: 06/14/20 Potential to Achieve Goals: Good ADL Goals Pt Will Perform Lower Body Dressing: with min guard assist;sitting/lateral leans;sit to/from stand Pt Will Transfer to Toilet: with supervision;ambulating;bedside commode Pt Will Perform Toileting - Clothing Manipulation and hygiene: with min guard assist;sitting/lateral leans;sit to/from stand Pt/caregiver will Perform Home Exercise Program: Increased strength;Both right and left upper extremity;With Supervision Additional ADL Goal #1: Pt will increase to supervisionA overall for OOB ADL in order to increase independence with ADL.  OT Frequency: Min 2X/week   Barriers to D/C:            Co-evaluation PT/OT/SLP Co-Evaluation/Treatment: Yes Reason for Co-Treatment: Complexity of the patient's impairments (multi-system involvement);For patient/therapist safety   OT goals addressed during session: ADL's and self-care      AM-PAC OT "6 Clicks" Daily Activity     Outcome Measure Help from another person eating meals?: None Help from another person taking care of personal grooming?: A Little Help from another person toileting, which includes using toliet, bedpan, or urinal?: A Lot Help from another person bathing (including washing, rinsing, drying)?: A Lot Help from another person to put on and taking off regular upper body clothing?: A Little Help from another person to put on and taking off regular lower body clothing?: A Lot 6 Click Score: 16   End of Session Equipment Utilized During Treatment: Gait belt;Rolling walker Nurse Communication: Mobility status  Activity Tolerance: Patient  tolerated treatment well Patient left:  in chair;with call bell/phone within reach;with chair alarm set  OT Visit Diagnosis: Unsteadiness on feet (R26.81);Muscle weakness (generalized) (M62.81)                Time: 1308-6578 OT Time Calculation (min): 42 min Charges:  OT General Charges $OT Visit: 1 Visit OT Evaluation $OT Eval Moderate Complexity: 1 Mod  Flora Lipps, OTR/L Acute Rehabilitation Services Pager: [phone] Office: [phone]   Racheal Mathurin C 05/31/2020, 4:27 PM

## 2020-05-31 NOTE — Progress Notes (Signed)
Inpatient Diabetes Program Recommendations  AACE/ADA: New Consensus Statement on Inpatient Glycemic Control (2015)  Target Ranges:  Prepandial:   less than 140 mg/dL      Peak postprandial:   less than 180 mg/dL (1-2 hours)      Critically ill patients:  140 - 180 mg/dL   Lab Results  Component Value Date   GLUCAP 215 (H) 05/31/2020   HGBA1C 10.3 (H) 05/19/2020    Review of Glycemic Control  Diabetes history: DM 2 Outpatient Diabetes medications: Metformin 500 mg BID (not taking 3-4 months?) Current orders for Inpatient glycemic control:  Levemir 20 units bid Novolog 0-20 units Q4 hours  Osmolite 60 ml/hour  Inpatient Diabetes Program Recommendations:    -consider Novolog 4 units Q4 hours tube feed coverage  Generally spoke with pt regarding glucose control prior to coming to hospital. Discussed glucose and A1c goals to prevent further complications from DM. Discussed tight glucose control while she is in the hospital and monitoring.   Thanks,  Tama Headings RN, MSN, BC-ADM Inpatient Diabetes Coordinator Team Pager [phone] (8a-5p)

## 2020-05-31 NOTE — Evaluation (Signed)
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 PMHx: HTN, HLD, T2DM, essential tremor, exotropia of right eye, glaucoma, incomplete right bundle branch block, left anterior fascicular block, stress urinary incontinence,  Clinical Impression  Patient presents with generalized weakness, impaired balance and impaired mobility s/p above. Pt independent with ADLs and ambulation PTA and lives with spouse. Today, pt tolerated transfers and gait training with Min-Mod A and use of RW for support. Pt highly motivated to return to independence. Anticipate with increased activity, mobility and strength will improve quickly. Encouraged OOB to chair as much as tolerated. Will follow acutely to maximize independence and mobility prior to return home.    Follow Up Recommendations Home health PT;Supervision for mobility/OOB;Supervision/Assistance - 24 hour (pending progress)    Equipment Recommendations  Rolling walker with 5" wheels    Recommendations for Other Services       Precautions / Restrictions Precautions Precautions: Fall Precaution Comments: NG tube, dressing on chin Restrictions Weight Bearing Restrictions: No      Mobility  Bed Mobility Overal bed mobility: Needs Assistance Bed Mobility: Supine to Sit     Supine to sit: Min assist;HOB elevated     General bed mobility comments: Min A to scoot bottom to EOB, use of rail and cues for sequencing.  Transfers Overall transfer level: Needs assistance Equipment used: Rolling walker (2 wheeled) Transfers: Sit to/from [facility] Sit to Stand: From elevated surface;+2  physical assistance;Mod assist Stand pivot transfers: Min assist;+2 safety/equipment       General transfer comment: Assist to power to standing with cues for hand placement/technique; posterior lean with LEs locked out into extension being supported by surface. Stood from [employer], SPT bed to [facility] with assist with RW navigation, transferred to chair post ambulation.  Ambulation/Gait Ambulation/Gait assistance: Min guard Gait Distance (Feet): 8 Feet Assistive device: Rolling walker (2 wheeled) Gait Pattern/deviations: Step-through pattern;Decreased stride length;Leaning posteriorly;Narrow base of support Gait velocity: decreased Gait velocity interpretation: <1.31 ft/sec, indicative of household ambulator General Gait Details: Slow, mildly unsteady gait with assist for balance and RW navigation; occasional posterior lean noted.  Stairs            Wheelchair Mobility    Modified Rankin (Stroke Patients Only)       Balance Overall balance assessment: Needs assistance Sitting-balance support: Feet supported;No upper extremity supported Sitting balance-Leahy Scale: Good     Standing balance support: During functional activity Standing balance-Leahy Scale: Poor Standing balance comment: Reliant on UEs and external support due to posterior lean and LE weakness.                             Pertinent Vitals/Pain Pain Assessment: No/denies pain    Home Living Family/patient expects to be discharged to:: Private residence Living Arrangements: Spouse/significant other Available Help at Discharge: Family;Available 24 hours/day Type of Home: House Home Access: Stairs to enter Entrance Stairs-Rails: None Entrance Stairs-Number of Steps: 4 on side of house, "a lot" at front with rails Home Layout: One level Home Equipment: Grab bars - tub/shower;Cane - single point      Prior Function Level of Independence: Independent  Comments: independent with  ambulation. Independent for ADLs and IADLs. Driving, grocery shopping.     Hand Dominance   Dominant Hand: Right    Extremity/Trunk Assessment   Upper Extremity Assessment Upper Extremity Assessment: Defer to OT evaluation    Lower Extremity Assessment Lower Extremity Assessment: Generalized weakness;RLE deficits/detail;LLE deficits/detail (grossly ~3/5 throughout) RLE Sensation: decreased light touch (foot) LLE Sensation: decreased light touch (foot)    Cervical / Trunk Assessment Cervical / Trunk Assessment: Normal  Communication   Communication: No difficulties (low toned)  Cognition Arousal/Alertness: Awake/alert Behavior During Therapy: WFL for tasks assessed/performed Overall Cognitive Status: Impaired/Different from baseline Area of Impairment: Orientation                 Orientation Level: Disoriented to;Time;Situation             General Comments: Able to state year, needs cues to find date on wall. Vaguely aware of situation, "had my teeth removed and it got me in a bad way."      General Comments      Exercises     Assessment/Plan    PT Assessment Patient needs continued PT services  PT Problem List Decreased strength;Decreased mobility;Decreased balance;Decreased knowledge of use of DME       PT Treatment Interventions Therapeutic activities;Gait training;Therapeutic exercise;Patient/family education;Balance training;Functional mobility training    PT Goals (Current goals can be found in the Care Plan section)  Acute Rehab PT Goals Patient Stated Goal: to get back to independence and not rely on anyone else PT Goal Formulation: With patient Time For Goal Achievement: 06/14/20 Potential to Achieve Goals: Good    Frequency Min 3X/week   Barriers to discharge        Co-evaluation               AM-PAC PT "6 Clicks" Mobility  Outcome Measure Help needed turning from your back to your side while in a flat bed without using  bedrails?: A Little Help needed moving from lying on your back to sitting on the side of a flat bed without using bedrails?: A Little Help needed moving to and from a bed to a chair (including a wheelchair)?: A Little Help needed standing up from a chair using your arms (e.g., wheelchair or bedside chair)?: A Lot Help needed to walk in hospital room?: A Little Help needed climbing 3-5 steps with a railing? : A Lot 6 Click Score: 16    End of Session Equipment Utilized During Treatment: Gait belt Activity Tolerance: Patient tolerated treatment well Patient left: in chair;with call bell/phone within reach;with chair alarm set;with nursing/sitter in room Nurse Communication: Mobility status;Other (comment) (purewick) PT Visit Diagnosis: Muscle weakness (generalized) (M62.81);Unsteadiness on feet (R26.81)    Time: 4270-6237 PT Time Calculation (min) (ACUTE ONLY): 36 min   Charges:   PT Evaluation $PT Eval Moderate Complexity: 1 Mod          Marisa Severin, PT, DPT Acute Rehabilitation Services Pager [phone] Office [place] 05/31/2020, 1:02 PM

## 2020-05-31 NOTE — TOC Initial Note (Addendum)
Transition of Care [facility]) - Initial/Assessment Note    Patient Details  Name: Katherine [name] MRN: [id number] Date of Birth: 1941-03-16  Transition of Care [facility]) CM/SW Contact:    Alexander Mt, LCSW Phone Number: 05/31/2020, 12:05 PM  Clinical Narrative:                 CSW spoke with pt at bedside. Introduced self, role, reason for visit. Pt very soft spoken but able to answer most of questions. She is from home with her husband Katherine [name]. She has a cane but shares doesn't really use it. Confirmed home address and PCP. We discussed therapy, she states they have seen her (no note in at time of assessment). She was pleased with how she did stating "it felt good." She is looking forward to getting coretrak out and advancing diet as she can.   She gives permission as she advances for [facility] team to speak with her and to call [facility] about recommendations.   TOC team continues to follow.   Expected Discharge Plan: [place] (vs [facility]) Barriers to Discharge: Continued Medical Work up   Patient Goals and CMS Choice   CMS Medicare.gov Compare Post Acute Care list provided to:: Patient Choice offered to / list presented to : Patient, Spouse  Expected Discharge Plan and Services Expected Discharge Plan: [place] (vs [facility]) In-house Referral: Clinical Social Work Discharge Planning Services: CM Consult Post Acute Care Choice: Durable Medical Equipment, Home Health, [place] Living arrangements for the past 2 months: Single Family Home  Prior Living Arrangements/Services Living arrangements for the past 2 months: Single Family Home Lives with:: Spouse Patient language and need for interpreter reviewed:: Yes (no needs) Do you feel safe going back to the place where you live?: Yes      Need for Family Participation in Patient Care: Yes (Comment) (assistance with daily cares) Care giver support system in place?: Yes (comment) (pt spouse) Current home  services: DME Criminal Activity/Legal Involvement Pertinent to Current Situation/Hospitalization: No - Comment as needed   Permission Sought/Granted Permission sought to share information with : Family Supports Permission granted to share information with : Yes, Verbal Permission Granted  Share Information with NAME: Katherine [name]     Permission granted to share info w Relationship: husband  Permission granted to share info w Contact Information: [phone]  Emotional Assessment Appearance:: Appears stated age Attitude/Demeanor/Rapport: Engaged Affect (typically observed): Accepting, Adaptable Orientation: : Oriented to Self, Oriented to  Time, Oriented to Place, Oriented to Situation Alcohol / Substance Use: Not Applicable Psych Involvement: No (comment)  Admission diagnosis:  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 Respiratory failure ([place])   . 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 Abscess 05/19/2020  . Essential tremor 09/25/2019  . Dizziness 02/28/2019  . Mixed hyperlipidemia 02/28/2019  . Lacunar stroke ([place]) 02/28/2019  . Lightheadedness 02/28/2019   PCP:  Lucianne Lei, MD Pharmacy:   [facility], [place] [address] Phone: [phone] Fax: [place], [place] - [address] [place] [address] Phone: [phone] Fax: [phone]  Readmission Risk Interventions Readmission Risk Prevention Plan 05/31/2020  Post  Dischage Appt Not Complete  Appt Comments pending disposition  Medication Screening Complete  Transportation Screening Complete  Some  recent data might be hidden

## 2020-06-01 LAB — GLUCOSE, CAPILLARY
Glucose-Capillary: 179 mg/dL — ABNORMAL HIGH (ref 70–99)
Glucose-Capillary: 187 mg/dL — ABNORMAL HIGH (ref 70–99)
Glucose-Capillary: 195 mg/dL — ABNORMAL HIGH (ref 70–99)
Glucose-Capillary: 261 mg/dL — ABNORMAL HIGH (ref 70–99)
Glucose-Capillary: 292 mg/dL — ABNORMAL HIGH (ref 70–99)
Glucose-Capillary: 72 mg/dL (ref 70–99)
Glucose-Capillary: 81 mg/dL (ref 70–99)

## 2020-06-01 LAB — COMPREHENSIVE METABOLIC PANEL
ALT: 22 U/L (ref 0–44)
AST: 16 U/L (ref 15–41)
Albumin: 2.5 g/dL — ABNORMAL LOW (ref 3.5–5.0)
Alkaline Phosphatase: 79 U/L (ref 38–126)
Anion gap: 10 (ref 5–15)
BUN: 19 mg/dL (ref 8–23)
CO2: 28 mmol/L (ref 22–32)
Calcium: 9.2 mg/dL (ref 8.9–10.3)
Chloride: 98 mmol/L (ref 98–111)
Creatinine, Ser: 0.61 mg/dL (ref 0.44–1.00)
GFR calc Af Amer: >60 mL/min (ref >60)
GFR calc non Af Amer: >60 mL/min (ref >60)
Glucose, Bld: 216 mg/dL — ABNORMAL HIGH (ref 70–99)
Potassium: 4.6 mmol/L (ref 3.5–5.1)
Sodium: 136 mmol/L (ref 135–145)
Total Bilirubin: 0.5 mg/dL (ref 0.3–1.2)
Total Protein: 6.4 g/dL — ABNORMAL LOW (ref 6.5–8.1)

## 2020-06-01 LAB — CBC WITH DIFFERENTIAL/PLATELET
Abs Immature Granulocytes: 0.05 10*3/uL (ref 0.00–0.07)
Basophils Absolute: 0.1 10*3/uL (ref 0.0–0.1)
Basophils Relative: 1 %
Eosinophils Absolute: 0.2 10*3/uL (ref 0.0–0.5)
Eosinophils Relative: 1 %
HCT: 32.8 % — ABNORMAL LOW (ref 36.0–46.0)
Hemoglobin: 10 g/dL — ABNORMAL LOW (ref 12.0–15.0)
Immature Granulocytes: 0 %
Lymphocytes Relative: 18 %
Lymphs Abs: 2.6 10*3/uL (ref 0.7–4.0)
MCH: 28.7 pg (ref 26.0–34.0)
MCHC: 30.5 g/dL (ref 30.0–36.0)
MCV: 94.3 fL (ref 80.0–100.0)
Monocytes Absolute: 1.7 10*3/uL — ABNORMAL HIGH (ref 0.1–1.0)
Monocytes Relative: 12 %
Neutro Abs: 9.6 10*3/uL — ABNORMAL HIGH (ref 1.7–7.7)
Neutrophils Relative %: 68 %
Platelets: 587 10*3/uL — ABNORMAL HIGH (ref 150–400)
RBC: 3.48 MIL/uL — ABNORMAL LOW (ref 3.87–5.11)
RDW: 14.3 % (ref 11.5–15.5)
WBC: 14.2 10*3/uL — ABNORMAL HIGH (ref 4.0–10.5)
nRBC: 0 % (ref 0.0–0.2)

## 2020-06-01 LAB — MAGNESIUM: Magnesium: 1.8 mg/dL (ref 1.7–2.4)

## 2020-06-01 LAB — PHOSPHORUS: Phosphorus: 3.3 mg/dL (ref 2.5–4.6)

## 2020-06-01 MED ORDER — FLUTICASONE PROPIONATE 50 MCG/ACT NA SUSP
1.0000 | Freq: Every day | NASAL | Status: DC
  Administered 2020-06-01 – 2020-06-05 (×5): 1 via NASAL
  Filled 2020-06-01: qty 16

## 2020-06-01 MED ORDER — ADULT MULTIVITAMIN W/MINERALS CH
1.0000 | ORAL_TABLET | Freq: Every day | ORAL | Status: DC
  Administered 2020-06-01 – 2020-06-05 (×5): 1 via ORAL
  Filled 2020-06-01 (×5): qty 1

## 2020-06-01 MED ORDER — FUROSEMIDE 10 MG/ML IJ SOLN
40.0000 mg | Freq: Once | INTRAMUSCULAR | Status: AC
  Administered 2020-06-01: 40 mg via INTRAVENOUS
  Filled 2020-06-01: qty 4

## 2020-06-01 MED ORDER — INSULIN ASPART 100 UNIT/ML ~~LOC~~ SOLN
4.0000 [IU] | SUBCUTANEOUS | Status: DC
  Administered 2020-06-01: 4 [IU] via SUBCUTANEOUS

## 2020-06-01 MED ORDER — GABAPENTIN 300 MG PO CAPS
300.0000 mg | ORAL_CAPSULE | Freq: Two times a day (BID) | ORAL | Status: DC
  Administered 2020-06-01 – 2020-06-05 (×9): 300 mg via ORAL
  Filled 2020-06-01 (×9): qty 1

## 2020-06-01 MED ORDER — INSULIN ASPART 100 UNIT/ML ~~LOC~~ SOLN
0.0000 [IU] | Freq: Three times a day (TID) | SUBCUTANEOUS | Status: DC
  Administered 2020-06-02 (×2): 5 [IU] via SUBCUTANEOUS
  Administered 2020-06-03: 8 [IU] via SUBCUTANEOUS
  Administered 2020-06-03: 5 [IU] via SUBCUTANEOUS

## 2020-06-01 MED ORDER — GLUCERNA SHAKE PO LIQD
237.0000 mL | Freq: Three times a day (TID) | ORAL | Status: DC
  Administered 2020-06-01 – 2020-06-05 (×12): 237 mL via ORAL

## 2020-06-01 NOTE — Progress Notes (Signed)
   ENT Progress Note: POD #12 s/p Procedure(s): INCISION AND DRAINAGE OF NECK ABSCESS   Subjective: Improving  Objective: Vital signs in last 24 hours: Temp:  [98.5 F (36.9 C)-99.4 F (37.4 C)] 99.4 F (37.4 C) (07/20 1426) Pulse Rate:  [93-100] 100 (07/20 1426) Resp:  [17-18] 18 (07/20 1426) BP: (122-144)/(38-58) 122/58 (07/20 1426) SpO2:  [99 %-100 %] 100 % (07/20 0416) Weight change:  Last BM Date: 05/31/20  Intake/Output from previous day: 07/19 0701 - 07/20 0700 In: 5300 [P.O.:1600; NG/GT:3600; IV Piggyback:100] Out: 1125 [Urine:1125] Intake/Output this shift: Total I/O In: 120 [P.O.:120] Out: 700 [Urine:700]  Labs: Recent Labs    05/31/20 0315 06/01/20 0352  WBC 15.9* 14.2*  HGB 10.5* 10.0*  HCT 34.1* 32.8*  PLT 641* 587*   Recent Labs    05/31/20 0315 06/01/20 0352  NA 134* 136  K 4.6 4.6  CL 97* 98  CO2 30 28  GLUCOSE 209* 216*  BUN 16 19  CALCIUM 9.3 9.2    Studies/Results: No results found.   PHYSICAL EXAM: Resolving submental edema I&D site open with min d/c   Assessment/Plan: Cont improvement Adv diet per SPTx Cont wound care Will sign off - f/u as outpt    Jerrell Belfast 06/01/2020, 4:32 PM

## 2020-06-01 NOTE — Progress Notes (Addendum)
Patient's Cortrak clogged after she ate a dysphagia 1 diet.  We will remove her Panda Tube  and adjust her insulin regimen and stop the scheduled insulin with the TF and change from a resistant scale every 4h to a moderate NovoLog/scale insulin before meals and at bedtime.

## 2020-06-01 NOTE — Progress Notes (Signed)
Speech Language Pathology Treatment: Dysphagia  Patient Details Name: Katherine [name] MRN: [id number] DOB: June 29, 1941 Today's Date: 06/01/2020 Time: 6578-4696 SLP Time Calculation (min) (ACUTE ONLY): 15 min  Assessment / Plan / Recommendation Clinical Impression  Ms. Wragge was seen for diet advancement. She reports wanting the feeding tube out of her nose and some better food. She has been on clear liquids with good tolerance. She was trialed with purees and regular solid, but is presently edentulous. Pt typically wears dentures for all PO intake, but given oral abcess, cannot wear them at this time. She had no s/s aspiration or dysphagia with any consistency, but was noted to allow graham cracker to sit and melt in mouth versus masticate. Most solids would not generalize this way, so recommend purees at this time and thin liquids. Pt was very excited about the upgrade. Education was provided regarding ongoing need for pt to rinse mouth after intake to ensure oral clearance. Pt verbalized understanding. ST service to follow for further advancement as clinically indicated.    HPI HPI: 79yo female admitted 05/19/20 with facial swelling and pain. PMH: HTN, HLD, DM2, recent tooth extraction followed by upper airway obstruction from submandibular abscess. Pt was intubated 7/7 - 13/21, with self extubation Pt has started on a clear liquid diet and using feeding tube for nutrition. Follow up indicated to determine tolerance.      SLP Plan  Continue with current plan of care       Recommendations  Diet recommendations: Dysphagia 1 (puree);Thin liquid Liquids provided via: Cup Medication Administration: Via alternative means Supervision: Full supervision/cueing for compensatory strategies;Intermittent supervision to cue for compensatory strategies Compensations: Slow rate;Small sips/bites;Other (Comment) (multiple swallows per bite/sip as needed) Postural Changes and/or Swallow Maneuvers: Seated  upright 90 degrees;Upright 30-60 min after meal                Oral Care Recommendations: Staff/trained caregiver to provide oral care;Oral care BID Follow up Recommendations: None SLP Visit Diagnosis: Dysphagia, unspecified (R13.10) Plan: Continue with current plan of care                     Lana Flaim P. Sabria Florido, M.S., CCC-SLP Speech-Language Pathologist Acute Rehabilitation Services Pager: [phone]  Susanne Borders Idali Lafever 06/01/2020, 10:31 AM

## 2020-06-01 NOTE — Progress Notes (Addendum)
Nutrition Follow-up  RD working remotely.  DOCUMENTATION CODES:   Not applicable  INTERVENTION:   -Initiate 48 hour calorie count -Magic cup TID with meals, each supplement provides 290 kcal and 9 grams of protein -Glucerna Shake po TID, each supplement provides 220 kcal and 10 grams of protein -D/c TF oders, due to no feeding access   NUTRITION DIAGNOSIS:   Inadequate oral intake related to acute illness, inability to eat as evidenced by NPO status.  Progressing; advanced to dysphagia 1 diet with thin liquids today  GOAL:   Patient will meet greater than or equal to 90% of their needs  Met with TF  MONITOR:   PO intake, Supplement acceptance, Diet advancement, Weight trends, Labs, Skin, I & O's  REASON FOR ASSESSMENT:   Consult Diet education, Calorie Count  ASSESSMENT:   79 yo female admitted with acute respiratory failure requiring intubation due to upper airway obstruction from submandibular abscess. PMH includes HTN, HLD, DM, recent tooth extraction  7/07 Intubated 7/08 I&D neck abscess 7/09 Cortrak placed, TF initiated 7/12 Cortrak tube replaced 7/14 Self-Extubated 7/16 s/p BSE- advanced to clear liquid diet 7/20 advanced to dysphagia 1 diet with thin liquids  Reviewed I/O's:+4.2 L x 24 hours and +14 L since admission  UOP: 1.1 L x 24 hours  Attempted to speak with pt via hospital room phone, however, no answer.   Case discussed with SLP, who reports pt has been advanced to a dysphagia 1 diet with thin liquids. MD has requested calorie count due to diet advancement.   Noted meal completion poor; po 20-30%.   ADDENDUM: Noted cortak has been removed and TF has been d/c by MD.  Labs reviewed: CBGS: [id number] (inpatient orders for glycemic control are 0-20 units insulin aspart every 4 hours, 4 units inuslin aspart every 4 hours, and 20 units inuslin detemir daily).   Diet Order:   Diet Order            DIET - DYS 1 Room service appropriate? Yes with  Assist; Fluid consistency: Thin  Diet effective now                 EDUCATION NEEDS:   Not appropriate for education at this time  Skin:  Skin Assessment: Skin Integrity Issues: Skin Integrity Issues:: Incisions Incisions: face/neck  Last BM:  05/31/20  Height:   Ht Readings from Last 1 Encounters:  05/19/20 _0  (1.676 m)    Weight:   Wt Readings from Last 1 Encounters:  05/27/20 60.7 kg   BMI:  Body mass index is 21.6 kg/m.  Estimated Nutritional Needs:   Kcal:  3903-0092  Protein:  85-100 grams  Fluid:  > 1.7 L    Loistine Chance, RD, LDN, [place] Registered Dietitian II Certified Diabetes Care and Education Specialist Please refer to [facility] for RD and/or RD on-call/weekend/after hours pager

## 2020-06-01 NOTE — Progress Notes (Signed)
PROGRESS NOTE    Katherine [name]  WOE:[id number] DOB: 02-26-1941 DOA: 05/19/2020 PCP: Lucianne Lei, MD   Brief Narrative:  79 year old African-American female with a past medical history significant for but not limited to hypertension, hyperlipidemia, type 2 diabetes mellitus, essential tremor, exotropia of right eye, glaucoma, incomplete right bundle branch block, left anterior fascicular block, stress urinary incontinence, as well as other comorbidities who recently underwent a tooth extraction and later presented with orofacial pain concerning for Ludwig's angina followed by hypotension and airway compromise.  She was intubated and underwent incision and drainage and had to be placed on pressors.  7/7 she was intubated and antibiotics were started and cultures were sent.  On 7/8 she went to the operating room where she went I&D of the neck abscess and had a Penrose drain placed with culture sent.  On 7/9 pressors were weaned off and she is supported on mechanical ventilation and extubation was deferred due to upper airway swelling.  7/10 her dressing was changed and there is markable decrease the swelling but was still significant trismus concern for adequate airway protection.  Unasyn was changed to IV ceftriaxone based on sensitivity data.  On 7/11 she had improving submandibular edema and there is no issues overnight except for poorly controlled hyperglycemia.  She had extreme difficulty getting IV access in her left arm and her left arm stuck multiple times and cause swelling and bruising.  PICC line was ordered.  On 7/13 she self extubated and tolerated nasal cannula.  Because of her hemoglobin being low she was transfused 2 units of PRBCs.  On 05/26/2020 she was able to utilize suction to remove secretions.  She is remained on a little bit of Precedex and on 05/27/2020 she felt that she is able to swallow her secretions better and required no further transfusions.  WBC is improving on antibiotics and she  was transferred to [facility] service on 05/28/2020.  On 05/28/20 she underwent a SLP evaluation and SLP has cleared her for a clear liquid diet and yesterday SLP reevaluated and recommending continuing current plan.  She continues to have a core track for feeding currently until she gets further strengthening.  PT OT will be consulted for further evaluation recommendations and ENT recommends continue IV antibiotics and current treatment and p.o. as tolerated.  05/31/2020 WBC is trending up slowly but her hemoglobin has remained stable and she has been out of bed now.  Had a dressing change and will continue empiric antibiotics.  Arm is still swollen.  Got up to the bedside commode with assistance  Today 06/01/2020 she is complaining of some nasal congestion so we will start her on some Flonase.  Arm remains swollen so we will try some Lasix have any improvement with elevation.  Diet is being advanced to a dysphagia 1 diet with thin liquids but will keep the core track in place for now until we can knowing that she can tolerate her diet.  We will order a calorie count.  PT OT recommending home health PT or supervision for mobility pending on progress and recommending rolling walker with 5 inch wheels  Assessment & Plan:   Active Problems:   Ludwig's angina   Abscess   Respiratory failure ([place])   Acute respiratory failure with hypoxia requiring intubation secondary to Ludwig angina and extubation on 713, improving 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 Now Self extubated 7/13   -WBC was trending down but slightly bumped from yesterday and went from 12.9 ->  13.8 and is now further worsened to 15.9, swelling at site of incision continued to decrease but is draining a little bit - Antibiotic treatment with CTX day #10/14, may need extended antibiotics past 14 days depending on clinical appearance of incision/drainage site; if she continues to have a worsening leukocytosis we  will panculture again and may escalate antibiotics - Continue routine dressing change  - Penrose drain management per ENT, planning to remove drain on 7/16  and ENT evaluated and recommends continuing current treatment antibiotics and p.o. as tolerated - patient can transition to oxycodone 5 mg q6 hrs PRN  - per Dr. Wilburn Cornelia, patient is cleared to advance diet as tolerated, will order SLP evaluation  and she is order for clears -SpO2: 100 % O2 Flow Rate (L/min): 2 L/min FiO2 (%): 100 % -Continue with speech therapy management and continue with CorTrak for feeding for now; diet is being advanced to dysphagia 1 diet with thin liquids today -Will order PT/OT to further evaluate and treat given that she has not been out of bed and they are recommending home health OT and PT for now with 24-hour supervision and assistance along with a 3 and 1 bedside commode as well as a rolling walker with 5 inch wheels -We will give a dose of IV Lasix 40 mg x 1; upon further review she is almost 14 L positive will need strict I's and O's and daily weights -We will need to continue to monitor her respiratory status carefully and wean O2 as tolerated  Oral Thrush, improved  - magic mouthwash - plan to transition to PRN on 7/16   Hypotension, resolved  HTN  -She is off the vasopressors -Restart home lisinopril 2 g p.o. daily -Blood pressure has been elevated last few times but now is improved and is 130/38 -We will add IV hydralazine grams every 6 as needed for systolic blood pressure greater than 952 or diastolic blood pressure than 100 -We will give a dose of IV Lasix today  Nasal Congestion -We will try Flonase and defer to ENT for further recommendations -She has a cortrak and hopefully we can pull a cortrak out soon -Appreciate further ENT evaluation and recommendations  T2DM  -CBGs ranged from 165-292 -Continue levemir 20 units BID; apparently had fallen off her MAR but has not been reissued -  CBG monitoring closely and adjust insulin as necessary - resistant sliding scale insulin every 4 but will change to before meals and at bedtime when she is eating -Diabetic education coordinator recommending 4 units every 4 scheduled given her continuous tube feedings - will titrate insulin based on diet changes  -Patient was complaining of some neuropathy symptoms in the lower extremities today likely attributable to her diabetes so we will start her on Gabapentin 300 g p.o. twice daily  HLD -Continue Crestor via tube  Nutrition  -CorTrak with tube feeds with Osmolite 60 mL/h. Now extubated.  -She is now on dysphagia 1 diet with thin liquids -We will obtain a calorie count to see if the patient is adequately taking in her p.o. and then we can safely stop the core track and tube feedings  Volume Overload -in the setting of Aggressive Resuscitation -She is 14 L positive almost -Given IV Lasix 40 mg x 1 but will likely require additional Lasix dosing and will need to reevaluate in a.m.  Normocytic anemia/anemia of critical illness -Hemoglobin dropped down to 6.0 and hematocrit was 20.3 -She is status post 3 units of PRBCs  and hemoglobin/hematocrit is now improved and stabilized.  Hemoglobin/hematocrit this morning is gone from 10.5/34.1 to 10.0/32.8 today -Continue to monitor for signs and symptoms of bleeding; currently no bleeding noted -repeat CBC in a.m.  Hypophosphatemia -Patient's phosphorus level today is 3.3 -Continue to monitor and replete as necessary -Repeat phosphorus level in a.m.  Thrombocytosis -likely reactive and continues to worsen -Patient's platelet count from 210 and is now trending up further and is 641 trending back down is now 587 -Plan continue monitor and trend repeat CBC level in a.m.  Left arm swelling and bruising -In the setting of multiple IVs infiltration of stick  -Continue with warm compresses and elevation: Elevate extremity above the level of  the heart -We will order an ultrasound of the left arm; ultrasound was read as a negative exam -IV Lasix 40 mg x 1 given her continued swelling and pain on palpation  Hyponatremia/hypochloremia -Mild with a sodium of 134 and a chloride level of 97 yesterday and now sodium is 136 and chloride level is 98 -IV Lasix 40 mg x 1 today -Continue to monitor and trend and repeat CMP in a.m.   DVT prophylaxis: SCDs given her recent acute anemia Code Status: FULL CODE Family Communication: No family present at bedside  Disposition Plan: Pending further improvement and clearance by ENT and evaluation by PT and OT anticipate that she will need to go to SNF  Status is: Inpatient  Remains inpatient appropriate because:Unsafe d/c plan, IV treatments appropriate due to intensity of illness or inability to take PO and Inpatient level of care appropriate due to severity of illness   Dispo: The patient is from: Home              Anticipated d/c is to: Home with Home Health PT/OT              Anticipated d/c date is: 2 days              Patient currently is not medically stable to d/c.  Consultants:   ENT  PCCM Transfer   Procedures:  CT Soft Tissue and Neck:1. Trans-spatial gas and fluid containing abscess of the floor of mouth, measuring 4.5 x 4.7 cm.2. Small subperiosteal abscess along the internal surface of the right mandibular body, near an empty posterior molar socket.  7/8 went to the operating room where she underwent incision and drainage of neck abscess, with Penrose drain placed cultures were sen  Antimicrobials:  Anti-infectives (From admission, onward)   Start     Dose/Rate Route Frequency Ordered Stop   05/22/20 1415  cefTRIAXone (ROCEPHIN) 2 g in sodium chloride 0.9 % 100 mL IVPB     Discontinue     2 g 200 mL/hr over 30 Minutes Intravenous Every 24 hours 05/22/20 1411     05/19/20 1700  ampicillin-sulbactam (UNASYN) 1.5 g in sodium chloride 0.9 % 100 mL IVPB  Status:   Discontinued        1.5 g 200 mL/hr over 30 Minutes Intravenous Every 6 hours 05/19/20 1647 05/22/20 1411     Subjective: Seen and examined at bedside and he is complaining of some arm soreness and continues to have swelling in the left arm.  Her main complaint today was nasal congestion and she is wanting to see if the Cortrak.  Diet is being advanced and she is now on a dysphagia diet and will keep the core track in place for now and then assess her caloric intake via p.o.  and then discontinue the Cortrak as necessary.  PT OT evaluated her and she is been recommended for home health PT OT depending on her progress.  Also complains of some neuropathy so we will start her on gabapentin 300 mg p.o. twice daily  Objective: Vitals:   05/31/20 0537 05/31/20 1400 05/31/20 2005 06/01/20 0416  BP: 130/72 (!) 165/68 (!) 144/50 (!) 130/38  Pulse: 93 (!) 107 97 93  Resp: 18 18 17 17   Temp: 98 F (36.7 C) 99 F (37.2 C) 98.5 F (36.9 C) 98.6 F (37 C)  TempSrc: Oral Oral Oral Oral  SpO2: 99% 100% 99% 100%  Weight:      Height:        Intake/Output Summary (Last 24 hours) at 06/01/2020 1247 Last data filed at 06/01/2020 0900 Gross per 24 hour  Intake 4940 ml  Output 1125 ml  Net 3815 ml   Filed Weights   05/24/20 0409 05/25/20 0340 05/27/20 0500  Weight: 67.6 kg 64.3 kg 60.7 kg   Examination: Physical Exam:  Constitutional: The patient is a thin elderly chronically ill-appearing African-American female currently no acute distress appears slightly uncomfortable complaining of some nasal congestion. Eyes: Lids and conjunctivae normal, sclerae anicteric; has some exotropia of the right eye ENMT: External Ears, Nose appear normal. Grossly normal hearing.  Cortrak in Right nare Neck: Appears normal, supple, no cervical masses, normal ROM, no appreciable thyromegaly; no appreciable JVD Respiratory: Diminished to auscultation bilaterally, no wheezing, rales, rhonchi or crackles. Normal  respiratory effort and patient is not tachypenic. No accessory muscle use.  Cardiovascular: RRR, no murmurs / rubs / gallops. S1 and S2 auscultated.  Has some left arm swelling and some pedal edema Abdomen: Soft, non-tender, non-distended. Bowel sounds positive x4 and slightly hyperactive.  GU: Deferred. Musculoskeletal: No clubbing / cyanosis of digits/nails. No joint deformity upper and lower extremities.  Skin: No rashes, lesions, ulcers on limited skin evaluation. No induration; Warm and dry.  Neurologic: CN 2-12 grossly intact with no focal deficits. Romberg sign and cerebellar reflexes not assessed.  Psychiatric: Normal judgment and insight. Alert and oriented x 3.  Somewhat anxious mood and appropriate affect.   Data Reviewed: I have personally reviewed following labs and imaging studies  CBC: Recent Labs  Lab 05/28/20 0242 05/29/20 1459 05/30/20 1030 05/31/20 0315 06/01/20 0352  WBC 13.7* 12.9* 13.8* 15.9* 14.2*  NEUTROABS 9.4* 8.2* 10.2* 11.2* 9.6*  HGB 9.8* 9.7* 10.3* 10.5* 10.0*  HCT 31.7* 31.5* 32.9* 34.1* 32.8*  MCV 93.2 92.4 92.4 93.9 94.3  PLT 581* 562* 627* 641* 916*   Basic Metabolic Panel: Recent Labs  Lab 05/28/20 0930 05/29/20 1459 05/30/20 1030 05/31/20 0315 06/01/20 0352  NA 137 133* 133* 134* 136  K 4.1 4.2 4.5 4.6 4.6  CL 101 96* 96* 97* 98  CO2 28 27 29 30 28   GLUCOSE 290* 286* 359* 209* 216*  BUN 12 14 15 16 19   CREATININE 0.65 0.63 0.64 0.56 0.61  CALCIUM 8.8* 8.9 9.2 9.3 9.2  MG 1.8 1.8 1.8 1.9 1.8  PHOS 2.3* 2.9 3.2 3.6 3.3   GFR: Estimated Creatinine Clearance: 55.1 mL/min (by C-G formula based on SCr of 0.61 mg/dL). Liver Function Tests: Recent Labs  Lab 05/28/20 0930 05/29/20 1459 05/30/20 1030 05/31/20 0315 06/01/20 0352  AST 23 30 26 20 16   ALT 22 31 30 28 22   ALKPHOS 92 89 93 89 79  BILITOT 0.6 0.2* 0.7 0.5 0.5  PROT 6.1* 6.3* 6.5 6.5 6.4*  ALBUMIN 2.3* 2.3* 2.4* 2.6* 2.5*   No results for input(s): LIPASE, AMYLASE in  the last 168 hours. No results for input(s): AMMONIA in the last 168 hours. Coagulation Profile: No results for input(s): INR, PROTIME in the last 168 hours. Cardiac Enzymes: No results for input(s): CKTOTAL, CKMB, CKMBINDEX, TROPONINI in the last 168 hours. BNP (last 3 results) No results for input(s): PROBNP in the last 8760 hours. HbA1C: No results for input(s): HGBA1C in the last 72 hours. CBG: Recent Labs  Lab 05/31/20 2008 06/01/20 0018 06/01/20 0415 06/01/20 0751 06/01/20 1156  GLUCAP 165* 187* 195* 292* 261*   Lipid Profile: No results for input(s): CHOL, HDL, LDLCALC, TRIG, CHOLHDL, LDLDIRECT in the last 72 hours. Thyroid Function Tests: No results for input(s): TSH, T4TOTAL, FREET4, T3FREE, THYROIDAB in the last 72 hours. Anemia Panel: No results for input(s): VITAMINB12, FOLATE, FERRITIN, TIBC, IRON, RETICCTPCT in the last 72 hours. Sepsis Labs: No results for input(s): PROCALCITON, LATICACIDVEN in the last 168 hours.  No results found for this or any previous visit (from the past 240 hour(s)).   RN Pressure Injury Documentation:     Estimated body mass index is 21.6 kg/m as calculated from the following:   Height as of this encounter: 5\' 6"  (1.676 m).   Weight as of this encounter: 60.7 kg.  Malnutrition Type:  Nutrition Problem: Inadequate oral intake Etiology: acute illness, inability to eat   Malnutrition Characteristics:  Signs/Symptoms: NPO status   Nutrition Interventions:  Interventions: Other (Comment)   Radiology Studies: [place] LT UPPER EXTREM LTD SOFT TISSUE NON VASCULAR  Result Date: 05/30/2020 CLINICAL DATA:  Left arm swelling. EXAM: ULTRASOUND LEFT UPPER EXTREMITY LIMITED TECHNIQUE: Ultrasound examination of the upper extremity soft tissues was performed in the area of clinical concern. COMPARISON:  None. FINDINGS: Scanning was directed toward the region of concern in the left antecubital fossa. No fluid collection or mass. No  subcutaneous edema. IMPRESSION: Negative exam. Electronically Signed   By: Inge Rise M.D.   On: 05/30/2020 16:32   Scheduled Meds:  chlorhexidine  15 mL Mouth Rinse BID   Chlorhexidine Gluconate Cloth  6 each Topical Daily   fluticasone  1 spray Each Nare Daily   gabapentin  300 mg Oral BID   insulin aspart  0-20 Units Subcutaneous Q4H   insulin detemir  20 Units Subcutaneous BID   lisinopril  5 mg Oral Daily   magic mouthwash  5 mL Oral QID   mouth rinse  15 mL Mouth Rinse q12n4p   rosuvastatin  10 mg Per Tube Daily   Continuous Infusions:  cefTRIAXone (ROCEPHIN)  IV 2 g (05/31/20 1343)   feeding supplement (OSMOLITE 1.2 CAL) 1,000 mL (06/01/20 0228)    LOS: 13 days   Kerney Elbe, DO [facility] PAGER is on AMION  If 7PM-7AM, please contact night-coverage [email or web address]

## 2020-06-02 DIAGNOSIS — L0211 Cutaneous abscess of neck: Secondary | ICD-10-CM

## 2020-06-02 DIAGNOSIS — N179 Acute kidney failure, unspecified: Secondary | ICD-10-CM | POA: Diagnosis present

## 2020-06-02 DIAGNOSIS — E119 Type 2 diabetes mellitus without complications: Secondary | ICD-10-CM

## 2020-06-02 DIAGNOSIS — I10 Essential (primary) hypertension: Secondary | ICD-10-CM | POA: Diagnosis present

## 2020-06-02 DIAGNOSIS — R6521 Severe sepsis with septic shock: Secondary | ICD-10-CM | POA: Diagnosis present

## 2020-06-02 DIAGNOSIS — R1310 Dysphagia, unspecified: Secondary | ICD-10-CM

## 2020-06-02 LAB — GLUCOSE, CAPILLARY
Glucose-Capillary: 78 mg/dL (ref 70–99)
Glucose-Capillary: 248 mg/dL — ABNORMAL HIGH (ref 70–99)
Glucose-Capillary: 213 mg/dL — ABNORMAL HIGH (ref 70–99)
Glucose-Capillary: 143 mg/dL — ABNORMAL HIGH (ref 70–99)

## 2020-06-02 LAB — COMPREHENSIVE METABOLIC PANEL
ALT: 27 U/L (ref 0–44)
AST: 20 U/L (ref 15–41)
Albumin: 2.7 g/dL — ABNORMAL LOW (ref 3.5–5.0)
Alkaline Phosphatase: 75 U/L (ref 38–126)
Anion gap: 9 (ref 5–15)
BUN: 24 mg/dL — ABNORMAL HIGH (ref 8–23)
CO2: 27 mmol/L (ref 22–32)
Calcium: 9.4 mg/dL (ref 8.9–10.3)
Chloride: 101 mmol/L (ref 98–111)
Creatinine, Ser: 0.72 mg/dL (ref 0.44–1.00)
GFR calc Af Amer: >60 mL/min (ref >60)
GFR calc non Af Amer: >60 mL/min (ref >60)
Glucose, Bld: 70 mg/dL (ref 70–99)
Potassium: 4.1 mmol/L (ref 3.5–5.1)
Sodium: 137 mmol/L (ref 135–145)
Total Bilirubin: 0.6 mg/dL (ref 0.3–1.2)
Total Protein: 6.9 g/dL (ref 6.5–8.1)

## 2020-06-02 LAB — CBC WITH DIFFERENTIAL/PLATELET
Abs Immature Granulocytes: 0.06 10*3/uL (ref 0.00–0.07)
Basophils Absolute: 0.1 10*3/uL (ref 0.0–0.1)
Basophils Relative: 1 %
Eosinophils Absolute: 0.2 10*3/uL (ref 0.0–0.5)
Eosinophils Relative: 1 %
HCT: 33.1 % — ABNORMAL LOW (ref 36.0–46.0)
Hemoglobin: 10.1 g/dL — ABNORMAL LOW (ref 12.0–15.0)
Immature Granulocytes: 0 %
Lymphocytes Relative: 24 %
Lymphs Abs: 3.4 10*3/uL (ref 0.7–4.0)
MCH: 28.2 pg (ref 26.0–34.0)
MCHC: 30.5 g/dL (ref 30.0–36.0)
MCV: 92.5 fL (ref 80.0–100.0)
Monocytes Absolute: 1.7 10*3/uL — ABNORMAL HIGH (ref 0.1–1.0)
Monocytes Relative: 12 %
Neutro Abs: 8.9 10*3/uL — ABNORMAL HIGH (ref 1.7–7.7)
Neutrophils Relative %: 62 %
Platelets: 584 10*3/uL — ABNORMAL HIGH (ref 150–400)
RBC: 3.58 MIL/uL — ABNORMAL LOW (ref 3.87–5.11)
RDW: 14.2 % (ref 11.5–15.5)
WBC: 14.3 10*3/uL — ABNORMAL HIGH (ref 4.0–10.5)
nRBC: 0 % (ref 0.0–0.2)

## 2020-06-02 LAB — PHOSPHORUS: Phosphorus: 4.1 mg/dL (ref 2.5–4.6)

## 2020-06-02 LAB — MAGNESIUM: Magnesium: 1.8 mg/dL (ref 1.7–2.4)

## 2020-06-02 MED ORDER — INSULIN DETEMIR 100 UNIT/ML ~~LOC~~ SOLN
5.0000 [IU] | Freq: Once | SUBCUTANEOUS | Status: AC
  Administered 2020-06-02: 5 [IU] via SUBCUTANEOUS
  Filled 2020-06-02 (×2): qty 0.05

## 2020-06-02 NOTE — Plan of Care (Signed)
  Problem: Education: Goal: Knowledge of General Education information will improve Description: Including pain rating scale, medication(s)/side effects and non-pharmacologic comfort measures 06/02/2020 1331 by Joni Reining, RN Outcome: Progressing 06/02/2020 1331 by Joni Reining, RN Outcome: Progressing   Problem: Health Behavior/Discharge Planning: Goal: Ability to manage health-related needs will improve 06/02/2020 1331 by Joni Reining, RN Outcome: Progressing 06/02/2020 1331 by Joni Reining, RN Outcome: Progressing   Problem: Clinical Measurements: Goal: Will remain free from infection 06/02/2020 1331 by Joni Reining, RN Outcome: Progressing 06/02/2020 1331 by Joni Reining, RN Outcome: Progressing   Problem: Activity: Goal: Risk for activity intolerance will decrease 06/02/2020 1331 by Joni Reining, RN Outcome: Progressing 06/02/2020 1331 by Joni Reining, RN Outcome: Progressing   Problem: Nutrition: Goal: Adequate nutrition will be maintained Outcome: Progressing  Dysphagia precautions reviewed extensively with aptient, mouth care provided frequently. Patient wstill with poor intake, calorie count initiated.

## 2020-06-02 NOTE — Progress Notes (Signed)
Physical Therapy Treatment Patient Details Name: Katherine [name] MRN: [id number] DOB: 08-29-1941 Today's Date: 06/02/2020    History of Present Illness 79 year old African-American female s/p recent tooth extraction with orofacial pain concerning for Ludwig's angina, hypotension and airway compromise requiring intubation 7/7, tranfusions of RBCs and 7/9 I&D of neck abcess, and self extubated 7/13. PMHx: HTN, HLD, T2DM, essential tremor, exotropia of right eye, glaucoma, incomplete right bundle branch block, left anterior fascicular block, stress urinary incontinence,    PT Comments    Pt progressing well towards her physical therapy goals, as evidenced by improved activity tolerance and ambulation distance. Pt ambulating 100 feet with a walker at a min guard assist level. Continues with balance deficits, weakness and decreased endurance. D/c plan remains appropriate.     Follow Up Recommendations  Home health PT;Supervision for mobility/OOB     Equipment Recommendations  Rolling walker with 5" wheels    Recommendations for Other Services       Precautions / Restrictions Precautions Precautions: Fall Restrictions Weight Bearing Restrictions: No    Mobility  Bed Mobility Overal bed mobility: Needs Assistance Bed Mobility: Sit to Supine     Supine to sit: Min guard     General bed mobility comments: increased time and effort  Transfers Overall transfer level: Needs assistance Equipment used: Rolling walker (2 wheeled) Transfers: Sit to/from Stand Sit to Stand: Min guard         General transfer comment: Min guard to rise from edge of bed  Ambulation/Gait Ambulation/Gait assistance: Min guard Gait Distance (Feet): 100 Feet Assistive device: Rolling walker (2 wheeled) Gait Pattern/deviations: Step-through pattern;Decreased stride length;Narrow base of support Gait velocity: decreased   General Gait Details: Cues for wider BOS, walker proximity, min guard for  safety   Stairs             Wheelchair Mobility    Modified Rankin (Stroke Patients Only)       Balance Overall balance assessment: Needs assistance Sitting-balance support: Feet supported;No upper extremity supported Sitting balance-Leahy Scale: Good     Standing balance support: During functional activity;Bilateral upper extremity supported Standing balance-Leahy Scale: Poor Standing balance comment: reliant on external support from RW or sink to maintain standing balance                            Cognition Arousal/Alertness: Awake/alert Behavior During Therapy: WFL for tasks assessed/performed Overall Cognitive Status: Within Functional Limits for tasks assessed                                 General Comments: appears [facility] this session. Able to recall events of the day, is motivated to participate in therapy      Exercises Other Exercises Other Exercises: standing sink push ups x5; standing OH reaches past mirror at sink x5 each arm; standing squats while hanging onto sink x10    General Comments        Pertinent Vitals/Pain Pain Assessment: No/denies pain    Home Living                      Prior Function            PT Goals (current goals can now be found in the care plan section) Acute Rehab PT Goals Patient Stated Goal: to get back to independence and not rely on anyone else Potential  to Achieve Goals: Good Progress towards PT goals: Progressing toward goals    Frequency    Min 3X/week      PT Plan Current plan remains appropriate    Co-evaluation              AM-PAC PT "6 Clicks" Mobility   Outcome Measure  Help needed turning from your back to your side while in a flat bed without using bedrails?: None Help needed moving from lying on your back to sitting on the side of a flat bed without using bedrails?: A Little Help needed moving to and from a bed to a chair (including a wheelchair)?: A  Little Help needed standing up from a chair using your arms (e.g., wheelchair or bedside chair)?: A Little Help needed to walk in hospital room?: A Little Help needed climbing 3-5 steps with a railing? : A Lot 6 Click Score: 18    End of Session Equipment Utilized During Treatment: Gait belt Activity Tolerance: Patient tolerated treatment well Patient left: in bed;with call bell/phone within reach;with bed alarm set Nurse Communication: Mobility status PT Visit Diagnosis: Muscle weakness (generalized) (M62.81);Unsteadiness on feet (R26.81)     Time: 1551-1610 PT Time Calculation (min) (ACUTE ONLY): 19 min  Charges:  $Gait Training: 8-22 mins $Therapeutic Exercise: 8-22 mins                       Wyona Almas, PT, DPT Acute Rehabilitation Services Pager [phone] Office [phone]    Deno Etienne 06/02/2020, 5:12 PM

## 2020-06-02 NOTE — Progress Notes (Addendum)
Calorie Count Note  48 hour calorie count ordered.  Diet: dysphagia 1 with thin lquids Supplements: Glucerna Shake po TID, each supplement provides 220 kcal and 10 grams of protein; Magic cup TID with meals, each supplement provides 290 kcal and 9 grams of protein; MVI with minerals daily  Case discussed with RN, who reports pt is tolerating current texture well and is liking Glucerna shakes.   Spoke ith pt at bedside, who reports that she is feeling better now that cortrak has been removed ("I feel more comfortable and I can finally breathe"). She is excited to be on a diet. She really likes ice cream, chocolate pudding, and Glucerna shakes. Discussed importance of good meal and supplement intake to promote healing.  7/20 Breakfast: 745 kcals, 24 grams protein Lunch: 492 kcals, 25 grams protein Dinner: 578 kcals, 24 grams protein  Total intake: 1815 kcal (100% of minimum estimated needs)  73 grams protein (86% of minimum estimated needs)  Nutrition-Focused Physical   Most Recent Value  Orbital Region No depletion  Upper Arm Region No depletion  Thoracic and Lumbar Region No depletion  Buccal Region No depletion  Temple Region No depletion  Clavicle Bone Region No depletion  Clavicle and Acromion Bone Region No depletion  Scapular Bone Region No depletion  Dorsal Hand No depletion  Patellar Region No depletion  Anterior Thigh Region No depletion  Posterior Calf Region No depletion  Edema (RD Assessment) None  Hair Reviewed  Eyes Reviewed  Mouth Reviewed  Skin Reviewed  Nails Reviewed      Nutrition Dx: Inadequate oral intake related to acute illness, inability to eat as evidenced by NPO status; progressing   Goal: Patient will meet greater than or equal to 90% of their needs; progressing   Intervention:  -Continue calorie count -Continue MVI with minerals daily -Continue Glucerna Shake po TID, each supplement provides 220 kcal and 10 grams of protein -Continue  Magic cup TID with meals, each supplement provides 290 kcal and 9 grams of protein  Loistine Chance, RD, LDN, [place] Registered Dietitian II Certified Diabetes Care and Education Specialist Please refer to [facility] for RD and/or RD on-call/weekend/after hours pager

## 2020-06-02 NOTE — TOC Initial Note (Signed)
Transition of Care [facility]) - Initial/Assessment Note    Patient Details  Name: Aslynn Brunetti MRN: [id number] Date of Birth: 1941-04-12  Transition of Care [facility]) CM/SW Contact:    Marilu Favre, RN Phone Number: 06/02/2020, 4:49 PM  Clinical Narrative:                 Patient from home with husband. Spoke to patient and husband at bedside.   Discussed PT/OT recommendations for HHPT/OT and 24 hour supervision at home. Patient and husband both voiced understanding. Husband asked when discharge will be. Per MD note and patient progression possible in a day or two.   Provided Medicare.gov list of home health agencies . Discussed insurance does not cover 24/7 supervision. Patient and husband will discuss home health and supervision this evening. NCM will follow up tomorrow, [place] requesting in afternoon.   Expected Discharge Plan: [place] Barriers to Discharge: Continued Medical Work up   Patient Goals and CMS Choice Patient states their goals for this hospitalization and ongoing recovery are:: to return to home CMS Medicare.gov Compare Post Acute Care list provided to:: Patient Choice offered to / list presented to : Patient, Spouse  Expected Discharge Plan and Services Expected Discharge Plan: [place] In-house Referral: Clinical Social Work Discharge Planning Services: CM Consult Post Acute Care Choice: [place], [place] Living arrangements for the past 2 months: Single Family Home                                      Prior Living Arrangements/Services Living arrangements for the past 2 months: Single Family Home Lives with:: Spouse Patient language and need for interpreter reviewed:: Yes Do you feel safe going back to the place where you live?: Yes      Need for Family Participation in Patient Care: Yes (Comment) Care giver support system in place?: Yes (comment) Current home  services: DME Criminal Activity/Legal Involvement Pertinent to Current Situation/Hospitalization: No - Comment as needed  Activities of Daily Living      Permission Sought/Granted Permission sought to share information with : Family Supports Permission granted to share information with : Yes, Verbal Permission Granted  Share Information with NAME: husband Chilton [place]     Permission granted to share info w Relationship: husband  Permission granted to share info w Contact Information: [phone]  Emotional Assessment Appearance:: Appears stated age Attitude/Demeanor/Rapport: Engaged Affect (typically observed): Accepting Orientation: : Oriented to Self, Oriented to Place, Oriented to  Time, Oriented to Situation Alcohol / Substance Use: Not Applicable Psych Involvement: No (comment)  Admission diagnosis:  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 Hypertension   . Type 2 diabetes mellitus ([place])   . Dysphagia   . Severe sepsis with septic shock ([place])   . AKI (acute kidney injury) ([place])   . Acute on chronic respiratory failure with hypoxia ([place])   . 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 Neck abscess 05/19/2020  . Essential tremor 09/25/2019  . Dizziness 02/28/2019  . Mixed hyperlipidemia 02/28/2019  . Lacunar stroke ([place]) 02/28/2019  . Lightheadedness 02/28/2019   PCP:  Lucianne Lei, MD Pharmacy:   [facility] [address], [place] -  [address] [place] [address] Phone: [phone] Fax: [place], [place] - [address] [place] [address] Phone: [phone] Fax:  [phone]     Social Determinants of Health (SDOH) Interventions    Readmission Risk Interventions Readmission Risk Prevention Plan 05/31/2020  Post Dischage Appt Not Complete  Appt Comments pending disposition  Medication Screening Complete  Transportation Screening Complete  Some recent data might be hidden

## 2020-06-02 NOTE — Plan of Care (Signed)

## 2020-06-02 NOTE — Progress Notes (Signed)
  Speech Language Pathology Treatment: Dysphagia  Patient Details Name: Katherine [name] MRN: [id number] DOB: 1941-05-15 Today's Date: 06/02/2020 Time: 9747-1855 SLP Time Calculation (min) (ACUTE ONLY): 13 min  Assessment / Plan / Recommendation Clinical Impression  Pt eager to eat solid textures today. She does masticate with gums, also softens her food orally for a moment. No difficulty observed with any texture. Pt no longer has multiple swallows. She is interested in putting her dentures. With a brief trial she reports no pain, but given abscess resulting after tooth extraction, pt may need clearance from MD to wear her dentures. Overall, there are no further acute SLP needs as current texture is appropriate for an edentulous pt.   HPI HPI: 79yo female admitted 05/19/20 with facial swelling and pain. PMH: HTN, HLD, DM2, recent tooth extraction followed by upper airway obstruction from submandibular abscess. Pt was intubated 7/7 - 13/21, with self extubation Pt has started on a clear liquid diet and using feeding tube for nutrition. Follow up indicated to determine tolerance.      SLP Plan  All goals met       Recommendations  Diet recommendations: Dysphagia 3 (mechanical soft);Thin liquid Liquids provided via: Cup;Straw Medication Administration: Whole meds with liquid Supervision: Patient able to self feed Postural Changes and/or Swallow Maneuvers: Seated upright 90 degrees                Follow up Recommendations: None Plan: All goals met       GO               Herbie [place], MA CCC-SLP  Acute Rehabilitation Services Pager [phone] Office [phone]  Lynann Beaver 06/02/2020, 1:47 PM

## 2020-06-02 NOTE — Progress Notes (Signed)
Occupational Therapy Treatment Patient Details Name: Katherine [name] MRN: [id number] DOB: 1941/05/30 Today's Date: 06/02/2020    History of present illness 79 year old African-American female s/p recent tooth extraction with orofacial pain concerning for Ludwig's angina, hypotension and airway compromise requiring intubation 7/7, tranfusions of RBCs and 7/9 I&D of neck abcess, and self extubated 7/13. PMHx: HTN, HLD, T2DM, essential tremor, exotropia of right eye, glaucoma, incomplete right bundle branch block, left anterior fascicular block, stress urinary incontinence,   OT comments  Pt progressing well toward stated OT goals this date. Pt engaged in lotion application of UB/LB sitting EOB to simulate bathing. Pt then completed functional mobility with min A and RW to sink to complete x1 grooming task. She was able to maintain standing to then complete sink push ups, hand supported squats, and bil OH reaches (described below) without seated rest break. Overall pt remained standing ~10 minutes with hand support to complete functional activities with min guard assist. D/c recs remain appropriate, will continue to follow.    Follow Up Recommendations  Home health OT;Supervision/Assistance - 24 hour    Equipment Recommendations  3 in 1 bedside commode    Recommendations for Other Services      Precautions / Restrictions Precautions Precautions: Fall Restrictions Weight Bearing Restrictions: No       Mobility Bed Mobility Overal bed mobility: Needs Assistance Bed Mobility: Supine to Sit     Supine to sit: Min guard     General bed mobility comments: increased time and effort  Transfers Overall transfer level: Needs assistance Equipment used: Rolling walker (2 wheeled) Transfers: Sit to/from Stand Sit to Stand: Min assist;+2 safety/equipment         General transfer comment: min A +2 for initial stand from EOB for safety and correct hand placement. Progressing to min A with  RW from seat with hand rails    Balance Overall balance assessment: Needs assistance Sitting-balance support: Feet supported;No upper extremity supported Sitting balance-Leahy Scale: Good     Standing balance support: During functional activity Standing balance-Leahy Scale: Poor Standing balance comment: reliant on external support from RW or sink to maintain standing balance                           ADL either performed or assessed with clinical judgement   ADL Overall ADL's : Needs assistance/impaired         Upper Body Bathing: Set up;Sitting Upper Body Bathing Details (indicate cue type and reason): simulated with lotion application Lower Body Bathing: Min guard;Sit to/from stand Lower Body Bathing Details (indicate cue type and reason): sitting EOB to rub lotion on upper and lower legs         Toilet Transfer: Minimal assistance;Ambulation;Regular Toilet;Grab bars;RW Armed forces technical officer Details (indicate cue type and reason): min A to power up and for safe descent         Functional mobility during ADLs: Minimal assistance;Rolling walker;Cueing for safety General ADL Comments: pt able to progress in standing tolerance for BADL and functional exercise at sink     Vision Baseline Vision/History: Wears glasses Wears Glasses: At all times Patient Visual Report: No change from baseline     Perception     Praxis      Cognition Arousal/Alertness: Awake/alert Behavior During Therapy: WFL for tasks assessed/performed Overall Cognitive Status: Within Functional Limits for tasks assessed  General Comments: appears WFL this session. Able to recall events of the day, is motivated to participate in therapy        Exercises Other Exercises Other Exercises: standing sink push ups x5; standing OH reaches past mirror at sink x5 each arm; standing squats while hanging onto sink x10   Shoulder Instructions        General Comments      Pertinent Vitals/ Pain       Pain Assessment: No/denies pain  Home Living                                          Prior Functioning/Environment              Frequency  Min 2X/week        Progress Toward Goals  OT Goals(current goals can now be found in the care plan section)  Progress towards OT goals: Progressing toward goals  Acute Rehab OT Goals Patient Stated Goal: to get back to independence and not rely on anyone else OT Goal Formulation: With patient Time For Goal Achievement: 06/14/20 Potential to Achieve Goals: Good  Plan Discharge plan remains appropriate    Co-evaluation                 AM-PAC OT "6 Clicks" Daily Activity     Outcome Measure   Help from another person eating meals?: None Help from another person taking care of personal grooming?: A Little Help from another person toileting, which includes using toliet, bedpan, or urinal?: A Little Help from another person bathing (including washing, rinsing, drying)?: A Little Help from another person to put on and taking off regular upper body clothing?: A Little Help from another person to put on and taking off regular lower body clothing?: A Lot 6 Click Score: 18    End of Session Equipment Utilized During Treatment: Gait belt;Rolling walker  OT Visit Diagnosis: Unsteadiness on feet (R26.81);Muscle weakness (generalized) (M62.81)   Activity Tolerance Patient tolerated treatment well   Patient Left in bed;Other (comment) (sitting EOB while PT first arrived)   Nurse Communication Mobility status        Time: 3016-0109 OT Time Calculation (min): 23 min  Charges: OT General Charges $OT Visit: 1 Visit OT Treatments $Self Care/Home Management : 8-22 mins $Therapeutic Activity: 8-22 mins  Zenovia Jarred, MSOT, OTR/L Acute Rehabilitation Services [facility] Office Number: [phone] Pager: [phone]  Zenovia Jarred 06/02/2020, 4:22  PM

## 2020-06-02 NOTE — Progress Notes (Addendum)
PROGRESS NOTE  Katherine [name] EVO:[id number] DOB: 1941/06/21 DOA: 05/19/2020 PCP: Lucianne Lei, MD  HPI/Recap of past 23 hours: 79 year old African-American female with a past medical history significant for diabetes mellitus, hypertension and incomplete right bundle branch block who had recently underwent CT tooth extraction and then presented on 7/7 with orofacial pain concerning for Ludwig's angina (rare skin infection occurring in the oral cavity, underneath the tongue often from a tooth abscess) and secondary sepsis.  Patient was intubated and placed on pressor support for hypotension.  Seen by ENT and underwent I&D.  By 7/9, pressors able to be weaned off.  Due to upper airway swelling, she was kept on ventilator.  PICC line placed on 7/11 due to poor IV access.  On 7/13, patient self extubated and was then kept on nasal cannula.  She had a low hemoglobin and was transfused 2 units packed red blood cells.  Transferred to hospitalist service 5 7/16.  Seen by speech therapy who put her on clear liquid diet which is slowly been able to be advanced.   Patient doing okay.  She is frustrated being on a dysphagia 1 diet.  She also wants to continue with physical therapy.    Assessment/Plan: Principal Problem: Sepsis secondary to Ludwig's angina causing acute on chronic respiratory failure with hypoxia [facility]): Status post intubation.  Patient met criteria for sepsis on admission given leukocytosis, hypoxia with acute kidney injury, respiratory failure I & D source being from oral infection/Ludwig's angina.  Sepsis has since resolved and been treated.  Continuing IV antibiotics.  Status post I&D of neck abscess.  Now off ventilator.  Working with speech therapy to improve dysphagia.  14 days of IV Rocephin finished on 7/14.  Noted mild persistent leukocytosis, continue to monitor closely Active Problems:    Hypertension: Blood pressure stable.    Type 2 diabetes mellitus ([place]): Following  CBGs.  Acute kidney injury: Secondary to sepsis.  Has since resolved.    Code Status: Full code  Family Communication: Left message for family  Disposition Plan: Home with home health PT OT 24-hour supervision can be confirmed.  Anticipate discharge in the next few days as diet advances, anticipate discharge on 7/22 or 7/23.   Consultants:  ENT  Critical care  Procedures:  Status post I&D of oral infection  Status post I&D of neck abscess  PICC line placed 7/11  Ventilator support 7/7-7/13  Antimicrobials:  IV Unasyn 7/7-7/10  IV Rocephin 7/10-7/23  DVT prophylaxis: SCDs   Objective: Vitals:   06/02/20 1000 06/02/20 1331  BP: (!) 121/47 (!) 116/55  Pulse: 98 100  Resp: 18 19  Temp: 98.2 F (36.8 C) 98.5 F (36.9 C)  SpO2: 99% 100%    Intake/Output Summary (Last 24 hours) at 06/02/2020 1609 Last data filed at 06/02/2020 0830 Gross per 24 hour  Intake 690 ml  Output 950 ml  Net -260 ml   Filed Weights   05/24/20 0409 05/25/20 0340 05/27/20 0500  Weight: 67.6 kg 64.3 kg 60.7 kg   Body mass index is 21.6 kg/m.  Exam:   General: Alert and oriented x3, no acute distress  HEENT: Normocephalic.  Neck abscess bandaged.  Mucous membranes are moist  Cardiovascular: Regular rate and rhythm, S1-S2  Respiratory: Clear to auscultation bilaterally  Abdomen: Soft, nontender, nondistended, positive bowel sounds  Musculoskeletal: No clubbing or cyanosis, trace pitting edema  Psychiatry: Appropriate, no evidence of psychoses   Data Reviewed: CBC: Recent Labs  Lab 05/29/20 1459 05/30/20 1030  05/31/20 0315 06/01/20 0352 06/02/20 0431  WBC 12.9* 13.8* 15.9* 14.2* 14.3*  NEUTROABS 8.2* 10.2* 11.2* 9.6* 8.9*  HGB 9.7* 10.3* 10.5* 10.0* 10.1*  HCT 31.5* 32.9* 34.1* 32.8* 33.1*  MCV 92.4 92.4 93.9 94.3 92.5  PLT 562* 627* 641* 587* 277*   Basic Metabolic Panel: Recent Labs  Lab 05/29/20 1459 05/30/20 1030 05/31/20 0315 06/01/20 0352  06/02/20 0431  NA 133* 133* 134* 136 137  K 4.2 4.5 4.6 4.6 4.1  CL 96* 96* 97* 98 101  CO2 _0 GLUCOSE 286* 359* 209* 216* 70  BUN _1 24*  CREATININE 0.63 0.64 0.56 0.61 0.72  CALCIUM 8.9 9.2 9.3 9.2 9.4  MG 1.8 1.8 1.9 1.8 1.8  PHOS 2.9 3.2 3.6 3.3 4.1   GFR: Estimated Creatinine Clearance: 55.1 mL/min (by C-G formula based on SCr of 0.72 mg/dL). Liver Function Tests: Recent Labs  Lab 05/29/20 1459 05/30/20 1030 05/31/20 0315 06/01/20 0352 06/02/20 0431  AST _2 ALT _3 ALKPHOS 89 93 89 79 75  BILITOT 0.2* 0.7 0.5 0.5 0.6  PROT 6.3* 6.5 6.5 6.4* 6.9  ALBUMIN 2.3* 2.4* 2.6* 2.5* 2.7*   No results for input(s): LIPASE, AMYLASE in the last 168 hours. No results for input(s): AMMONIA in the last 168 hours. Coagulation Profile: No results for input(s): INR, PROTIME in the last 168 hours. Cardiac Enzymes: No results for input(s): CKTOTAL, CKMB, CKMBINDEX, TROPONINI in the last 168 hours. BNP (last 3 results) No results for input(s): PROBNP in the last 8760 hours. HbA1C: No results for input(s): HGBA1C in the last 72 hours. CBG: Recent Labs  Lab 06/01/20 1614 06/01/20 1739 06/01/20 2117 06/02/20 0746 06/02/20 1158  GLUCAP 72 81 179* 78 248*   Lipid Profile: No results for input(s): CHOL, HDL, LDLCALC, TRIG, CHOLHDL, LDLDIRECT in the last 72 hours. Thyroid Function Tests: No results for input(s): TSH, T4TOTAL, FREET4, T3FREE, THYROIDAB in the last 72 hours. Anemia Panel: No results for input(s): VITAMINB12, FOLATE, FERRITIN, TIBC, IRON, RETICCTPCT in the last 72 hours. Urine analysis:    Component Value Date/Time   COLORURINE YELLOW 06/11/2009 1624   APPEARANCEUR CLEAR 06/11/2009 1624   LABSPEC 1.036 (H) 06/11/2009 1624   PHURINE 5.5 06/11/2009 1624   GLUCOSEU 500 (A) 06/11/2009 1624   HGBUR NEGATIVE 06/11/2009 1624   BILIRUBINUR SMALL (A) 06/11/2009 1624   KETONESUR NEGATIVE 06/11/2009 1624   PROTEINUR NEGATIVE  06/11/2009 1624   UROBILINOGEN 1.0 06/11/2009 1624   NITRITE NEGATIVE 06/11/2009 1624   LEUKOCYTESUR  06/11/2009 1624    NEGATIVE MICROSCOPIC NOT DONE ON URINES WITH NEGATIVE PROTEIN, BLOOD, LEUKOCYTES, NITRITE, OR GLUCOSE <1000 mg/dL.   Sepsis Labs: _4 (procalcitonin:4,lacticidven:4)  )No results found for this or any previous visit (from the past 240 hour(s)).    Studies: No results found.  Scheduled Meds: . chlorhexidine  15 mL Mouth Rinse BID  . Chlorhexidine Gluconate Cloth  6 each Topical Daily  . feeding supplement (GLUCERNA SHAKE)  237 mL Oral TID BM  . fluticasone  1 spray Each Nare Daily  . gabapentin  300 mg Oral BID  . insulin aspart  0-15 Units Subcutaneous TID WC  . insulin detemir  20 Units Subcutaneous BID  . lisinopril  5 mg Oral Daily  . magic mouthwash  5 mL Oral QID  . mouth rinse  15 mL Mouth Rinse q12n4p  . multivitamin with minerals  1 tablet Oral  Daily  . rosuvastatin  10 mg Per Tube Daily    Continuous Infusions: . cefTRIAXone (ROCEPHIN)  IV 2 g (06/02/20 1402)     LOS: 14 days     Annita Brod, MD [facility]   06/02/2020, 4:09 PM

## 2020-06-03 DIAGNOSIS — Z794 Long term (current) use of insulin: Secondary | ICD-10-CM

## 2020-06-03 DIAGNOSIS — J9621 Acute and chronic respiratory failure with hypoxia: Secondary | ICD-10-CM

## 2020-06-03 DIAGNOSIS — I10 Essential (primary) hypertension: Secondary | ICD-10-CM

## 2020-06-03 DIAGNOSIS — E1165 Type 2 diabetes mellitus with hyperglycemia: Secondary | ICD-10-CM

## 2020-06-03 LAB — GLUCOSE, CAPILLARY
Glucose-Capillary: 80 mg/dL (ref 70–99)
Glucose-Capillary: 284 mg/dL — ABNORMAL HIGH (ref 70–99)
Glucose-Capillary: 401 mg/dL — ABNORMAL HIGH (ref 70–99)
Glucose-Capillary: 206 mg/dL — ABNORMAL HIGH (ref 70–99)
Glucose-Capillary: 181 mg/dL — ABNORMAL HIGH (ref 70–99)

## 2020-06-03 MED ORDER — INSULIN STARTER KIT- PEN NEEDLES (ENGLISH)
1.0000 | Freq: Once | Status: DC
  Filled 2020-06-03: qty 1

## 2020-06-03 NOTE — Progress Notes (Addendum)
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 PMHx: HTN, HLD, T2DM, essential tremor, exotropia of right eye, glaucoma, incomplete right bundle branch block, left anterior fascicular block, stress urinary incontinence,    PT Comments    Session focused on stair training to prepare for upcoming discharge home. Pt negotiated x 4 steps with right railing and then x 2 steps with a walker to simulate home set up at a min assist level. Provided written handout with pictures and description to show spouse to provide necessary assist. D/c plan remains appropriate.     Follow Up Recommendations  Home health PT;Supervision for mobility/OOB     Equipment Recommendations  Rolling walker with 5" wheels    Recommendations for Other Services       Precautions / Restrictions Precautions Precautions: Fall Restrictions Weight Bearing Restrictions: No    Mobility  Bed Mobility Overal bed mobility: Needs Assistance Bed Mobility: Sit to Supine;Supine to Sit     Supine to sit: Supervision Sit to supine: Supervision   General bed mobility comments: increased time and effort  Transfers Overall transfer level: Needs assistance Equipment used: Rolling walker (2 wheeled) Transfers: Sit to/from Stand Sit to Stand: Min guard         General transfer comment: Min guard to rise from edge of bed and recliner. Cues for hand placement  Ambulation/Gait Ambulation/Gait assistance: Supervision Gait Distance (Feet): 10 Feet Assistive device: Rolling walker (2 wheeled) Gait Pattern/deviations: Step-through pattern;Decreased stride length;Narrow base of  support Gait velocity: decreased   General Gait Details: Transitional steps from bed <> chair and chair <> steps   Stairs Stairs: Yes Stairs assistance: Min assist Stair Management: One rail Right;No rails;With walker;Backwards;Forwards Number of Stairs: 6 General stair comments: Practiced x 4 steps with R railing and L HHA and x 2 steps with walker ascending backwards and descending forwards. Pt requiring minA for stability, cues for sequencing and step by step pattern   Wheelchair Mobility    Modified [place] (Stroke Patients Only)       Balance Overall balance assessment: Needs assistance Sitting-balance support: Feet supported;No upper extremity supported Sitting balance-Leahy Scale: Good     Standing balance support: During functional activity;Bilateral upper extremity supported Standing balance-Leahy Scale: Poor Standing balance comment: reliant on external support from RW or sink to maintain standing balance                            Cognition Arousal/Alertness: Awake/alert Behavior During Therapy: WFL for tasks assessed/performed Overall Cognitive Status: Within Functional Limits for tasks assessed                                        Exercises      General Comments        Pertinent Vitals/Pain Pain Assessment: No/denies pain    Home Living                      Prior Function            PT Goals (current goals can now be found  in the care plan section) Acute Rehab PT Goals Patient Stated Goal: to get back to independence and not rely on anyone else Potential to Achieve Goals: Good Progress towards PT goals: Progressing toward goals    Frequency    Min 3X/week      PT Plan Current plan remains appropriate    Co-evaluation              AM-PAC PT "6 Clicks" Mobility   Outcome Measure  Help needed turning from your back to your side while in a flat bed without using bedrails?: None Help needed  moving from lying on your back to sitting on the side of a flat bed without using bedrails?: A Little Help needed moving to and from a bed to a chair (including a wheelchair)?: A Little Help needed standing up from a chair using your arms (e.g., wheelchair or bedside chair)?: A Little Help needed to walk in hospital room?: A Little Help needed climbing 3-5 steps with a railing? : A Little 6 Click Score: 19    End of Session Equipment Utilized During Treatment: Gait belt Activity Tolerance: Patient tolerated treatment well Patient left: in bed;with call bell/phone within reach;with nursing/sitter in room Nurse Communication: Mobility status PT Visit Diagnosis: Muscle weakness (generalized) (M62.81);Unsteadiness on feet (R26.81)     Time: 4235-3614 PT Time Calculation (min) (ACUTE ONLY): 31 min  Charges:  $Gait Training: 23-37 mins                       Wyona Almas, [place], DPT Acute Rehabilitation Services Pager [phone] Office [phone]    Deno Etienne 06/03/2020, 1:14 PM

## 2020-06-03 NOTE — TOC Initial Note (Addendum)
Transition of Care [facility]) - Initial/Assessment Note    Patient Details  Name: Katherine [name] MRN: [id number] Date of Birth: 1941-02-04  [facility]) CM/SW Contact:    Marilu Favre, RN Phone Number: 06/03/2020, 10:34 AM  Clinical Narrative:                 Received a message from bedside nurse that MD plans discharge today. Spoke to patient at bedside and called husband Chilton Greathouse [phone] and placed on speaker phone.   Discussed PT /OT rec home health and supervision for ambulation, 3 in 1 and walker.. Explained discharge would be today. Patient has steps to enter home and she is not sure she can enter. Discussed PT[facility] transportation home. PTAR will file with insurance. NCM confirmed address, at this time patient unsure regarding ambulance transport.   Discussed PTAR is unable to transport 3 in 1 and walker. Husband plans to come to hospital for dressing changes prior to discharge and he will transport DME home.    Discussed home health agency. They have not decided. Patient has medicare.gov list, husband told patient to pick an agency. Patient would like [facility]. NCM called [facility] and left message awaiting call back. Will need home health orders and face to face.  Tommi Rumps with [facility] has accepted referral for HHRN,PT,OT  Bedside nurse aware and will call husband to arrange a time for teaching.    UPDATE Dr Darrick Meigs wants patient to stay for 2 more days of IV ABX and discharge on 06/05/20. Patient , husband , nurse and [facility] aware.   Expected Discharge Plan: [place] Barriers to Discharge: Continued Medical Work up   Patient Goals and CMS Choice Patient states their goals for this hospitalization and ongoing recovery are:: to return to home CMS Medicare.gov Compare Post Acute Care list provided to:: Patient Choice offered to / list presented to : Patient, Spouse  Expected Discharge Plan and Services Expected Discharge Plan: [place] In-house Referral: Clinical Social Work Discharge Planning Services: CM Consult Post Acute Care Choice: Home Health, Durable Medical Equipment Living arrangements for the past 2 months: [place]                 DME Arranged: 3-N-1, Walker rolling DME Agency: [employer] Date DME Agency Contacted: 06/03/20 Time DME Agency Contacted: [phone] Representative spoke with at DME Agency: [place] Arranged: OT, PT [place] Agency: [place] Date [place]: 06/03/20 Time [place]: 1033 Representative spoke with at [place]: Tommi Rumps awaiting call back  Prior Living Arrangements/Services Living arrangements for the past 2 months: Single Family Home Lives with:: Spouse Patient language and need for interpreter reviewed:: Yes Do you feel safe going back to the place where you live?: Yes      Need for Family Participation in Patient Care: Yes (Comment) Care giver support system in place?: Yes (comment) Current home services: DME Criminal Activity/Legal Involvement Pertinent to Current Situation/Hospitalization: No - Comment as needed  Activities of Daily Living      Permission Sought/Granted Permission sought to share information with : Family Supports Permission granted to share information with : Yes, Verbal Permission Granted  Share Information with NAME: Husband Chilton Greathouse [phone]  Permission granted to share info w AGENCY: [place] granted to share info w Relationship: husband  Permission granted to share info w Contact Information: [phone]  Emotional Assessment Appearance:: Appears stated age  Attitude/Demeanor/Rapport: Engaged Affect (typically observed): Accepting Orientation: : Oriented to Self, Oriented to Place, Oriented to  Time, Oriented to Situation Alcohol / Substance Use: Not Applicable Psych Involvement: No (comment)  Admission diagnosis:  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 Hypertension   . Type 2 diabetes mellitus ([place])   . Dysphagia   . Severe sepsis with septic shock ([place])   . AKI (acute kidney injury) ([place])   . Acute on chronic respiratory failure with hypoxia ([place])   . 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 Neck abscess 05/19/2020  . Essential tremor 09/25/2019  . Dizziness 02/28/2019  . Mixed hyperlipidemia 02/28/2019  . Lacunar stroke ([place]) 02/28/2019  . Lightheadedness 02/28/2019   PCP:  Lucianne Lei, MD Pharmacy:   [facility], [place] [address] Phone: [phone] Fax: [place], [place] [address] [place] [address] Phone: [phone] Fax: [phone]     Social Determinants of Health ([place]) Interventions    Readmission Risk Interventions Readmission Risk Prevention Plan 05/31/2020  Post Dischage Appt Not Complete  Appt Comments pending disposition  Medication Screening Complete  Transportation Screening Complete  Some recent data might be hidden

## 2020-06-03 NOTE — Progress Notes (Signed)
[facility]  PROGRESS NOTE  Katherine [name] TKW:[id number] DOB: [birth year] DOA: 05/19/2020 PCP: Lucianne Lei, MD   Brief HPI:   79 year old African-American female with past medical history of diabetes mellitus type 2, hypertension, incomplete RBBB who underwent tooth extraction and then developed lower facial pain and swelling along the jaw and anterior neck concerning for Ludwig angina followed by hypotension airway compromise.  She was intubated and underwent incision drainage of the neck abscess and had Penrose drain placed.  She was put on pressor support with mechanical ventilation.  She was initially started on Unasyn however it was changed to IV ceftriaxone based on sensitivity data.  On 05/25/2020 patient self extubated and tolerated nasal cannula.  She was transferred to [facility] service on 05/28/2020. Speech therapy evaluation was done and patient started on dysphagia 3/mechanical soft diet. Currently she is getting IV ceftriaxone day #12 to complete 14 days of treatment on 06/05/2020.   Subjective   Patient seen and examined, denies any complaints.  No pain or swelling.  Tolerating dysphagia 3/soft diet well.   Assessment/Plan:     1. Sepsis secondary to [employer] patient presented with sepsis due to [employer] angina, resolved. Culture from the abscess grew Streptococcus Constellatus, with intermediate sensitivity to penicillin and sensitive to ceftriaxone.  Unasyn was changed to ceftriaxone on 05/22/2020.  Currently on 12th day of ceftriaxone and will need to finish 14 days of treatment on 06/05/2020.  WBC is 14,000 today.  Follow WBC in a.m. 2. Acute hypoxic respiratory failure-resolved, secondary to Ludwig in general, patient was intubated and self extubated herself on 05/25/2020.  Currently no issues. 3. Hypertension, blood pressure stable.  Continue lisinopril. 4. Diabetes mellitus type 2-continue insulin detemir 20 units subcu twice daily, sliding scale insulin NovoLog.   CBG is labile.  We will keep a close watch, and adjust insulin as needed. 5. Hyperlipidemia-continue rosuvastatin.    Scheduled medications:   . chlorhexidine  15 mL Mouth Rinse BID  . Chlorhexidine Gluconate Cloth  6 each Topical Daily  . feeding supplement (GLUCERNA SHAKE)  237 mL Oral TID BM  . fluticasone  1 spray Each Nare Daily  . gabapentin  300 mg Oral BID  . insulin aspart  0-15 Units Subcutaneous TID WC  . insulin detemir  20 Units Subcutaneous BID  . insulin starter kit- pen needles  1 kit Other Once  . lisinopril  5 mg Oral Daily  . magic mouthwash  5 mL Oral QID  . mouth rinse  15 mL Mouth Rinse q12n4p  . multivitamin with minerals  1 tablet Oral Daily  . rosuvastatin  10 mg Per Tube Daily         CBG: Recent Labs  Lab 06/02/20 1651 06/02/20 2055 06/03/20 0750 06/03/20 1142 06/03/20 1220  GLUCAP 213* 143* 80 401* 284*    SpO2: 100 % O2 Flow Rate (L/min): 2 L/min FiO2 (%): 100 %    CBC: Recent Labs  Lab 05/29/20 1459 05/30/20 1030 05/31/20 0315 06/01/20 0352 06/02/20 0431  WBC 12.9* 13.8* 15.9* 14.2* 14.3*  NEUTROABS 8.2* 10.2* 11.2* 9.6* 8.9*  HGB 9.7* 10.3* 10.5* 10.0* 10.1*  HCT 31.5* 32.9* 34.1* 32.8* 33.1*  MCV 92.4 92.4 93.9 94.3 92.5  PLT 562* 627* 641* 587* 584*    Basic Metabolic Panel: Recent Labs  Lab 05/29/20 1459 05/30/20 1030 05/31/20 0315 06/01/20 0352 06/02/20 0431  NA 133* 133* 134* 136 137  K 4.2 4.5 4.6 4.6 4.1  CL 96* 96* 97* 98 101  CO2 _0 GLUCOSE 286* 359* 209* 216* 70  BUN _1 24*  CREATININE 0.63 0.64 0.56 0.61 0.72  CALCIUM 8.9 9.2 9.3 9.2 9.4  MG 1.8 1.8 1.9 1.8 1.8  PHOS 2.9 3.2 3.6 3.3 4.1     Liver Function Tests: Recent Labs  Lab 05/29/20 1459 05/30/20 1030 05/31/20 0315 06/01/20 0352 06/02/20 0431  AST _2 ALT _3 ALKPHOS 89 93 89 79 75  BILITOT 0.2* 0.7 0.5 0.5 0.6  PROT 6.3* 6.5 6.5 6.4* 6.9  ALBUMIN 2.3* 2.4* 2.6* 2.5* 2.7*      Antibiotics: Anti-infectives (From admission, onward)   Start     Dose/Rate Route Frequency Ordered Stop   05/22/20 1415  cefTRIAXone (ROCEPHIN) 2 g in sodium chloride 0.9 % 100 mL IVPB     Discontinue     2 g 200 mL/hr over 30 Minutes Intravenous Every 24 hours 05/22/20 1411 06/05/20 2359   05/19/20 1700  ampicillin-sulbactam (UNASYN) 1.5 g in sodium chloride 0.9 % 100 mL IVPB  Status:  Discontinued        1.5 g 200 mL/hr over 30 Minutes Intravenous Every 6 hours 05/19/20 1647 05/22/20 1411       DVT prophylaxis: SCDs  Code Status: Full code  Family Communication: No family at bedside    Status is: Inpatient  Dispo: The patient is from: Home              Anticipated d/c is to: Home              Anticipated d/c date is: 06/05/2020              Patient currently receiving IV antibiotics to complete 14 days of therapy.  Barrier to discharge-IV antibiotics for 2 more days.          Consultants:  ENT  PCCM  Procedures:  Incision and drainage of neck abscess/Ludwig angina.  Intubation mechanical ventilation   Objective   Vitals:   06/02/20 1331 06/02/20 2058 06/03/20 0439 06/03/20 1415  BP: (!) 116/55 (!) 144/54 (!) 152/62 (!) 142/61  Pulse: 100 96 87 98  Resp: _4 Temp: 98.5 F (36.9 C) 98.1 F (36.7 C) 97.8 F (36.6 C)   TempSrc:      SpO2: 100% 100% 100%   Weight:      Height:        Intake/Output Summary (Last 24 hours) at 06/03/2020 1612 Last data filed at 06/03/2020 1308 Gross per 24 hour  Intake 1180 ml  Output 852 ml  Net 328 ml    07/20 1901 - 07/22 0700 In: 967 [P.O.:967] Out: 1352 [HUTML:4650]  Filed Weights   05/24/20 0409 05/25/20 0340 05/27/20 0500  Weight: 67.6 kg 64.3 kg 60.7 kg    Physical Examination:    General: Appears in no acute distress  Cardiovascular: S1-S2, regular, no murmur auscultated  HEENT-neck dressing in place  Respiratory: Clear to auscultation bilaterally, no wheezing or crackles  auscultated  Abdomen: Abdomen is soft, nontender, no organomegaly  Extremities: No edema in the lower extremities  Neurologic: Alert, oriented x3, no focal deficit noted     [place]   [facility] If 7PM-7AM, please contact night-coverage at [email or web address], Office  [phone]   06/03/2020, 4:12 PM  LOS: 15 days

## 2020-06-03 NOTE — Progress Notes (Addendum)
Calorie Count Note  48 hour calorie count ordered.  Diet: dysphagia 3 with thin lquids Supplements: Glucerna Shake po TID, each supplement provides 220 kcal and 10 grams of protein; Magic cup TID with meals, each supplement provides 290 kcal and 9 grams of protein; MVI with minerals daily  Pt in with case manager at time of visit. Plan for d/c home today.   Noted pt was upgraded to a dysphagia 3 diet by SLP on 06/02/20.   7/20 Breakfast: 745 kcals, 24 grams protein Lunch: 492 kcals, 25 grams protein Dinner: 578 kcals, 24 grams protein  Total intake: 1815 kcal (100% of minimum estimated needs)  73 grams protein (86% of minimum estimated needs)  7/21 Breakfast: 801 kcals, 17 grams protein Lunch: 601 kcals, 28 grams protein Dinner: 350 kcals, 9 grams protein  Total intake: 1752 kcal (100% of minimum estimated needs)  54 grams protein (64% of minimum estimated needs)  AverageTotal intake: 1784 kcal (100% of minimum estimated needs)  64 grams protein (76% of minimum estimated needs)   Nutrition Dx: Inadequate oral intakerelated to acute illness, inability to eatas evidenced by NPO status; progressing- advanced to dysphagia 1 diet on 06/01/20  Goal: Patient will meet greater than or equal to 90% of their needs; progressing   Intervention:  -D/c calorie count -Continue MVI with minerals daily -Continue Glucerna Shake po TID, each supplement provides 220 kcal and 10 grams of protein -Continue Magic cup TID with meals, each supplement provides 290 kcal and 9 grams of protein  Loistine Chance, RD, LDN, [place] Registered Dietitian II Certified Diabetes Care and Education Specialist Please refer to AMION for RD and/or RD on-call/weekend/after hours pager

## 2020-06-04 LAB — GLUCOSE, CAPILLARY
Glucose-Capillary: 74 mg/dL (ref 70–99)
Glucose-Capillary: 148 mg/dL — ABNORMAL HIGH (ref 70–99)
Glucose-Capillary: 137 mg/dL — ABNORMAL HIGH (ref 70–99)
Glucose-Capillary: 141 mg/dL — ABNORMAL HIGH (ref 70–99)

## 2020-06-04 MED ORDER — GLIMEPIRIDE 4 MG PO TABS: 4.0000 mg | ORAL_TABLET | Freq: Every day | ORAL | Status: DC

## 2020-06-04 MED ORDER — GLIMEPIRIDE 4 MG PO TABS
2.0000 mg | ORAL_TABLET | Freq: Every day | ORAL | Status: DC
  Administered 2020-06-04 – 2020-06-05 (×2): 2 mg via ORAL
  Filled 2020-06-04 (×2): qty 1

## 2020-06-04 MED ORDER — METFORMIN HCL 500 MG PO TABS
500.0000 mg | ORAL_TABLET | Freq: Two times a day (BID) | ORAL | Status: DC
  Administered 2020-06-04 – 2020-06-05 (×3): 500 mg via ORAL
  Filled 2020-06-04 (×3): qty 1

## 2020-06-04 NOTE — Progress Notes (Addendum)
[facility]  PROGRESS NOTE  Katherine [name] GHW:[id number] DOB: 1941/03/03 DOA: 05/19/2020 PCP: Lucianne Lei, MD   Brief HPI:   79 year old African-American female with past medical history of diabetes mellitus type 2, hypertension, incomplete RBBB who underwent tooth extraction and then developed lower facial pain and swelling along the jaw and anterior neck concerning for Ludwig angina followed by hypotension airway compromise.  She was intubated and underwent incision drainage of the neck abscess and had Penrose drain placed.  She was put on pressor support with mechanical ventilation.  She was initially started on Unasyn however it was changed to IV ceftriaxone based on sensitivity data.  On 05/25/2020 patient self extubated and tolerated nasal cannula.  She was transferred to [facility] service on 05/28/2020. Speech therapy evaluation was done and patient started on dysphagia 3/mechanical soft diet. Currently she is getting IV ceftriaxone day #12 to complete 14 days of treatment on 06/05/2020.   Subjective   Patient seen and examined, denies any complaints.  Tolerating dysphagia 3 diet.  Patient does not want insulin at home.  Want p.o. medications.   Assessment/Plan:     1. Sepsis present on admission , secondary to [employer] patient presented with sepsis due to [place] angina, resolved. Culture from the abscess grew Streptococcus Constellatus, with intermediate sensitivity to penicillin and sensitive to ceftriaxone.  Unasyn was changed to ceftriaxone on 05/22/2020.  Currently on 12th day of ceftriaxone and will need to finish 14 days of treatment on 06/05/2020.  WBC is 14,000 today.  Follow WBC in a.m. 2. Acute hypoxic respiratory failure-resolved, secondary to Ludwig in general, patient was intubated and self extubated herself on 05/25/2020.  Currently no issues. 3. Hypertension, blood pressure stable.  Continue lisinopril. 4. Diabetes mellitus type 2-we will start Amaryl 2 mg every  morning, Metformin 5 mg p.o. twice daily.  Continue sliding scale insulin NovoLog.  Will discontinue Levemir.  Patient does not want Levemir at home.   5. Hyperlipidemia-continue rosuvastatin.    Scheduled medications:   . chlorhexidine  15 mL Mouth Rinse BID  . Chlorhexidine Gluconate Cloth  6 each Topical Daily  . feeding supplement (GLUCERNA SHAKE)  237 mL Oral TID BM  . fluticasone  1 spray Each Nare Daily  . gabapentin  300 mg Oral BID  . glimepiride  2 mg Oral Q breakfast  . insulin aspart  0-15 Units Subcutaneous TID WC  . insulin starter kit- pen needles  1 kit Other Once  . lisinopril  5 mg Oral Daily  . magic mouthwash  5 mL Oral QID  . mouth rinse  15 mL Mouth Rinse q12n4p  . metFORMIN  500 mg Oral BID WC  . multivitamin with minerals  1 tablet Oral Daily  . rosuvastatin  10 mg Per Tube Daily         CBG: Recent Labs  Lab 06/03/20 1220 06/03/20 1722 06/03/20 2054 06/04/20 0735 06/04/20 1158  GLUCAP 284* 206* 181* 74 148*    SpO2: 100 % O2 Flow Rate (L/min): 2 L/min FiO2 (%): 100 %    CBC: Recent Labs  Lab 05/29/20 1459 05/30/20 1030 05/31/20 0315 06/01/20 0352 06/02/20 0431  WBC 12.9* 13.8* 15.9* 14.2* 14.3*  NEUTROABS 8.2* 10.2* 11.2* 9.6* 8.9*  HGB 9.7* 10.3* 10.5* 10.0* 10.1*  HCT 31.5* 32.9* 34.1* 32.8* 33.1*  MCV 92.4 92.4 93.9 94.3 92.5  PLT 562* 627* 641* 587* 584*    Basic Metabolic Panel: Recent Labs  Lab 05/29/20 1459 05/30/20 1030 05/31/20 0315  06/01/20 0352 06/02/20 0431  NA 133* 133* 134* 136 137  K 4.2 4.5 4.6 4.6 4.1  CL 96* 96* 97* 98 101  CO2 '27 29 30 28 27  '$ GLUCOSE 286* 359* 209* 216* 70  BUN '14 15 16 19 '$ 24*  CREATININE 0.63 0.64 0.56 0.61 0.72  CALCIUM 8.9 9.2 9.3 9.2 9.4  MG 1.8 1.8 1.9 1.8 1.8  PHOS 2.9 3.2 3.6 3.3 4.1     Liver Function Tests: Recent Labs  Lab 05/29/20 1459 05/30/20 1030 05/31/20 0315 06/01/20 0352 06/02/20 0431  AST '30 26 20 16 20  '$ ALT '31 30 28 22 27  '$ ALKPHOS 89 93 89 79 75   BILITOT 0.2* 0.7 0.5 0.5 0.6  PROT 6.3* 6.5 6.5 6.4* 6.9  ALBUMIN 2.3* 2.4* 2.6* 2.5* 2.7*     Antibiotics: Anti-infectives (From admission, onward)   Start     Dose/Rate Route Frequency Ordered Stop   05/22/20 1415  cefTRIAXone (ROCEPHIN) 2 g in sodium chloride 0.9 % 100 mL IVPB     Discontinue     2 g 200 mL/hr over 30 Minutes Intravenous Every 24 hours 05/22/20 1411 06/05/20 2359   05/19/20 1700  ampicillin-sulbactam (UNASYN) 1.5 g in sodium chloride 0.9 % 100 mL IVPB  Status:  Discontinued        1.5 g 200 mL/hr over 30 Minutes Intravenous Every 6 hours 05/19/20 1647 05/22/20 1411       DVT prophylaxis: SCDs  Code Status: Full code  Family Communication: No family at bedside    Status is: Inpatient  Dispo: The patient is from: Home              Anticipated d/c is to: Home              Anticipated d/c date is: 06/05/2020              Patient currently receiving IV antibiotics to complete 14 days of therapy.  Barrier to discharge-IV antibiotics for 2 more days.          Consultants:  ENT  PCCM  Procedures:  Incision and drainage of neck abscess/Ludwig angina.  Intubation mechanical ventilation   Objective   Vitals:   06/03/20 0439 06/03/20 1415 06/03/20 2058 06/04/20 0454  BP: (!) 152/62 (!) 142/61 (!) 121/47 (!) 142/65  Pulse: 87 98 90 89  Resp: '17 16 14 14  '$ Temp: 97.8 F (36.6 C)  98.7 F (37.1 C) 98.2 F (36.8 C)  TempSrc:   Oral Oral  SpO2: 100%  100% 100%  Weight:      Height:        Intake/Output Summary (Last 24 hours) at 06/04/2020 1515 Last data filed at 06/04/2020 7544 Gross per 24 hour  Intake 580 ml  Output 750 ml  Net -170 ml    07/21 1901 - 07/23 0700 In: 960 [P.O.:960] Out: 1500 [Urine:1500]  Filed Weights   05/24/20 0409 05/25/20 0340 05/27/20 0500  Weight: 67.6 kg 64.3 kg 60.7 kg    Physical Examination:   General-appears in no acute distress  Heart-S1-S2, regular, no murmur auscultated  HEENT-neck  dressing in place  Lungs-clear to auscultation bilaterally, no wheezing or crackles auscultated  Abdomen-soft, nontender, no organomegaly  Extremities-no edema in the lower extremities  Neuro-alert, oriented x3, no focal deficit noted     [place]   [facility] If 7PM-7AM, please contact night-coverage at [email or web address], Office  [phone]   06/04/2020, 3:15 PM  LOS: 16  days

## 2020-06-04 NOTE — Progress Notes (Signed)
OT Cancellation Note  Patient Details Name: Katherine [name] MRN: [id number] DOB: 05/11/1941   Cancelled Treatment:    Reason Eval/Treat Not Completed: Fatigue/lethargy limiting ability to participate;Other (comment) Attempted to see pt 2x. On first attempt pt requesting to finish breakfast. On second attempt pt reporting feeling tired from already being up all morning. Will check as schedule permits for OT session.   Lanier Clam., COTA/L Acute [facility] Services [phone] [place] 06/04/2020, 10:16 AM

## 2020-06-05 LAB — GLUCOSE, CAPILLARY
Glucose-Capillary: 139 mg/dL — ABNORMAL HIGH (ref 70–99)
Glucose-Capillary: 164 mg/dL — ABNORMAL HIGH (ref 70–99)

## 2020-06-05 MED ORDER — METFORMIN HCL 500 MG PO TABS: 500.0000 mg | ORAL_TABLET | Freq: Two times a day (BID) | ORAL | 2 refills | Status: AC

## 2020-06-05 MED ORDER — GABAPENTIN 300 MG PO CAPS: 300.0000 mg | ORAL_CAPSULE | Freq: Two times a day (BID) | ORAL | 2 refills | Status: AC

## 2020-06-05 MED ORDER — OXYCODONE HCL 5 MG PO TABS: 5.0000 mg | ORAL_TABLET | Freq: Four times a day (QID) | ORAL | 0 refills | Status: DC | PRN

## 2020-06-05 MED ORDER — GLIMEPIRIDE 2 MG PO TABS: 2.0000 mg | ORAL_TABLET | Freq: Every day | ORAL | 2 refills | Status: AC

## 2020-06-05 NOTE — Plan of Care (Signed)
  Problem: Education: Goal: Knowledge of General Education information will improve Description: Including pain rating scale, medication(s)/side effects and non-pharmacologic comfort measures 06/05/2020 1541 by Mikki Harbor, RN Outcome: Adequate for Discharge 06/05/2020 1126 by Mikki Harbor, RN Outcome: Progressing   Problem: Health Behavior/Discharge Planning: Goal: Ability to manage health-related needs will improve 06/05/2020 1541 by Mikki Harbor, RN Outcome: Adequate for Discharge 06/05/2020 1126 by Mikki Harbor, RN Outcome: Progressing   Problem: Clinical Measurements: Goal: Ability to maintain clinical measurements within normal limits will improve 06/05/2020 1541 by Mikki Harbor, RN Outcome: Adequate for Discharge 06/05/2020 1126 by Mikki Harbor, RN Outcome: Progressing Goal: Will remain free from infection 06/05/2020 1541 by Mikki Harbor, RN Outcome: Adequate for Discharge 06/05/2020 1126 by Mikki Harbor, RN Outcome: Progressing Goal: Diagnostic test results will improve 06/05/2020 1541 by Mikki Harbor, RN Outcome: Adequate for Discharge 06/05/2020 1126 by Mikki Harbor, RN Outcome: Progressing Goal: Respiratory complications will improve 06/05/2020 1541 by Mikki Harbor, RN Outcome: Adequate for Discharge 06/05/2020 1126 by Mikki Harbor, RN Outcome: Progressing Goal: Cardiovascular complication will be avoided 06/05/2020 1541 by Mikki Harbor, RN Outcome: Adequate for Discharge 06/05/2020 1126 by Mikki Harbor, RN Outcome: Progressing   Problem: Activity: Goal: Risk for activity intolerance will decrease 06/05/2020 1541 by Mikki Harbor, RN Outcome: Adequate for Discharge 06/05/2020 1126 by Mikki Harbor, RN Outcome: Progressing   Problem: Nutrition: Goal: Adequate nutrition will be maintained 06/05/2020 1541 by Mikki Harbor, RN Outcome: Adequate for Discharge 06/05/2020 1126 by  Mikki Harbor, RN Outcome: Progressing   Problem: Coping: Goal: Level of anxiety will decrease 06/05/2020 1541 by Mikki Harbor, RN Outcome: Adequate for Discharge 06/05/2020 1126 by Mikki Harbor, RN Outcome: Progressing   Problem: Elimination: Goal: Will not experience complications related to bowel motility 06/05/2020 1541 by Mikki Harbor, RN Outcome: Adequate for Discharge 06/05/2020 1126 by Mikki Harbor, RN Outcome: Progressing Goal: Will not experience complications related to urinary retention 06/05/2020 1541 by Mikki Harbor, RN Outcome: Adequate for Discharge 06/05/2020 1126 by Mikki Harbor, RN Outcome: Progressing   Problem: Safety: Goal: Ability to remain free from injury will improve 06/05/2020 1541 by Mikki Harbor, RN Outcome: Adequate for Discharge 06/05/2020 1126 by Mikki Harbor, RN Outcome: Progressing   Problem: Pain Managment: Goal: General experience of comfort will improve 06/05/2020 1541 by Mikki Harbor, RN Outcome: Adequate for Discharge 06/05/2020 1126 by Mikki Harbor, RN Outcome: Progressing   Problem: Skin Integrity: Goal: Risk for impaired skin integrity will decrease 06/05/2020 1541 by Mikki Harbor, RN Outcome: Adequate for Discharge 06/05/2020 1126 by Mikki Harbor, RN Outcome: Progressing

## 2020-06-05 NOTE — Progress Notes (Signed)
Pt discharge covered with pt and spouse. Educated on dressing change. All belongings sent with pt. Assisted to vehicle by staff

## 2020-06-05 NOTE — Discharge Summary (Addendum)
Physician Discharge Summary  Atiyana [name] SWF:[id number] DOB: 1941/11/07 DOA: 05/19/2020  PCP: Lucianne Lei, MD  Admit date: 05/19/2020 Discharge date: 06/05/2020  Time spent: 50* minutes  Recommendations for Outpatient Follow-up:  1. Follow-up ENT in 1 week 2. Follow PCP in 2 weeks 3. Continue soft diet, check with your dentist regarding using her dentures and advancing  diet   Discharge Diagnoses:  Principal Problem:   Severe sepsis with septic shock ([place]) Active Problems:   Ludwig's angina   Neck abscess   Acute on chronic respiratory failure with hypoxia ([facility])   Hypertension   Type 2 diabetes mellitus ([place])   Dysphagia   AKI (acute kidney injury) ([place])   Discharge Condition: Stable  Diet recommendation: Soft diet, carb modified diet  Filed Weights   05/24/20 0409 05/25/20 0340 05/27/20 0500  Weight: 67.6 kg 64.3 kg 60.7 kg    History of present illness:  79 year old African-American female with past medical history of diabetes mellitus type 2, hypertension, incomplete RBBB who underwent tooth extraction and then developed lower facial pain and swelling along the jaw and anterior neck concerning for Ludwig angina followed by hypotension airway compromise.  She was intubated and underwent incision drainage of the neck abscess and had Penrose drain placed.  She was put on pressor support with mechanical ventilation.  She was initially started on Unasyn however it was changed to IV ceftriaxone based on sensitivity data.  On 05/25/2020 patient self extubated and tolerated nasal cannula.  She was transferred to [facility] service on 05/28/2020. Speech therapy evaluation was done and patient started on dysphagia 3/mechanical soft diet. Currently she is getting IV ceftriaxone day #12 to complete 14 days of treatment on 06/05/2020.   Hospital Course:   1. Sepsis present on admission , secondary to [place] angina-  resolved patient presented with sepsis due to [place] angina, resolved.  Culture from the abscess grew Streptococcus Constellatus, with intermediate sensitivity to penicillin and sensitive to ceftriaxone.  Unasyn was changed to ceftriaxone on 05/22/2020.    Patient completed 14 days of IV ceftriaxone in the hospital.  Patient to follow-up with ENT in 1 week.  Continue dressing changes twice a day. 2. Acute hypoxic respiratory failure-resolved, secondary to Ludwig in general, patient was intubated and self extubated herself on 05/25/2020.   3. Hypertension, blood pressure stable.  Continue lisinopril. 4. Diabetes mellitus type 2-hemoglobin A1c is 10.3.  Patient started on  Amaryl 2 mg every morning, Metformin 500 mg p.o. twice daily.    Patient does not want insulin at home.  Blood sugars well controlled.  She is to follow-up with her PCP. 5. Hyperlipidemia-continue rosuvastatin.  Procedures:  Incision and drainage for neck abscess  Consultations:  ENT  Discharge Exam: Vitals:   06/05/20 0427 06/05/20 0428  BP: (!) 132/43 (!) 135/49  Pulse: 86 87  Resp: 18   Temp: 99.5 F (37.5 C)   SpO2: 99%     General: Appears in no acute distress Cardiovascular: S1-S2, regular Respiratory: Clear to auscultation bilaterally  Discharge Instructions   Discharge Instructions    Change dressing (specify)   Complete by: As directed    Dressing change: 2  times per day using , wet to dry   Diet - low sodium heart healthy   Complete by: As directed    Increase activity slowly   Complete by: As directed      Allergies as of 06/05/2020   No Known Allergies     Medication List  STOP taking these medications   ibuprofen 200 MG tablet Commonly known as: ADVIL     TAKE these medications   acetaminophen 500 MG tablet Commonly known as: TYLENOL Take 500-1,000 mg by mouth every 6 (six) hours as needed (for pain).   citalopram 10 MG tablet Commonly known as: CELEXA   gabapentin 300 MG capsule Commonly known as: NEURONTIN Take 1 capsule (300 mg total) by  mouth 2 (two) times daily.   glimepiride 2 MG tablet Commonly known as: AMARYL Take 1 tablet (2 mg total) by mouth daily with breakfast. Start taking on: June 06, 2020   lisinopril 5 MG tablet Commonly known as: ZESTRIL Take 5 mg by mouth daily.   metFORMIN 500 MG tablet Commonly known as: GLUCOPHAGE Take 1 tablet (500 mg total) by mouth 2 (two) times daily with a meal. What changed: when to take this   mirtazapine 30 MG tablet Commonly known as: REMERON TAKE 1 TABLET(30 MG) BY MOUTH AT BEDTIME What changed: See the new instructions.   multivitamin tablet Take 1 tablet by mouth 3 (three) times a week.   oxyCODONE 5 MG immediate release tablet Commonly known as: Oxy IR/ROXICODONE Take 1 tablet (5 mg total) by mouth every 6 (six) hours as needed for moderate pain or severe pain.   rosuvastatin 10 MG tablet Commonly known as: CRESTOR TAKE 1 TABLET(10 MG) BY MOUTH DAILY What changed: See the new instructions.   timolol 0.5 % ophthalmic solution Commonly known as: TIMOPTIC Place 1 drop into both eyes 2 (two) times daily.            Durable Medical Equipment  (From admission, onward)         Start     Ordered   06/05/20 1130  For home use only DME Walker rolling  Once       Question Answer Comment  Walker: With 5 Inch Wheels   Patient needs a walker to treat with the following condition Weakness      06/05/20 1129   06/05/20 1130  For home use only DME 3 n 1  Once        06/05/20 1129           Discharge Care Instructions  (From admission, onward)         Start     Ordered   06/05/20 0000  Change dressing (specify)       Comments: Dressing change: 2  times per day using , wet to dry   06/05/20 1149         No Known Allergies  Follow-up Information    [facility].   Specialty: Home Health Services Contact information: [place] [phone]        Jerrell Belfast, MD. Schedule  an appointment as soon as possible for a visit in 1 week(s).   Specialty: Otolaryngology Contact information: [address] [place] [address] [phone]                The results of significant diagnostics from this hospitalization (including imaging, microbiology, ancillary and laboratory) are listed below for reference.    Significant Diagnostic Studies: CT SOFT TISSUE NECK W CONTRAST  Result Date: 05/20/2020 CLINICAL DATA:  Respiratory failure EXAM: CT NECK WITH CONTRAST TECHNIQUE: Multidetector CT imaging of the neck was performed using the standard protocol following the bolus administration of intravenous contrast. CONTRAST:  52mL OMNIPAQUE IOHEXOL 300 MG/ML  SOLN COMPARISON:  None. FINDINGS: Pharynx and larynx: There is a loculated gas and fluid collection in the sublingual space, measuring approximately 4.5 x 4.7 cm. The collection extends into the submandibular space. There is surrounding subcutaneous induration and thickening of the platysma. There is a subperiosteal abscess along the internal surface of the right mandibular body, near an empty posterior molar socket. Salivary glands: No inflammation, mass, or stone. Thyroid: Normal. Lymph nodes: None enlarged or abnormal density. Vascular: Negative. Limited intracranial: Negative. Visualized orbits: None Mastoids and visualized paranasal sinuses: Clear. Skeleton: No acute or aggressive process. Upper chest: Endotracheal tube tip is below the thoracic inlet and above the carina. Other: None IMPRESSION: 1. Trans-spatial gas and fluid containing abscess of the floor of mouth, measuring 4.5 x 4.7 cm. 2. Small subperiosteal abscess along the internal surface of the right mandibular body, near an empty posterior molar socket. 3. Endotracheal tube tip below the thoracic inlet and above the carina. Electronically Signed   By: Ulyses Jarred M.D.   On: 05/20/2020 00:08   DG CHEST PORT 1 VIEW  Result Date:  05/29/2020 CLINICAL DATA:  Evaluate for pneumonia.  Shortness of breath. EXAM: PORTABLE CHEST 1 VIEW COMPARISON:  05/23/2020 FINDINGS: Endotracheal tube has been removed in the interim. Feeding tube extends into the abdomen but the tip is beyond the image. Right arm PICC line tip in the SVC. Both lungs are clear. Heart and mediastinum are within normal limits. Negative for a pneumothorax. Bone structures are unremarkable. IMPRESSION: No acute cardiopulmonary disease. Support apparatuses as described. Electronically Signed   By: Markus Daft M.D.   On: 05/29/2020 12:39   DG CHEST PORT 1 VIEW  Result Date: 05/23/2020 CLINICAL DATA:  Respiratory failure EXAM: PORTABLE CHEST 1 VIEW COMPARISON:  Chest x-ray dated 05/20/2020. FINDINGS: Heart size and mediastinal contours are within normal limits. Endotracheal tube is well positioned above the level of the carina. Enteric tube passes below the diaphragm. Lungs are clear. No pleural effusion or pneumothorax is seen. IMPRESSION: 1. Lungs are clear.  No evidence of pneumonia or pulmonary edema. 2. Support apparatus appears appropriately positioned. Electronically Signed   By: Franki Cabot M.D.   On: 05/23/2020 07:50   DG Chest Port 1 View  Result Date: 05/20/2020 CLINICAL DATA:  Ludwig's angina. EXAM: PORTABLE CHEST 1 VIEW COMPARISON:  CT neck 05/19/2020.  Chest x-ray 04/28/2015. FINDINGS: Tracheostomy tube tip noted 4 cm above the carina. Mediastinum hilar structures normal. Heart size normal. Mild right mid lung subsegmental atelectasis. No pleural effusion or pneumothorax. No acute bony abnormality. IMPRESSION: 1. Tracheostomy tube noted with its tip 4 cm above the carina. Mediastinum appears normal. 2.  Mild right mid lung subsegmental atelectasis. Electronically Signed   By: Marcello Moores  Register   On: 05/20/2020 06:43   [place] LT UPPER EXTREM LTD SOFT TISSUE NON VASCULAR  Result Date: 05/30/2020 CLINICAL DATA:  Left arm swelling. EXAM: ULTRASOUND LEFT UPPER EXTREMITY  LIMITED TECHNIQUE: Ultrasound examination of the upper extremity soft tissues was performed in the area of clinical concern. COMPARISON:  None. FINDINGS: Scanning was directed toward the region of concern in the left antecubital fossa. No fluid collection or mass. No subcutaneous edema. IMPRESSION: Negative exam. Electronically Signed   By: Inge Rise M.D.   On: 05/30/2020 16:32   [place] EKG SITE RITE  Result Date: 05/23/2020 If Site Rite image not attached, placement could not be confirmed due to current cardiac rhythm.   Microbiology: No results found for this or any previous  visit (from the past 240 hour(s)).   Labs: Basic Metabolic Panel: Recent Labs  Lab 05/29/20 1459 05/30/20 1030 05/31/20 0315 06/01/20 0352 06/02/20 0431  NA 133* 133* 134* 136 137  K 4.2 4.5 4.6 4.6 4.1  CL 96* 96* 97* 98 101  CO2 27 29 30 28 27   GLUCOSE 286* 359* 209* 216* 70  BUN 14 15 16 19  24*  CREATININE 0.63 0.64 0.56 0.61 0.72  CALCIUM 8.9 9.2 9.3 9.2 9.4  MG 1.8 1.8 1.9 1.8 1.8  PHOS 2.9 3.2 3.6 3.3 4.1   Liver Function Tests: Recent Labs  Lab 05/29/20 1459 05/30/20 1030 05/31/20 0315 06/01/20 0352 06/02/20 0431  AST 30 26 20 16 20   ALT 31 30 28 22 27   ALKPHOS 89 93 89 79 75  BILITOT 0.2* 0.7 0.5 0.5 0.6  PROT 6.3* 6.5 6.5 6.4* 6.9  ALBUMIN 2.3* 2.4* 2.6* 2.5* 2.7*   No results for input(s): LIPASE, AMYLASE in the last 168 hours. No results for input(s): AMMONIA in the last 168 hours. CBC: Recent Labs  Lab 05/29/20 1459 05/30/20 1030 05/31/20 0315 06/01/20 0352 06/02/20 0431  WBC 12.9* 13.8* 15.9* 14.2* 14.3*  NEUTROABS 8.2* 10.2* 11.2* 9.6* 8.9*  HGB 9.7* 10.3* 10.5* 10.0* 10.1*  HCT 31.5* 32.9* 34.1* 32.8* 33.1*  MCV 92.4 92.4 93.9 94.3 92.5  PLT 562* 627* 641* 587* 584*    CBG: Recent Labs  Lab 06/04/20 1158 06/04/20 1704 06/04/20 2034 06/05/20 0746 06/05/20 1152  GLUCAP 148* 137* 141* 139* 164*       Signed:  Oswald Hillock MD.  [facility] 06/05/2020, 11:54 AM

## 2020-06-05 NOTE — Discharge Instructions (Signed)
Soft-Food Eating Plan A soft-food eating plan includes foods that are safe and easy to chew and swallow. Your health care provider or dietitian can help you find foods and flavors that fit into this plan. Follow this plan until your health care provider or dietitian says it is safe to start eating other foods and food textures. What are tips for following this plan? General guidelines   Take small bites of food, or cut food into pieces about  inch or smaller. Bite-sized pieces of food are easier to chew and swallow.  Eat moist foods. Avoid overly dry foods.  Avoid foods that: ? Are difficult to swallow, such as dry, chunky, crispy, or sticky foods. ? Are difficult to chew, such as hard, tough, or stringy foods. ? Contain nuts, seeds, or fruits.  Follow instructions from your dietitian about the types of liquids that are safe for you to swallow. You may be allowed to have: ? Thick liquids only. This includes only liquids that are thicker than honey. ? Thin and thick liquids. This includes all beverages and foods that become liquid at room temperature.  To make thick liquids: ? Purchase a commercial liquid thickening powder. These are available at grocery stores and pharmacies. ? Mix the thickener into liquids according to instructions on the label. ? Purchase ready-made thickened liquids. ? Thicken soup by pureeing, straining to remove chunks, and adding flour, potato flakes, or corn starch. ? Add commercial thickener to foods that become liquid at room temperature, such as milk shakes, yogurt, ice cream, gelatin, and sherbet.  Ask your health care provider whether you need to take a fiber supplement. Cooking  Cook meats so they stay tender and moist. Use methods like braising, stewing, or baking in liquid.  Cook vegetables and fruit until they are soft enough to be mashed with a fork.  Peel soft, fresh fruits such as peaches, nectarines, and melons.  When making soup, make sure  chunks of meat and vegetables are smaller than  inch.  Reheat leftover foods slowly so that a tough crust does not form. What foods are allowed? The items listed below may not be a complete list. Talk with your dietitian about what dietary choices are best for you. Grains Breads, muffins, pancakes, or waffles moistened with syrup, jelly, or butter. Dry cereals well-moistened with milk. Moist, cooked cereals. Well-cooked pasta and rice. Vegetables All soft-cooked vegetables. Shredded lettuce. Fruits All canned and cooked fruits. Soft, peeled fresh fruits. Strawberries. Dairy Milk. Cream. Yogurt. Cottage cheese. Soft cheese without the rind. Meats and other protein foods Tender, moist ground meat, poultry, or fish. Meat cooked in gravy or sauces. Eggs. Sweets and desserts Ice cream. Milk shakes. Sherbet. Pudding. Fats and oils Butter. Margarine. Olive, canola, sunflower, and grapeseed oil. Smooth salad dressing. Smooth cream cheese. Mayonnaise. Gravy. What foods are not allowed? The items listed bemay not be a complete list. Talk with your dietitian about what dietary choices are best for you. Grains Coarse or dry cereals, such as bran, granola, and shredded wheat. Tough or chewy crusty breads, such as French bread or baguettes. Breads with nuts, seeds, or fruit. Vegetables All raw vegetables. Cooked corn. Cooked vegetables that are tough or stringy. Tough, crisp, fried potatoes and potato skins. Fruits Fresh fruits with skins or seeds, or both, such as apples, pears, and grapes. Stringy, high-pulp fruits, such as papaya, pineapple, coconut, and mango. Fruit leather and all dried fruit. Dairy Yogurt with nuts or coconut. Meats and other protein foods Hard,   dry sausages. Dry meat, poultry, or fish. Meats with gristle. Fish with bones. Fried meat or fish. Lunch meat and hotdogs. Nuts and seeds. Chunky peanut butter or other nut butters. Sweets and desserts Cakes or cookies that are very  dry or chewy. Desserts with dried fruit, nuts, or coconut. Fried pastries. Very rich pastries. Fats and oils Cream cheese with fruit or nuts. Salad dressings with seeds or chunks. Summary  A soft-food eating plan includes foods that are safe and easy to swallow. Generally, the foods should be soft enough to be mashed with a fork.  Avoid foods that are dry, hard to chew, crunchy, sticky, stringy, or crispy.  Ask your health care provider whether you need to thicken your liquids and if you need to take a fiber supplement. This information is not intended to replace advice given to you by your health care provider. Make sure you discuss any questions you have with your health care provider. Document Revised: 02/20/2019 Document Reviewed: 01/02/2017 Elsevier Patient Education  2020 [employer]  

## 2020-06-05 NOTE — Progress Notes (Signed)
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 PMHx: HTN, HLD, T2DM, essential tremor, exotropia of right eye, glaucoma, incomplete right bundle branch block, left anterior fascicular block, stress urinary incontinence,   OT comments  Patient continues to make steady progress towards goals in skilled OT session. Patient's session encompassed education in preparation for discharge home with regard to functional mobility, safety, energy conservation, and completion of ADLs. Pt declining OOB in session, preferring to sit up in bed to finish lunch, often stating that she keeps getting "bombarded" by therapy and she doesn't know how therapy can help her get home. Reiterated education with regard for safety in getting into the house, as well as attempted to provide strategies for getting dressed in order to promote increased independence. Despite husband coming soon to bring her home, pt declining to work on dressing despite stating "all I have is a gown on, how can you help me with that?" even after acknowledging that she has clothes at the hospital she could work with. Therapist receptive to pt's concerns throughout session and empathetic to pt's situation feeling like she was constantly bombarded. Discharge remains appropriate at this time; will continue to follow acutely.    Follow Up Recommendations  Home health OT;Supervision/Assistance - 24 hour    Equipment Recommendations  3 in 1 bedside commode    Recommendations for Other Services      Precautions / Restrictions Precautions Precautions: Fall Restrictions Weight Bearing Restrictions: No        Mobility Bed Mobility Overal bed mobility: Needs Assistance             General bed mobility comments: increased time and effort to sit up in bed; patient declined OOB  Transfers                      Balance                                           ADL either performed or assessed with clinical judgement   ADL Overall ADL's : Needs assistance/impaired                                       General ADL Comments: Session focus on education with regard to discharge home for safety     Vision       Perception     Praxis      Cognition Arousal/Alertness: Awake/alert Behavior During Therapy: WFL for tasks assessed/performed;Agitated Overall Cognitive Status: Within Functional Limits for tasks assessed                                 General Comments: Perseverated on being "bombarded" with therapy        Exercises     Shoulder Instructions       General Comments      Pertinent Vitals/ Pain       Pain Assessment: No/denies pain  Home Living  Prior Functioning/Environment              Frequency  Min 2X/week        Progress Toward Goals  OT Goals(current goals can now be found in the care plan section)  Progress towards OT goals: Progressing toward goals  Acute Rehab OT Goals Patient Stated Goal: to get back to independence and not rely on anyone else OT Goal Formulation: With patient Time For Goal Achievement: 06/14/20 Potential to Achieve Goals: Good  Plan Discharge plan remains appropriate    Co-evaluation                 AM-PAC OT "6 Clicks" Daily Activity     Outcome Measure   Help from another person eating meals?: None Help from another person taking care of personal grooming?: A Little Help from another person toileting, which includes using toliet, bedpan, or urinal?: A Little Help from another  person bathing (including washing, rinsing, drying)?: A Little Help from another person to put on and taking off regular upper body clothing?: A Little Help from another person to put on and taking off regular lower body clothing?: A Lot 6 Click Score: 18    End of Session    OT Visit Diagnosis: Unsteadiness on feet (R26.81);Muscle weakness (generalized) (M62.81)   Activity Tolerance Patient tolerated treatment well;Other (comment) (Frustrated in session and declining additional help in session (see note))   Patient Left in bed;with call bell/phone within reach   Nurse Communication Mobility status        Time: 2952-8413 OT Time Calculation (min): 9 min  Charges: OT General Charges $OT Visit: 1 Visit OT Treatments $Self Care/Home Management : 8-22 mins  Katherine [name], COTA/L Acute Rehabilitation Services [phone]   Katherine [name] 06/05/2020, 2:48 PM

## 2020-06-05 NOTE — Plan of Care (Signed)

## 2020-07-06 DIAGNOSIS — R0789 Other chest pain: Secondary | ICD-10-CM | POA: Insufficient documentation

## 2020-07-06 NOTE — Progress Notes (Signed)
Subjective:   Katherine [name], female    DOB: [birth year], 79 y.o.   MRN: [id number]  Chief complaint:  Dizziness   HPI  79 y/o [place] American female with type 2 DM, with persistent dizziness  Patient was hospitalized in July 2021 with Ludwig's angina, airway obstruction requiring intubation, severe sepsis. She is to undergo incision and drainage of neck abscess with drain placement.  Patient recovered well from this hospitalization.  She still has dressing at the site of incision and drainage.  She is here today with complaints of left-sided chest pain.  She is not able to qualify or quantify the pain well to me.  She says "pain is a pain".  She does not have any specific exertional component, pain occurs both at rest as well as with regular activity at home.  Pain lasts for few minutes at a time, and is usually self resolving.  She does report associated shortness of breath.  Initial consultation HPI 02/2019: Patient has been seen by me, as well as Neurologists Dr. Leta Baptist ([place]) and Tat [facility] neurology). Her cardiovascular workup is unremarkable to explain her symptoms. Dr. Carles Collet felt that she did not meet criteria for Parkinson's disease. While her symptoms appear postural in nature, her orthostatic vitals have been negative. It appears that patient's PCP Dr. Criss Rosales had ordered a brain MRI. MRI shows progressive chronic small vessel ischemic disease from 2010, including multiple lacunar infarcts. Patient does not have any other neurological symptoms, including weakness, numbness, speech abnormalities, facial droop, visual disturbances etc.     Current Outpatient Medications on File Prior to Visit  Medication Sig Dispense Refill  . acetaminophen (TYLENOL) 500 MG tablet Take 500-1,000 mg by mouth every 6 (six) hours as needed (for pain).    . citalopram (CELEXA) 10 MG tablet  (Patient not taking: Reported on 05/19/2020)    . gabapentin (NEURONTIN) 300 MG capsule Take 1 capsule (300 mg  total) by mouth 2 (two) times daily. 60 capsule 2  . glimepiride (AMARYL) 2 MG tablet Take 1 tablet (2 mg total) by mouth daily with breakfast. 30 tablet 2  . lisinopril (ZESTRIL) 5 MG tablet Take 5 mg by mouth daily. (Patient not taking: Reported on 05/19/2020)    . metFORMIN (GLUCOPHAGE) 500 MG tablet Take 1 tablet (500 mg total) by mouth 2 (two) times daily with a meal. 60 tablet 2  . mirtazapine (REMERON) 30 MG tablet TAKE 1 TABLET(30 MG) BY MOUTH AT BEDTIME (Patient taking differently: Take 30 mg by mouth at bedtime as needed (for sleep). ) 30 tablet 3  . Multiple Vitamin (MULTIVITAMIN) tablet Take 1 tablet by mouth 3 (three) times a week.     Marland Kitchen oxyCODONE (OXY IR/ROXICODONE) 5 MG immediate release tablet Take 1 tablet (5 mg total) by mouth every 6 (six) hours as needed for moderate pain or severe pain. 20 tablet 0  . rosuvastatin (CRESTOR) 10 MG tablet TAKE 1 TABLET(10 MG) BY MOUTH DAILY (Patient taking differently: Take 10 mg by mouth every evening. ) 90 tablet 3  . timolol (TIMOPTIC) 0.5 % ophthalmic solution Place 1 drop into both eyes 2 (two) times daily.      No current facility-administered medications on file prior to visit.    Cardiovascular studies:  EKG 07/07/2020: Sinus rhythm 81 bpm Incomplete RBBB, LAFBLeft atrial enlargement  Carotid US 09/2019: Right Carotid: Velocities in the right ICA are consistent with a 1-39%  stenosis.  Left Carotid: Velocities in the left ICA are consistent with  a 1-39%  stenosis.  Vertebrals: Bilateral vertebral arteries demonstrate antegrade flow.   Carotid artery duplex 12/03/2018: No evidence of significant stenosis in the right carotid vessels. 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 Minimal plaque noted. Antegrade right vertebral artery flow. Antegrade left vertebral artery flow. Follow up in one year is appropriate if clinically indicated.  MRI Brain 02/10/2019: 1. No acute intracranial  abnormality. 2. Progressive chronic small vessel ischemic disease from 2010 including multiple lacunar infarcts as above.   Recent labs: 06/02/2020: Glucose 70, BUN/Cr 24/0.72. EGFR >60. Na/K 137/4.1. Albumin 2.7, Mg 1.8. Rest of the CMP normal H/H 10/33. MCV 92. Platelets 584 HbA1C 10.3%  2013: Chol 172, TG 125, HDL 101, LDL 46 TSH 0.5 normal  09/06/2018: Glucose 237.  BUN/creatinine 15/0.85.  EGFR 67/73 with sodium 137, potassium 4.3.  Rest of the CMP normal. H/H 13/39.  MCV 87.  Platelets 206. Abdomen A1c 8.0% Cholesterol 163, triglycerides 153, HDL 76, LDL 64.   Review of Systems  Cardiovascular: Negative for chest pain, dyspnea on exertion, leg swelling, palpitations and syncope.         Vitals:   07/07/20 1000  BP: (!) 128/50  Pulse: 83  Resp: 17  SpO2: 97%     Objective:    Physical Exam Vitals and nursing note reviewed.  Constitutional:      General: She is not in acute distress. Neck:     Vascular: No JVD.  Cardiovascular:     Rate and Rhythm: Normal rate and regular rhythm.     Heart sounds: Normal heart sounds. No murmur heard.   Pulmonary:     Effort: Pulmonary effort is normal.     Breath sounds: Normal breath sounds. No wheezing or rales.           Assessment & Recommendations:   79 y/o [place] American female with hypertension, uncontrolled type 2 DM, septic shock secondary to Ludwig's angina (05/2020)-now resolved, now with left-sided chest pain  Chest pain: Atypical in nature.  However, patient has risk factors with hypertension, and uncontrolled type 2 diabetes mellitus.  Underlying EKG shows right bundle branch block and left intrafascicular block.  Recommend Lexiscan nuclear stress test and echocardiogram for further evaluation. Continue aspirin 81 mg daily.  Prescribed nitroglycerin for as needed use. Continue lisinopril 5 mg daily, Crestor 10 mg daily. Will check lipid panel.  Follow-up after above testing.  Nigel Mormon, MD [facility] Pager: [phone] Office: [phone] If no answer Cell [phone]

## 2020-07-07 ENCOUNTER — Other Ambulatory Visit: Payer: Self-pay

## 2020-07-07 ENCOUNTER — Encounter: Payer: Self-pay | Admitting: Cardiology

## 2020-07-07 ENCOUNTER — Ambulatory Visit: Payer: Medicare Other | Admitting: Cardiology

## 2020-07-07 VITALS — BP 128/50 | HR 83 | Resp 17 | Ht 66.0 in | Wt 116.0 lb

## 2020-07-07 DIAGNOSIS — E1165 Type 2 diabetes mellitus with hyperglycemia: Secondary | ICD-10-CM | POA: Insufficient documentation

## 2020-07-07 DIAGNOSIS — I10 Essential (primary) hypertension: Secondary | ICD-10-CM

## 2020-07-07 DIAGNOSIS — E782 Mixed hyperlipidemia: Secondary | ICD-10-CM

## 2020-07-07 DIAGNOSIS — R0789 Other chest pain: Secondary | ICD-10-CM

## 2020-07-07 MED ORDER — LISINOPRIL 5 MG PO TABS: 5.0000 mg | ORAL_TABLET | Freq: Every day | ORAL | 2 refills | Status: DC

## 2020-07-07 MED ORDER — NITROGLYCERIN 0.4 MG SL SUBL: 0.4000 mg | SUBLINGUAL_TABLET | SUBLINGUAL | 3 refills | Status: DC | PRN

## 2020-07-16 ENCOUNTER — Other Ambulatory Visit (HOSPITAL_COMMUNITY): Payer: Self-pay | Admitting: Cardiology

## 2020-07-17 LAB — LIPID PANEL
Chol/HDL Ratio: 1.7 ratio (ref 0.0–4.4)
Cholesterol, Total: 145 mg/dL (ref 100–199)
HDL: 85 mg/dL (ref >39)
LDL Chol Calc (NIH): 34 mg/dL (ref 0–99)
Triglycerides: 161 mg/dL — ABNORMAL HIGH (ref 0–149)
VLDL Cholesterol Cal: 26 mg/dL (ref 5–40)

## 2020-07-21 ENCOUNTER — Ambulatory Visit: Payer: Medicare Other

## 2020-07-21 ENCOUNTER — Other Ambulatory Visit: Payer: Self-pay

## 2020-07-21 DIAGNOSIS — R0789 Other chest pain: Secondary | ICD-10-CM

## 2020-07-23 ENCOUNTER — Other Ambulatory Visit: Payer: Self-pay

## 2020-07-23 ENCOUNTER — Other Ambulatory Visit: Payer: Medicare Other

## 2020-07-23 ENCOUNTER — Ambulatory Visit: Payer: Medicare Other

## 2020-07-23 DIAGNOSIS — R0789 Other chest pain: Secondary | ICD-10-CM

## 2020-07-28 ENCOUNTER — Other Ambulatory Visit: Payer: Self-pay

## 2020-07-28 ENCOUNTER — Ambulatory Visit: Payer: Medicare Other | Admitting: Cardiology

## 2020-07-28 ENCOUNTER — Encounter: Payer: Self-pay | Admitting: Cardiology

## 2020-07-28 VITALS — BP 111/48 | HR 89 | Resp 16 | Ht 66.0 in | Wt 115.0 lb

## 2020-07-28 DIAGNOSIS — E782 Mixed hyperlipidemia: Secondary | ICD-10-CM

## 2020-07-28 DIAGNOSIS — I10 Essential (primary) hypertension: Secondary | ICD-10-CM

## 2020-07-28 DIAGNOSIS — R0789 Other chest pain: Secondary | ICD-10-CM

## 2020-07-28 DIAGNOSIS — E1165 Type 2 diabetes mellitus with hyperglycemia: Secondary | ICD-10-CM

## 2020-07-28 MED ORDER — METOPROLOL SUCCINATE ER 25 MG PO TB24
12.5000 mg | ORAL_TABLET | Freq: Every day | ORAL | 3 refills | Status: DC
Start: 2020-07-28 — End: 2021-01-24

## 2020-07-28 MED ORDER — LISINOPRIL 5 MG PO TABS: 2.5000 mg | ORAL_TABLET | Freq: Every day | ORAL | 2 refills | Status: DC

## 2020-07-28 NOTE — Progress Notes (Signed)
Subjective:   Katherine [name], female    DOB: October 12, 1941, 79 y.o.   MRN: [id number]  Chief complaint:  Dizziness   HPI  79 y/o [place] American female with hypertension, uncontrolled type 2 DM, septic shock secondary to Ludwig's angina (05/2020)-now resolved, now with left-sided chest pain  She is here for f/u. She continues to have episodes of chest pain, usually at rest, that do resovled with SL NTG. Echocardiogram, stress test results discussed with the patient, details below.   Office visit 06/2020: Patient was hospitalized in July 2021 with Ludwig's angina, airway obstruction requiring intubation, severe sepsis. She is to undergo incision and drainage of neck abscess with drain placement.  Patient recovered well from this hospitalization.  She still has dressing at the site of incision and drainage.  She is here today with complaints of left-sided chest pain.  She is not able to qualify or quantify the pain well to me.  She says "pain is a pain".  She does not have any specific exertional component, pain occurs both at rest as well as with regular activity at home.  Pain lasts for few minutes at a time, and is usually self resolving.  She does report associated shortness of breath.  Initial consultation HPI 02/2019: Patient has been seen by me, as well as Neurologists Dr. Marjory Lies ([facility]) and Tat [facility] neurology). Her cardiovascular workup is unremarkable to explain her symptoms. Dr. Arbutus Leas felt that she did not meet criteria for Parkinson's disease. While her symptoms appear postural in nature, her orthostatic vitals have been negative. It appears that patient's PCP Dr. Parke Simmers had ordered a brain MRI. MRI shows progressive chronic small vessel ischemic disease from 2010, including multiple lacunar infarcts. Patient does not have any other neurological symptoms, including weakness, numbness, speech abnormalities, facial droop, visual disturbances etc.     Current Outpatient Medications on  File Prior to Visit  Medication Sig Dispense Refill  . acetaminophen (TYLENOL) 500 MG tablet Take 500-1,000 mg by mouth every 6 (six) hours as needed (for pain).    . citalopram (CELEXA) 10 MG tablet Take 1 tablet by mouth once as needed.    . gabapentin (NEURONTIN) 300 MG capsule Take 1 capsule (300 mg total) by mouth 2 (two) times daily. 60 capsule 2  . glimepiride (AMARYL) 2 MG tablet Take 1 tablet (2 mg total) by mouth daily with breakfast. 30 tablet 2  . lisinopril (ZESTRIL) 5 MG tablet Take 1 tablet (5 mg total) by mouth daily. 90 tablet 2  . metFORMIN (GLUCOPHAGE) 500 MG tablet Take 1 tablet (500 mg total) by mouth 2 (two) times daily with a meal. 60 tablet 2  . mirtazapine (REMERON) 30 MG tablet TAKE 1 TABLET(30 MG) BY MOUTH AT BEDTIME (Patient taking differently: Take 30 mg by mouth at bedtime as needed (for sleep). ) 30 tablet 3  . Multiple Vitamin (MULTIVITAMIN) tablet Take 1 tablet by mouth 3 (three) times a week.     . nitroGLYCERIN (NITROSTAT) 0.4 MG SL tablet Place 1 tablet (0.4 mg total) under the tongue every 5 (five) minutes as needed for chest pain. 30 tablet 3  . rosuvastatin (CRESTOR) 10 MG tablet TAKE 1 TABLET(10 MG) BY MOUTH DAILY 90 tablet 3  . timolol (TIMOPTIC) 0.5 % ophthalmic solution Place 1 drop into both eyes 2 (two) times daily.      No current facility-administered medications on file prior to visit.    Cardiovascular studies:  Echocardiogram 07/23/2020:  Normal LV systolic function  with EF 55%. Left ventricle cavity is normal  in size. Normal global wall motion. Doppler evidence of grade I (impaired)  diastolic dysfunction, normal LAP. Calculated EF 55%.   Lexiscan Tetrofosmin stress test 07/21/2020: Lexiscan nuclear stress test performed using 1-day protocol. Normal myocardial perfusion. Stress LVEF 70%. Low risk study.    EKG 07/07/2020: Sinus rhythm 81 bpm Incomplete RBBB, LAFBLeft atrial enlargement  Carotid US 09/2019: Right Carotid: Velocities  in the right ICA are consistent with a 1-39%  stenosis.  Left Carotid: Velocities in the left ICA are consistent with a 1-39%  stenosis.  Vertebrals: Bilateral vertebral arteries demonstrate antegrade flow.   Carotid artery duplex 12/03/2018: No evidence of significant stenosis in the right carotid vessels. 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 Minimal plaque noted. Antegrade right vertebral artery flow. Antegrade left vertebral artery flow. Follow up in one year is appropriate if clinically indicated.  MRI Brain 02/10/2019: 1. No acute intracranial abnormality. 2. Progressive chronic small vessel ischemic disease from 2010 including multiple lacunar infarcts as above.   Recent labs: 07/16/2020: Chol 145, TG 161, HDL 85, LDL 34  06/02/2020: Glucose 70, BUN/Cr 24/0.72. EGFR >60. Na/K 137/4.1. Albumin 2.7, Mg 1.8. Rest of the CMP normal H/H 10/33. MCV 92. Platelets 584 HbA1C 10.3%  2013: Chol 172, TG 125, HDL 101, LDL 46 TSH 0.5 normal  09/06/2018: Glucose 237.  BUN/creatinine 15/0.85.  EGFR 67/73 with sodium 137, potassium 4.3.  Rest of the CMP normal. H/H 13/39.  MCV 87.  Platelets 206. Abdomen A1c 8.0% Cholesterol 163, triglycerides 153, HDL 76, LDL 64.   Review of Systems  Cardiovascular: Negative for chest pain, dyspnea on exertion, leg swelling, palpitations and syncope.         Vitals:   07/28/20 1332  BP: (!) 111/48  Pulse: 89  Resp: 16  SpO2: 99%     Objective:    Physical Exam Vitals and nursing note reviewed.  Constitutional:      General: She is not in acute distress. Neck:     Vascular: No JVD.  Cardiovascular:     Rate and Rhythm: Normal rate and regular rhythm.     Heart sounds: Normal heart sounds. No murmur heard.   Pulmonary:     Effort: Pulmonary effort is normal.     Breath sounds: Normal breath sounds. No wheezing or rales.           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  No ischemia on stress test. However, given her nitrate responsive pain, could empirically treat medically for angina. She does not want to be on nitrate due to the fear of headaches. Thus, started metoprolol succinate 12.g mg daily. Given her headache, reduced lisinopril to 2.5 mg daily.  Continue aspirin 81 mg daily, Crestor 10 mg daily. As needed SL NTG. Lipids well controlled.   F/u in 6 weeks  Jasalyn Frysinger Emiliano Dyer, MD [facility] Pager: [phone] Office: [phone] If no answer Cell [phone]

## 2020-09-08 ENCOUNTER — Ambulatory Visit: Payer: Medicare Other | Admitting: Cardiology
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
## 2020-10-19 ENCOUNTER — Encounter (INDEPENDENT_AMBULATORY_CARE_PROVIDER_SITE_OTHER): Payer: Self-pay | Admitting: Ophthalmology

## 2020-10-19 ENCOUNTER — Ambulatory Visit (INDEPENDENT_AMBULATORY_CARE_PROVIDER_SITE_OTHER): Payer: Medicare Other | Admitting: Ophthalmology

## 2020-10-19 ENCOUNTER — Other Ambulatory Visit: Payer: Self-pay

## 2020-10-19 DIAGNOSIS — H3581 Retinal edema: Secondary | ICD-10-CM | POA: Diagnosis not present

## 2020-10-19 DIAGNOSIS — H2701 Aphakia, right eye: Secondary | ICD-10-CM

## 2020-10-19 DIAGNOSIS — I10 Essential (primary) hypertension: Secondary | ICD-10-CM | POA: Diagnosis not present

## 2020-10-19 DIAGNOSIS — H54511A Low vision right eye category 1, normal vision left eye: Secondary | ICD-10-CM

## 2020-10-19 DIAGNOSIS — H43822 Vitreomacular adhesion, left eye: Secondary | ICD-10-CM | POA: Diagnosis not present

## 2020-10-19 DIAGNOSIS — H401113 Primary open-angle glaucoma, right eye, severe stage: Secondary | ICD-10-CM

## 2020-10-19 DIAGNOSIS — H25812 Combined forms of age-related cataract, left eye: Secondary | ICD-10-CM

## 2020-10-19 DIAGNOSIS — H35033 Hypertensive retinopathy, bilateral: Secondary | ICD-10-CM

## 2020-10-19 DIAGNOSIS — H35372 Puckering of macula, left eye: Secondary | ICD-10-CM | POA: Diagnosis not present

## 2020-10-20 ENCOUNTER — Encounter (INDEPENDENT_AMBULATORY_CARE_PROVIDER_SITE_OTHER): Payer: Self-pay | Admitting: Ophthalmology

## 2020-11-08 ENCOUNTER — Other Ambulatory Visit: Payer: Self-pay | Admitting: Cardiology

## 2020-11-08 DIAGNOSIS — R0789 Other chest pain: Secondary | ICD-10-CM

## 2020-11-19 NOTE — Progress Notes (Signed)
[facility] Note  11/23/2020     CHIEF COMPLAINT Patient presents for Retina Follow Up   HISTORY OF PRESENT ILLNESS: Katherine [name] is a 80 y.o. female who presents to the clinic today for:   HPI    Retina Follow Up    Patient presents with  Other.  In left eye.  Duration of 5 weeks.  Since onset it is stable.  I, the attending physician,  performed the HPI with the patient and updated documentation appropriately.          Comments    5 week follow up VMT OS- At times she will have a FB sensation OD>OS.  After using Timolol it helps. Using Timolol BID OU and Restasis BID OU.  BS 171 Friday A1C: unsure (10.3 05/2020)       Last edited by [name], MD on 11/25/2020  3:48 PM. (History)    pt states her eye sometimes feels like it has something in it, but when she uses Timolol it seems to help, Dr. [name] gave her a rx for Restasis which she is using BID OU  Referring physician: Lucianne Lei, MD [address] [place],  [place] [address]  HISTORICAL INFORMATION:   Selected notes from the MEDICAL RECORD NUMBER Referred by Dr. [name] for eval of ret traction OS.   CURRENT MEDICATIONS: Current Outpatient Medications (Ophthalmic Drugs)  Medication Sig   timolol (TIMOPTIC) 0.5 % ophthalmic solution Place 1 drop into both eyes 2 (two) times daily.    No current facility-administered medications for this visit. (Ophthalmic Drugs)   Current Outpatient Medications (Other)  Medication Sig   acetaminophen (TYLENOL) 500 MG tablet Take 500-1,000 mg by mouth every 6 (six) hours as needed (for pain).   citalopram (CELEXA) 10 MG tablet Take 1 tablet by mouth once as needed.   gabapentin (NEURONTIN) 300 MG capsule Take 1 capsule (300 mg total) by mouth 2 (two) times daily.   glimepiride (AMARYL) 2 MG tablet Take 1 tablet (2 mg total) by mouth daily with breakfast.   lisinopril (ZESTRIL) 5 MG tablet Take 0.5 tablets (2.5 mg total) by mouth  daily.   metFORMIN (GLUCOPHAGE) 500 MG tablet Take 1 tablet (500 mg total) by mouth 2 (two) times daily with a meal.   mirtazapine (REMERON) 30 MG tablet TAKE 1 TABLET(30 MG) BY MOUTH AT BEDTIME (Patient taking differently: Take 30 mg by mouth at bedtime as needed (for sleep).)   Multiple Vitamin (MULTIVITAMIN) tablet Take 1 tablet by mouth 3 (three) times a week.    nitroGLYCERIN (NITROSTAT) 0.4 MG SL tablet DISSOLVE 1 TABLET UNDER THE TONGUE EVERY 5 MINUTES AS NEEDED FOR CHEST PAIN   rosuvastatin (CRESTOR) 10 MG tablet TAKE 1 TABLET(10 MG) BY MOUTH DAILY   metoprolol succinate (TOPROL-XL) 25 MG 24 hr tablet Take 0.5 tablets (12.5 mg total) by mouth daily. Take with or immediately following a meal.   No current facility-administered medications for this visit. (Other)      REVIEW OF SYSTEMS: ROS    Positive for: Genitourinary, Musculoskeletal, Endocrine, Cardiovascular, Eyes   Negative for: Constitutional, Gastrointestinal, Neurological, Skin, HENT, Respiratory, Psychiatric, Allergic/Imm, Heme/Lymph   Last edited by Leonie Douglas, COA on 11/23/2020  1:37 PM. (History)       ALLERGIES No Known Allergies  PAST MEDICAL HISTORY Past Medical History:  Diagnosis Date   Essential tremor 09/25/2019   Exotropia of right eye    AND HYPERTROPIA  Glaucoma    Hyperlipidemia    Hypertension    Incomplete right bundle branch block    LAFB (left anterior fascicular block)    SUI (stress urinary incontinence, female)    Type 2 diabetes mellitus ([place])    Wears dentures    upper denture and lower partial denture   Wears glasses    Past Surgical History:  Procedure Laterality Date   ADJUSTABLE SUTURE MANIPULATION Right 03/29/2016   Procedure: ADJUSTABLE SUTURE RIGHT EYE LATERAL RECTUS RESECTION RIGHT EYE ;  Surgeon: Gevena Cotton, MD;  Location: [facility];  Service: Ophthalmology;  Laterality: Right;   CHOLECYSTECTOMY OPEN  1968   EYE SURGERY  age 59    repair right eye injury   GLAUCOMA SURGERY Right 08-23-2015  at [place] N/A 05/20/2020   Procedure: INCISION AND DRAINAGE OF NECK ABSCESS;  Surgeon: Izora Gala, MD;  Location: [facility];  Service: ENT;  Laterality: N/A;   INTUBATION-ENDOTRACHEAL WITH TRACHEOSTOMY STANDBY N/A 05/19/2020   Procedure: Warrick Parisian WITH TRACHEOSTOMY STANDBY;  Surgeon: Jerrell Belfast, MD;  Location: [facility];  Service: ENT;  Laterality: N/A;   MEDIAN RECTUS REPAIR Right 03/29/2016   Procedure: MEDIAN RECTUS RESECTION RIGHT EYE ADJUSTABLE SUTURE RIGHT EYE LATERAL RECTUS RESECTION RIGHT EYE ;  Surgeon: Gevena Cotton, MD;  Location: [facility];  Service: Ophthalmology;  Laterality: Right;    FAMILY HISTORY Family History  Problem Relation Age of Onset   Emphysema Father    Heart failure Father    Heart failure Sister    COPD Brother     SOCIAL HISTORY Social History   Tobacco Use   Smoking status: Former Smoker    Packs/day: 0.50    Years: 8.00    Pack years: 4.00    Types: Cigarettes    Quit date: 2020    Years since quitting: 2.0   Smokeless tobacco: Never Used   Tobacco comment: reports 3 cigarettes per day  Vaping Use   Vaping Use: Never used  Substance Use Topics   Alcohol use: Yes    Comment: "occasionally" I have a beer.  estimates 4 beers/week   Drug use: No         OPHTHALMIC EXAM:  Base Eye Exam    Visual Acuity (Snellen - Linear)      Right Left   Dist cc HM _0 ' 20/25 +1   Dist ph cc NI NI   Correction: Glasses  Unable to count fingers today OD.         Tonometry (Tonopen, 1:45 PM)      Right Left   Pressure 18 17       Pupils      Dark Light Shape React APD   Right   Irregular NR    Left 3 2 Round Brisk None       Visual Fields (Counting fingers)      Left Right    Full    Restrictions  Total superior temporal, inferior temporal, superior nasal, inferior nasal deficiencies       Extraocular  Movement      Right Left    Full Full       Neuro/Psych    Oriented x3: Yes   Mood/Affect: Normal       Dilation    Both eyes: 1.0% Mydriacyl, 2.5% Phenylephrine @ 1:45 PM        Slit Lamp and Fundus Exam    Slit Lamp Exam  Right Left   Lids/Lashes Dermatochalasis - upper lid Dermatochalasis - upper lid   Conjunctiva/Sclera mild melanosis mild melanosis   Cornea Arcus, diffuse haze/edema, endo pigment, Descemet's folds Arcus, trace Punctate epithelial erosions   Anterior Chamber deep, narrow angles deep, narrow angles, No cell or flare   Iris irregular Round and dilated   Lens Aphakia 3+ Nuclear sclerosis with brunescence, 3+ Cortical cataract   Vitreous very hazy view Vitreous syneresis Vitreous syneresis       Fundus Exam      Right Left   Disc hazy view, 3+ Pallor, +cupping, Sharp rim Pink and Sharp, Compact   C/D Ratio  0.3   Macula hazy view, grossly flat, RPE mottling and clumping, no details visible Flat, Blunted foveal reflex, central Cystic changes/VMT, RPE mottling and clumping, no heme   Vessels hazy view, attenuated, Tortuous attenuated, Tortuous, mild Copper wiring, mild AV crossing changes   Periphery hazy view, grossly attached Attached           Refraction    Wearing Rx      Sphere Cylinder Axis Add   Right +4.00 +0.75 146 +2.50   Left +3.50 +0.75 073 +2.50          IMAGING AND PROCEDURES  Imaging and Procedures for 11/23/2020  OCT, Retina - OU - Both Eyes       Right Eye Quality was poor.   Left Eye Quality was good. Central Foveal Thickness: 301. Progression has been stable. Findings include abnormal foveal contour, vitreous traction, intraretinal fluid, no SRF, epiretinal membrane (No significant change from prior).   Notes *Images captured and stored on drive  Diagnosis / Impression:  OD: no view today OS: +ERM; +VMT w/ central macular cysts; no SRF -- No significant change from prior  Clinical management:  See  below  Abbreviations: NFP - Normal foveal profile. CME - cystoid macular edema. PED - pigment epithelial detachment. IRF - intraretinal fluid. SRF - subretinal fluid. EZ - ellipsoid zone. ERM - epiretinal membrane. ORA - outer retinal atrophy. ORT - outer retinal tubulation. SRHM - subretinal hyper-reflective material. IRHM - intraretinal hyper-reflective material                 ASSESSMENT/PLAN:    ICD-10-CM   1. 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 Epiretinal membrane (ERM) of left eye  H35.372   3. Retinal edema  H35.81 OCT, Retina - OU - Both Eyes  4. Essential hypertension  I10   5. Hypertensive retinopathy of both eyes  H35.033   6. Combined forms of age-related cataract of left eye  H25.812   7. Low vision of right eye  H54.511A   8. Aphakia, right  H27.01   9. Primary open angle glaucoma (POAG) of right eye, severe stage  H40.1113     1-3. VMT and ERM OS  - central vitreomacular traction with cystic changes and +ERM - no significant change from prior on exam and OCT  - BCVA 20/25 +1  - pt denies metamorphopsia  - discuss findings, prognosis and treatment options  - recommend observation for now  - f/u 2 months, DFE, OCT  4,5. Hypertensive retinopathy OU - discussed importance of tight BP control - monitor  6. Mixed Cataract OS - The symptoms of cataract, surgical options, and treatments and risks were discussed with patient. - discussed diagnosis and progression - approaching visual significance  7,8. Low vision and aphakia OD  - long standing history of low vision OD  -  history of trauma and cataract OD as a child and underwent cataract extraction as a juvenile  - remains aphakic with corneal haze and edema  - BCVA HM OD  - stable  9. POAG OD  - IOP today: 18  - on timoptic QAM  - under the expert management of Dr. [name]  Ophthalmic Meds Ordered this visit:  No orders of the defined types were placed in this encounter.     Return in about  2 months (around 01/21/2021) for f/u VMT / ERM OS, DFE, OCT.  There are no Patient Instructions on file for this visit.  Explained the diagnoses, plan, and follow up with the patient and they expressed understanding.  Patient expressed understanding of the importance of proper follow up care.  This document serves as a record of services personally performed by Gardiner Sleeper, MD, PhD. It was created on their behalf by San Jetty. Owens Shark, OA an ophthalmic technician. The creation of this record is the provider's dictation and/or activities during the visit.    Electronically signed by: San Jetty. Owens Shark, [place] 01.11.2022 3:50 PM  Gardiner Sleeper, M.D., Ph.D. Diseases & Surgery of the Retina and [place] 11/23/2020   I have reviewed the above documentation for accuracy and completeness, and I agree with the above. Gardiner Sleeper, M.D., Ph.D. 11/25/20 3:50 PM   Abbreviations: M myopia (nearsighted); A astigmatism; H hyperopia (farsighted); P presbyopia; Mrx spectacle prescription;  CTL contact lenses; OD right eye; OS left eye; OU both eyes  XT exotropia; ET esotropia; PEK punctate epithelial keratitis; PEE punctate epithelial erosions; DES dry eye syndrome; MGD meibomian gland dysfunction; ATs artificial tears; PFAT's preservative free artificial tears; [place] nuclear sclerotic cataract; PSC posterior subcapsular cataract; ERM epi-retinal membrane; PVD posterior vitreous detachment; RD retinal detachment; DM diabetes mellitus; DR diabetic retinopathy; NPDR non-proliferative diabetic retinopathy; PDR proliferative diabetic retinopathy; CSME clinically significant macular edema; DME diabetic macular edema; dbh dot blot hemorrhages; CWS cotton wool spot; POAG primary open angle glaucoma; C/D cup-to-disc ratio; HVF humphrey visual field; GVF goldmann visual field; OCT optical coherence tomography; IOP intraocular pressure; BRVO Branch retinal vein occlusion; CRVO central  retinal vein occlusion; CRAO central retinal artery occlusion; BRAO branch retinal artery occlusion; RT retinal tear; SB scleral buckle; PPV pars plana vitrectomy; VH Vitreous hemorrhage; PRP panretinal laser photocoagulation; IVK intravitreal kenalog; VMT vitreomacular traction; MH Macular hole;  NVD neovascularization of the disc; NVE neovascularization elsewhere; AREDS age related eye disease study; ARMD age related macular degeneration; POAG primary open angle glaucoma; EBMD epithelial/anterior basement membrane dystrophy; ACIOL anterior chamber intraocular lens; IOL intraocular lens; PCIOL posterior chamber intraocular lens; Phaco/IOL phacoemulsification with intraocular lens placement; [place] photorefractive keratectomy; LASIK laser assisted in situ keratomileusis; HTN hypertension; DM diabetes mellitus; COPD chronic obstructive pulmonary disease

## 2020-11-23 ENCOUNTER — Other Ambulatory Visit: Payer: Self-pay

## 2020-11-23 ENCOUNTER — Ambulatory Visit (INDEPENDENT_AMBULATORY_CARE_PROVIDER_SITE_OTHER): Payer: Medicare Other | Admitting: Ophthalmology

## 2020-11-23 DIAGNOSIS — I10 Essential (primary) hypertension: Secondary | ICD-10-CM | POA: Diagnosis not present

## 2020-11-23 DIAGNOSIS — H3581 Retinal edema: Secondary | ICD-10-CM | POA: Diagnosis not present

## 2020-11-23 DIAGNOSIS — H401113 Primary open-angle glaucoma, right eye, severe stage: Secondary | ICD-10-CM

## 2020-11-23 DIAGNOSIS — H43822 Vitreomacular adhesion, left eye: Secondary | ICD-10-CM

## 2020-11-23 DIAGNOSIS — H54511A Low vision right eye category 1, normal vision left eye: Secondary | ICD-10-CM

## 2020-11-23 DIAGNOSIS — H25812 Combined forms of age-related cataract, left eye: Secondary | ICD-10-CM

## 2020-11-23 DIAGNOSIS — H35372 Puckering of macula, left eye: Secondary | ICD-10-CM

## 2020-11-23 DIAGNOSIS — H35033 Hypertensive retinopathy, bilateral: Secondary | ICD-10-CM

## 2020-11-23 DIAGNOSIS — H2701 Aphakia, right eye: Secondary | ICD-10-CM

## 2020-11-25 ENCOUNTER — Encounter (INDEPENDENT_AMBULATORY_CARE_PROVIDER_SITE_OTHER): Payer: Self-pay | Admitting: Ophthalmology

## 2020-12-14 ENCOUNTER — Encounter: Payer: Self-pay | Admitting: Diagnostic Neuroimaging

## 2020-12-14 ENCOUNTER — Other Ambulatory Visit: Payer: Self-pay

## 2020-12-14 ENCOUNTER — Ambulatory Visit (INDEPENDENT_AMBULATORY_CARE_PROVIDER_SITE_OTHER): Payer: Medicare Other | Admitting: Diagnostic Neuroimaging

## 2020-12-14 ENCOUNTER — Telehealth: Payer: Self-pay | Admitting: Diagnostic Neuroimaging

## 2020-12-14 VITALS — BP 122/52 | HR 84 | Ht 66.0 in | Wt 126.4 lb

## 2020-12-14 DIAGNOSIS — R269 Unspecified abnormalities of gait and mobility: Secondary | ICD-10-CM | POA: Diagnosis not present

## 2020-12-14 DIAGNOSIS — R42 Dizziness and giddiness: Secondary | ICD-10-CM | POA: Diagnosis not present

## 2020-12-14 NOTE — Telephone Encounter (Signed)
Medicare/aetna order sent to [facility]. They will obtain the auth for aetna and reach out to the patient to schedule.  

## 2020-12-14 NOTE — Patient Instructions (Signed)
  GAIT DIFF (due to stroke, neuropathy) - check MRI cervical spine (rule out cervical myelopathy) - continue PT exercises - use cane/walker at all times  ORTHOSTATIC DIZZINESS (may be related to diabetic neuropathy, right eye blindness and anxiety) - use cane/walker; maintain BP log and follow up with PCP  MULTIPLE LACUNAR STROKES  - continue stroke prevention (aspirin, diabetes tx, lipid mgmt, BP control)  DEPRESSION / ANXIETY - consider therapist/counselor to help with stress / depression

## 2020-12-14 NOTE — Addendum Note (Signed)
Addended by: Andrey Spearman R on: 12/14/2020 12:06 PM   Modules accepted: Orders

## 2020-12-14 NOTE — Progress Notes (Addendum)
[facility]  PATIENT: Katherine [name] DOB: [birth year]  REFERRING CLINICIAN: P[place]ock HISTORY FROM: patient REASON FOR VISIT: follow up   HISTORICAL  CHIEF COMPLAINT:  Chief Complaint  Patient presents with  . Dizziness    Rm 7 "dizziness every day when I stand up, no dizziness when in bed"    HISTORY OF PRESENT ILLNESS:   UPDATE (12/14/20, VRP): Since last visit, doing poorly. More dizziness and balance issues. No falls.  Symptoms are progressive. Severity is moderate. No alleviating or aggravating factors. Tolerating meds.    UPDATE 06/12/17: Since last visit, was having dizziness with carb/levo. So we advised her to stop and then take log of BP. Patient stopped carb/levo, but did not bring her log of BP today. Patient is orthostatic on her vitals today. Patient has been to PT twice. She has a cane at home, but doesn't need   UPDATE 02/20/17: Since last visit symptoms are stable. Has not had PT yet. Here with husband. Asking about meds, PT, and treatment options. No major falls. Some minor trips.   PRIOR HPI (11/20/16): 80 year old right-handed female here for evaluation of dizziness. For past few months (at least since June or July 2017) patient notes a "dizziness" sensation which she describes as feeling off balance, swimmy headed, movement in her head. Symptoms fairly stable over past few months. She denies room spinning sensation, nausea or vomiting. No positional trigger. Symptoms worse when she standing and walking. She does not feel this when she is sitting up, laying down or turning her head around. Patient has noted generalized slowing, fatigue, slow movements for almost 1 year. She also has significant depression, anxiety, poor sleep, decreased energy, disinterest in activities and stress at home. 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   REVIEW OF SYSTEMS:  Full 14 system review of systems performed and negative with exception of: decr appetite fatigue dizziness numbness blurred vision light sensitivity frequent waking.   ALLERGIES: No Known Allergies  HOME MEDICATIONS: Outpatient Medications Prior to Visit  Medication Sig Dispense Refill  . acetaminophen (TYLENOL) 500 MG tablet Take 500-1,000 mg by mouth every 6 (six) hours as needed (for pain).    Marland Kitchen aspirin 81 MG EC tablet Take by mouth. 12/14/20 takes 3 x weekly    . citalopram (CELEXA) 10 MG tablet Take 1 tablet by mouth once as needed.    Marland Kitchen glimepiride (AMARYL) 2 MG tablet Take 1 tablet (2 mg total) by mouth daily with breakfast. 30 tablet 2  . lisinopril (ZESTRIL) 5 MG tablet Take 0.5 tablets (2.5 mg total) by mouth daily. 90 tablet 2  . metFORMIN (GLUCOPHAGE) 500 MG tablet Take 1 tablet (500 mg total) by mouth 2 (two) times daily with a meal. 60 tablet 2  . mirtazapine (REMERON) 30 MG tablet TAKE 1 TABLET(30 MG) BY MOUTH AT BEDTIME (Patient taking differently: Take 30 mg by mouth at bedtime as needed (for sleep).) 30 tablet 3  . Multiple Vitamin (MULTIVITAMIN) tablet Take 1 tablet by mouth 3 (three) times a week.     . nitroGLYCERIN (NITROSTAT) 0.4 MG SL tablet DISSOLVE 1 TABLET UNDER THE TONGUE EVERY 5 MINUTES AS NEEDED FOR CHEST PAIN 25 tablet 2  . RESTASIS MULTIDOSE 0.05 % ophthalmic emulsion 1 drop 2 (two) times daily.    . rosuvastatin (CRESTOR) 10 MG tablet TAKE 1 TABLET(10 MG) BY MOUTH DAILY 90 tablet 3  . timolol (TIMOPTIC)  0.5 % ophthalmic solution Place 1 drop into both eyes 2 (two) times daily.     Marland Kitchen gabapentin (NEURONTIN) 300 MG capsule Take 1 capsule (300 mg total) by mouth 2 (two) times daily. (Patient not taking: Reported on 12/14/2020) 60 capsule 2  . metoprolol succinate (TOPROL-XL) 25 MG 24 hr tablet Take 0.5 tablets (12.5 mg total) by mouth daily. Take with or immediately following a meal. 30 tablet 3  . quinapril (ACCUPRIL) 10 MG tablet Take by mouth.     No  facility-administered medications prior to visit.    PAST MEDICAL HISTORY: Past Medical History:  Diagnosis Date  . Essential tremor 09/25/2019  . Exotropia of right eye    AND HYPERTROPIA  . Glaucoma   . Hyperlipidemia   . Hypertension   . Incomplete right bundle branch block   . LAFB (left anterior fascicular block)   . SUI (stress urinary incontinence, female)   . Type 2 diabetes mellitus ([place])   . Wears dentures    upper denture and lower partial denture  . Wears glasses     PAST SURGICAL HISTORY: Past Surgical History:  Procedure Laterality Date  . ADJUSTABLE SUTURE MANIPULATION Right 03/29/2016   Procedure: ADJUSTABLE SUTURE RIGHT EYE LATERAL RECTUS RESECTION RIGHT EYE ;  Surgeon: Gevena Cotton, MD;  Location: [facility];  Service: Ophthalmology;  Laterality: Right;  . CHOLECYSTECTOMY OPEN  1968  . EYE SURGERY  age 80   repair right eye injury  . GLAUCOMA SURGERY Right 08-23-2015  at [facility]  . INCISION AND DRAINAGE ABSCESS N/A 05/20/2020   Procedure: INCISION AND DRAINAGE OF NECK ABSCESS;  Surgeon: Izora Gala, MD;  Location: [place];  Service: ENT;  Laterality: N/A;  . INTUBATION-ENDOTRACHEAL WITH TRACHEOSTOMY STANDBY N/A 05/19/2020   Procedure: Warrick Parisian WITH TRACHEOSTOMY STANDBY;  Surgeon: Jerrell Belfast, MD;  Location: [place];  Service: ENT;  Laterality: N/A;  . MEDIAN RECTUS REPAIR Right 03/29/2016   Procedure: MEDIAN RECTUS RESECTION RIGHT EYE ADJUSTABLE SUTURE RIGHT EYE LATERAL RECTUS RESECTION RIGHT EYE ;  Surgeon: Gevena Cotton, MD;  Location: [facility];  Service: Ophthalmology;  Laterality: Right;    FAMILY HISTORY: Family History  Problem Relation Age of Onset  . Emphysema Father   . Heart failure Father   . Heart failure Sister   . COPD Brother     SOCIAL HISTORY:  Social History   Socioeconomic History  . Marital status: Married    Spouse name: Stephanie Coup  . Number of children: 3  . Years of education: 97   . Highest education level: Not on file  Occupational History  . Occupation: retired    Comment: [employer]  Tobacco Use  . Smoking status: Former Smoker    Packs/day: 0.50    Years: 8.00    Pack years: 4.00    Types: Cigarettes    Quit date: 2020    Years since quitting: 2.0  . Smokeless tobacco: Never Used  Vaping Use  . Vaping Use: Never used  Substance and Sexual Activity  . Alcohol use: Not Currently    Comment: 12/14/20 none in > 2 yrs "occasionally" I have a beer.  estimates 4 beers/week  . Drug use: No  . Sexual activity: Not on file  Other Topics Concern  . Not on file  Social History Narrative   Lives at home with husband   No caffeine   Social Determinants of Radio broadcast assistant Strain: Not on file  Food  Insecurity: Not on file  Transportation Needs: Not on file  Physical Activity: Not on file  Stress: Not on file  Social Connections: Not on file  Intimate Partner Violence: Not on file     PHYSICAL EXAM  GENERAL EXAM/CONSTITUTIONAL: Vitals:  Vitals:   12/14/20 1106  BP: (!) 122/52  Pulse: 84  Weight: 126 lb 6.4 oz (57.3 kg)  Height: 5\' 6"  (1.676 m)   No data found.  Wt Readings from Last 3 Encounters:  12/14/20 126 lb 6.4 oz (57.3 kg)  07/28/20 115 lb (52.2 kg)  07/07/20 116 lb (52.6 kg)   Body mass index is 20.4 kg/m. No exam data present  Patient is in no distress; well developed, nourished and groomed; neck is supple  HOARSE VOICE  CARDIOVASCULAR:  Examination of carotid arteries is normal; no carotid bruits  Regular rate and rhythm, no murmurs  Examination of peripheral vascular system by observation and palpation is normal  EYES:  RIGHT EYE POST-SURG; LIGHT PERCEPTION ONLY  LEFT EYE CATARACT; ophthalmoscopic exam of optic disc and posterior segment is normal; no papilledema or hemorrhages  MUSCULOSKELETAL:  Gait, strength, tone, movements noted in Neurologic exam below  NEUROLOGIC: MENTAL STATUS:   No flowsheet data found.  awake, alert, oriented to person, place and time  recent and remote memory intact  normal attention and concentration  language fluent, comprehension intact, naming intact,   fund of knowledge appropriate  CRANIAL NERVE:   2nd - no papilledema on fundoscopic exam; RIGHT EYE POST-SURGICAL  2nd, 3rd, 4th, 6th - RIGHT PUPIL NO REACTION POST-OP; LEFT EYE 3-->2 reactive to light, LEFT EYE visual fields full to confrontation, extraocular muscles --> SACCADIC DYSMETRIA; no nystagmus  5th - facial sensation symmetric  7th - facial strength symmetric  8th - hearing intact  9th - palate elevates symmetrically, uvula midline  11th - shoulder shrug symmetric  12th - tongue protrusion midline  HOARSE VOICE  MOTOR:   normal bulk   BRADYKINESIA IN BUE AND BLE; NO TREMOR; NO RIGIDITY  full strength in the BUE, BLE; EXCEPT RIGHT WRIST LIMITED BY PAIN  SENSORY:   normal and symmetric to light touch, temperature, vibration  COORDINATION:   finger-nose-finger, fine finger movements SLOW  REFLEXES:   deep tendon reflexes TRACE and symmetric  POSITIVE SNOUT AND MYERSONS REFLEXES; NEG PALMOMENTAL  GAIT/STATION:   narrow based gait; SHORT STEPS; STOOPED POSTURE; DECR ARM SWING     DIAGNOSTIC DATA (LABS, IMAGING, TESTING) - I reviewed patient records, labs, notes, testing and imaging myself where available.  Lab Results  Component Value Date   WBC 14.3 (H) 06/02/2020   HGB 10.1 (L) 06/02/2020   HCT 33.1 (L) 06/02/2020   MCV 92.5 06/02/2020   PLT 584 (H) 06/02/2020      Component Value Date/Time   NA 137 06/02/2020 0431   K 4.1 06/02/2020 0431   CL 101 06/02/2020 0431   CO2 27 06/02/2020 0431   GLUCOSE 70 06/02/2020 0431   BUN 24 (H) 06/02/2020 0431   CREATININE 0.72 06/02/2020 0431   CALCIUM 9.4 06/02/2020 0431   PROT 6.9 06/02/2020 0431   ALBUMIN 2.7 (L) 06/02/2020 0431   AST 20 06/02/2020 0431   ALT 27 06/02/2020 0431    ALKPHOS 75 06/02/2020 0431   BILITOT 0.6 06/02/2020 0431   GFRNONAA >60 06/02/2020 0431   GFRAA >60 06/02/2020 0431   Lab Results  Component Value Date   CHOL 145 07/16/2020   HDL 85 07/16/2020   LDLCALC  34 07/16/2020   TRIG 161 (H) 07/16/2020   CHOLHDL 1.7 07/16/2020   Lab Results  Component Value Date   HGBA1C 10.3 (H) 05/19/2020   No results found for: [id number] Lab Results  Component Value Date   TSH 0.585 06/25/2012    05/26/16 MRI BRAIN [report only] - chronic small vessel ischemic disease with lacunar infarcts in left thalamus and right cerebellum  02/10/19 MRI brain 1. No acute intracranial abnormality. 2. Progressive chronic small vessel ischemic disease from 2010 including multiple lacunar infarcts as above.  10/04/19 carotid u/s Right Carotid: Velocities in the right ICA are consistent with a 1-39% stenosis. Left Carotid: Velocities in the left ICA are consistent with a 1-39% stenosis. Vertebrals: Bilateral vertebral arteries demonstrate antegrade flow.      ASSESSMENT AND PLAN  80 y.o. year old female here with intermittent dizziness, balance difficulty, slow movements, masked facies, since 2018. Suspect symptoms are related to combination of lacunar ischemic infarctions, diabetic neuropathy.   Dx:   1. Dizziness   2. Gait abnormality      PLAN:   GAIT DIFF (due to stroke, neuropathy) - check MRI cervical spine (rule out cervical myelopathy) - continue PT exercises - use cane/walker at all times  ORTHOSTATIC DIZZINESS (may be related to diabetic neuropathy, right eye blindness and anxiety) - use cane/walker; maintain BP log and follow up with PCP  MULTIPLE LACUNAR STROKES  - continue stroke prevention (aspirin, diabetes tx, lipid mgmt, BP control)  DEPRESSION / ANXIETY - consider therapist/counselor to help with stress / depression  Orders Placed This Encounter  Procedures  . MR CERVICAL SPINE WO CONTRAST  . Vitamin B12   Return for  pending if symptoms worsen or fail to improve.    Penni Bombard, MD [id number], [id number] AM Certified in Neurology, Neurophysiology and Neuroimaging  [facility] [address] [place], [place] [address] [phone]

## 2020-12-15 LAB — VITAMIN B12: Vitamin B-12: 821 pg/mL (ref 232–1245)

## 2021-01-04 ENCOUNTER — Ambulatory Visit
Admission: RE | Admit: 2021-01-04 | Discharge: 2021-01-04 | Disposition: A | Discharge status: Home / Self Care | Payer: Medicare Other | Source: Ambulatory Visit | Attending: Diagnostic Neuroimaging | Admitting: Diagnostic Neuroimaging

## 2021-01-04 ENCOUNTER — Other Ambulatory Visit: Payer: Self-pay

## 2021-01-04 DIAGNOSIS — R269 Unspecified abnormalities of gait and mobility: Secondary | ICD-10-CM

## 2021-01-04 DIAGNOSIS — R42 Dizziness and giddiness: Secondary | ICD-10-CM | POA: Diagnosis not present

## 2021-01-06 ENCOUNTER — Telehealth: Payer: Self-pay | Admitting: *Deleted

## 2021-01-06 NOTE — Telephone Encounter (Addendum)
Called patient and informed her Vit B12 lab is normal. Advised her MRI cervical spine showed mMild degenerative changes. Dr Leta Baptist advised she continue conservative management with PT. Patient verbalized understanding, appreciation.

## 2021-01-20 NOTE — Progress Notes (Shared)
[facility] Note  01/21/2021     CHIEF COMPLAINT Patient presents for No chief complaint on file.   HISTORY OF PRESENT ILLNESS: Katherine [name] is a 80 y.o. female who presents to the clinic today for:   pt states her eye sometimes feels like it has something in it, but when she uses Timolol it seems to help, Dr. [name] gave her a rx for Restasis which she is using BID OU  Referring physician: Lucianne Lei, MD [address] [place],  [place] [address]  HISTORICAL INFORMATION:   Selected notes from the MEDICAL RECORD NUMBER Referred by Dr. [name] for eval of ret traction OS.   CURRENT MEDICATIONS: Current Outpatient Medications (Ophthalmic Drugs)  Medication Sig  . RESTASIS MULTIDOSE 0.05 % ophthalmic emulsion 1 drop 2 (two) times daily.  . timolol (TIMOPTIC) 0.5 % ophthalmic solution Place 1 drop into both eyes 2 (two) times daily.    No current facility-administered medications for this visit. (Ophthalmic Drugs)   Current Outpatient Medications (Other)  Medication Sig  . acetaminophen (TYLENOL) 500 MG tablet Take 500-1,000 mg by mouth every 6 (six) hours as needed (for pain).  Marland Kitchen aspirin 81 MG EC tablet Take by mouth. 12/14/20 takes 3 x weekly  . citalopram (CELEXA) 10 MG tablet Take 1 tablet by mouth once as needed.  . gabapentin (NEURONTIN) 300 MG capsule Take 1 capsule (300 mg total) by mouth 2 (two) times daily. (Patient not taking: Reported on 12/14/2020)  . glimepiride (AMARYL) 2 MG tablet Take 1 tablet (2 mg total) by mouth daily with breakfast.  . lisinopril (ZESTRIL) 5 MG tablet Take 0.5 tablets (2.5 mg total) by mouth daily.  . metFORMIN (GLUCOPHAGE) 500 MG tablet Take 1 tablet (500 mg total) by mouth 2 (two) times daily with a meal.  . metoprolol succinate (TOPROL-XL) 25 MG 24 hr tablet Take 0.5 tablets (12.5 mg total) by mouth daily. Take with or immediately following a meal.  . mirtazapine (REMERON) 30 MG tablet TAKE 1 TABLET(30  MG) BY MOUTH AT BEDTIME (Patient taking differently: Take 30 mg by mouth at bedtime as needed (for sleep).)  . Multiple Vitamin (MULTIVITAMIN) tablet Take 1 tablet by mouth 3 (three) times a week.   . nitroGLYCERIN (NITROSTAT) 0.4 MG SL tablet DISSOLVE 1 TABLET UNDER THE TONGUE EVERY 5 MINUTES AS NEEDED FOR CHEST PAIN  . rosuvastatin (CRESTOR) 10 MG tablet TAKE 1 TABLET(10 MG) BY MOUTH DAILY   No current facility-administered medications for this visit. (Other)      REVIEW OF SYSTEMS:    ALLERGIES No Known Allergies  PAST MEDICAL HISTORY Past Medical History:  Diagnosis Date  . Essential tremor 09/25/2019  . Exotropia of right eye    AND HYPERTROPIA  . Glaucoma   . Hyperlipidemia   . Hypertension   . Incomplete right bundle branch block   . LAFB (left anterior fascicular block)   . SUI (stress urinary incontinence, female)   . Type 2 diabetes mellitus ([place])   . Wears dentures    upper denture and lower partial denture  . Wears glasses    Past Surgical History:  Procedure Laterality Date  . ADJUSTABLE SUTURE MANIPULATION Right 03/29/2016   Procedure: ADJUSTABLE SUTURE RIGHT EYE LATERAL RECTUS RESECTION RIGHT EYE ;  Surgeon: Gevena Cotton, MD;  Location: [facility];  Service: Ophthalmology;  Laterality: Right;  . CHOLECYSTECTOMY OPEN  1968  . EYE SURGERY  age 72  repair right eye injury  . GLAUCOMA SURGERY Right 08-23-2015  at [facility]  . INCISION AND DRAINAGE ABSCESS N/A 05/20/2020   Procedure: INCISION AND DRAINAGE OF NECK ABSCESS;  Surgeon: Izora Gala, MD;  Location: [place];  Service: ENT;  Laterality: N/A;  . INTUBATION-ENDOTRACHEAL WITH TRACHEOSTOMY STANDBY N/A 05/19/2020   Procedure: Warrick Parisian WITH TRACHEOSTOMY STANDBY;  Surgeon: Jerrell Belfast, MD;  Location: [place];  Service: ENT;  Laterality: N/A;  . MEDIAN RECTUS REPAIR Right 03/29/2016   Procedure: MEDIAN RECTUS RESECTION RIGHT EYE ADJUSTABLE SUTURE RIGHT EYE LATERAL RECTUS RESECTION  RIGHT EYE ;  Surgeon: Gevena Cotton, MD;  Location: [facility];  Service: Ophthalmology;  Laterality: Right;    FAMILY HISTORY Family History  Problem Relation Age of Onset  . Emphysema Father   . Heart failure Father   . Heart failure Sister   . COPD Brother     SOCIAL HISTORY Social History   Tobacco Use  . Smoking status: Former Smoker    Packs/day: 0.50    Years: 8.00    Pack years: 4.00    Types: Cigarettes    Quit date: 2020    Years since quitting: 2.1  . Smokeless tobacco: Never Used  Vaping Use  . Vaping Use: Never used  Substance Use Topics  . Alcohol use: Not Currently    Comment: 12/14/20 none in > 2 yrs "occasionally" I have a beer.  estimates 4 beers/week  . Drug use: No         OPHTHALMIC EXAM:  Not recorded     IMAGING AND PROCEDURES  Imaging and Procedures for 01/21/2021           ASSESSMENT/PLAN:    ICD-10-CM   1. 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 Epiretinal membrane (ERM) of left eye  H35.372   3. Retinal edema  H35.81   4. Essential hypertension  I10   5. Hypertensive retinopathy of both eyes  H35.033   6. Combined forms of age-related cataract of left eye  H25.812   7. Low vision of right eye  H54.511A   8. Aphakia, right  H27.01   9. Primary open angle glaucoma (POAG) of right eye, severe stage  H40.1113     1-3. VMT and ERM OS  - central vitreomacular traction with cystic changes and +ERM - no significant change from prior on exam and OCT  - BCVA 20/25 +1  - pt denies metamorphopsia  - discuss findings, prognosis and treatment options  - recommend observation for now  - f/u 2 months, DFE, OCT  4,5. Hypertensive retinopathy OU - discussed importance of tight BP control - monitor  6. Mixed Cataract OS - The symptoms of cataract, surgical options, and treatments and risks were discussed with patient. - discussed diagnosis and progression - approaching visual significance  7,8. Low vision and  aphakia OD  - long standing history of low vision OD  - history of trauma and cataract OD as a child and underwent cataract extraction as a juvenile  - remains aphakic with corneal haze and edema  - BCVA HM OD  - stable  9. POAG OD  - IOP today: 18  - on timoptic QAM  - under the expert management of Dr. [name]  Ophthalmic Meds Ordered this visit:  No orders of the defined types were placed in this encounter.     No follow-ups on file.  There are no Patient Instructions on file for this visit.  Explained the  diagnoses, plan, and follow up with the patient and they expressed understanding.  Patient expressed understanding of the importance of proper follow up care.  This document serves as a record of services personally performed by Gardiner Sleeper, MD, PhD. It was created on their behalf by San Jetty. Owens Shark, OA an ophthalmic technician. The creation of this record is the provider's dictation and/or activities during the visit.    Electronically signed by: San Jetty. [place], [place] 03.10.2022 8:02 AM  Gardiner Sleeper, M.D., Ph.D. Diseases & Surgery of the Retina and Vitreous [facility]   Abbreviations: M myopia (nearsighted); A astigmatism; H hyperopia (farsighted); P presbyopia; Mrx spectacle prescription;  CTL contact lenses; OD right eye; OS left eye; OU both eyes  XT exotropia; ET esotropia; PEK punctate epithelial keratitis; PEE punctate epithelial erosions; DES dry eye syndrome; MGD meibomian gland dysfunction; ATs artificial tears; PFAT's preservative free artificial tears; [place] nuclear sclerotic cataract; PSC posterior subcapsular cataract; ERM epi-retinal membrane; PVD posterior vitreous detachment; RD retinal detachment; DM diabetes mellitus; DR diabetic retinopathy; NPDR non-proliferative diabetic retinopathy; PDR proliferative diabetic retinopathy; CSME clinically significant macular edema; DME diabetic macular edema; dbh dot blot hemorrhages; CWS cotton  wool spot; POAG primary open angle glaucoma; C/D cup-to-disc ratio; HVF humphrey visual field; GVF goldmann visual field; OCT optical coherence tomography; IOP intraocular pressure; BRVO Branch retinal vein occlusion; CRVO central retinal vein occlusion; CRAO central retinal artery occlusion; BRAO branch retinal artery occlusion; RT retinal tear; SB scleral buckle; PPV pars plana vitrectomy; VH Vitreous hemorrhage; PRP panretinal laser photocoagulation; IVK intravitreal kenalog; VMT vitreomacular traction; MH Macular hole;  NVD neovascularization of the disc; NVE neovascularization elsewhere; AREDS age related eye disease study; ARMD age related macular degeneration; POAG primary open angle glaucoma; EBMD epithelial/anterior basement membrane dystrophy; ACIOL anterior chamber intraocular lens; IOL intraocular lens; PCIOL posterior chamber intraocular lens; Phaco/IOL phacoemulsification with intraocular lens placement; [place] photorefractive keratectomy; LASIK laser assisted in situ keratomileusis; HTN hypertension; DM diabetes mellitus; COPD chronic obstructive pulmonary disease

## 2021-01-21 ENCOUNTER — Encounter (INDEPENDENT_AMBULATORY_CARE_PROVIDER_SITE_OTHER): Payer: Medicare Other | Admitting: Ophthalmology

## 2021-01-21 DIAGNOSIS — H25812 Combined forms of age-related cataract, left eye: Secondary | ICD-10-CM

## 2021-01-21 DIAGNOSIS — H54511A Low vision right eye category 1, normal vision left eye: Secondary | ICD-10-CM

## 2021-01-21 DIAGNOSIS — I10 Essential (primary) hypertension: Secondary | ICD-10-CM

## 2021-01-21 DIAGNOSIS — H401113 Primary open-angle glaucoma, right eye, severe stage: Secondary | ICD-10-CM

## 2021-01-21 DIAGNOSIS — H35033 Hypertensive retinopathy, bilateral: Secondary | ICD-10-CM

## 2021-01-21 DIAGNOSIS — H43822 Vitreomacular adhesion, left eye: Secondary | ICD-10-CM

## 2021-01-21 DIAGNOSIS — H35372 Puckering of macula, left eye: Secondary | ICD-10-CM

## 2021-01-21 DIAGNOSIS — H2701 Aphakia, right eye: Secondary | ICD-10-CM

## 2021-01-21 DIAGNOSIS — H3581 Retinal edema: Secondary | ICD-10-CM

## 2021-01-24 ENCOUNTER — Other Ambulatory Visit: Payer: Self-pay | Admitting: Cardiology

## 2021-02-02 ENCOUNTER — Other Ambulatory Visit: Payer: Self-pay | Admitting: Cardiology

## 2021-02-02 DIAGNOSIS — R0789 Other chest pain: Secondary | ICD-10-CM

## 2021-02-23 NOTE — Progress Notes (Signed)
[facility] Note  02/25/2021     CHIEF COMPLAINT Patient presents for Retina Follow Up   HISTORY OF PRESENT ILLNESS: Katherine [name] is a 80 y.o. female who presents to the clinic today for:   HPI    Retina Follow Up    Patient presents with  Other.  In left eye.  This started 3 months ago.  I, the attending physician,  performed the HPI with the patient and updated documentation appropriately.          Comments    Patient here for 3 month retina follow up for VMT/ERM OS. Patient states vision about the same. Light may be slight dimmer. OD sometimes feels like has sand in it. Still using Restasis BID OU and Timolol BID OU.       Last edited by [name], MD on 02/27/2021  1:17 AM. (History)    pt states vision is dimmer in her left eye  Referring physician: [name], MD [place],  [place] [address]  HISTORICAL INFORMATION:   Selected notes from the MEDICAL RECORD NUMBER Referred by Dr. [name] for eval of ret traction OS.   CURRENT MEDICATIONS: Current Outpatient Medications (Ophthalmic Drugs)  Medication Sig  . RESTASIS MULTIDOSE 0.05 % ophthalmic emulsion 1 drop 2 (two) times daily.  . timolol (TIMOPTIC) 0.5 % ophthalmic solution Place 1 drop into both eyes 2 (two) times daily.    No current facility-administered medications for this visit. (Ophthalmic Drugs)   Current Outpatient Medications (Other)  Medication Sig  . acetaminophen (TYLENOL) 500 MG tablet Take 500-1,000 mg by mouth every 6 (six) hours as needed (for pain).  Marland Kitchen aspirin 81 MG EC tablet Take by mouth. 12/14/20 takes 3 x weekly  . citalopram (CELEXA) 10 MG tablet Take 1 tablet by mouth once as needed.  . gabapentin (NEURONTIN) 300 MG capsule Take 1 capsule (300 mg total) by mouth 2 (two) times daily. (Patient not taking: Reported on 12/14/2020)  . glimepiride (AMARYL) 2 MG tablet Take 1 tablet (2 mg total) by mouth daily with breakfast.  .  lisinopril (ZESTRIL) 5 MG tablet Take 0.5 tablets (2.5 mg total) by mouth daily.  . metFORMIN (GLUCOPHAGE) 500 MG tablet Take 1 tablet (500 mg total) by mouth 2 (two) times daily with a meal.  . metoprolol succinate (TOPROL-XL) 25 MG 24 hr tablet TAKE 1/2 TABLET(12.5 MG) BY MOUTH DAILY WITH OR IMMEDIATELY FOLLOWING A MEAL  . mirtazapine (REMERON) 30 MG tablet TAKE 1 TABLET(30 MG) BY MOUTH AT BEDTIME (Patient taking differently: Take 30 mg by mouth at bedtime as needed (for sleep).)  . Multiple Vitamin (MULTIVITAMIN) tablet Take 1 tablet by mouth 3 (three) times a week.   . nitroGLYCERIN (NITROSTAT) 0.4 MG SL tablet DISSOLVE 1 TABLET UNDER THE TONGUE EVERY 5 MINUTES AS NEEDED FOR CHEST PAIN  . rosuvastatin (CRESTOR) 10 MG tablet TAKE 1 TABLET(10 MG) BY MOUTH DAILY   No current facility-administered medications for this visit. (Other)      REVIEW OF SYSTEMS: ROS    Positive for: Genitourinary, Musculoskeletal, Endocrine, Cardiovascular, Eyes   Negative for: Constitutional, Gastrointestinal, Neurological, Skin, HENT, Respiratory, Psychiatric, Allergic/Imm, Heme/Lymph   Last edited by Theodore Demark, COA on 02/25/2021  2:26 PM. (History)       ALLERGIES No Known Allergies  PAST MEDICAL HISTORY Past Medical History:  Diagnosis Date  . Essential tremor 09/25/2019  . Exotropia of right eye  AND HYPERTROPIA  . Glaucoma   . Hyperlipidemia   . Hypertension   . Incomplete right bundle branch block   . LAFB (left anterior fascicular block)   . SUI (stress urinary incontinence, female)   . Type 2 diabetes mellitus ([place])   . Wears dentures    upper denture and lower partial denture  . Wears glasses    Past Surgical History:  Procedure Laterality Date  . ADJUSTABLE SUTURE MANIPULATION Right 03/29/2016   Procedure: ADJUSTABLE SUTURE RIGHT EYE LATERAL RECTUS RESECTION RIGHT EYE ;  Surgeon: Gevena Cotton, MD;  Location: [facility];  Service: Ophthalmology;  Laterality:  Right;  . CHOLECYSTECTOMY OPEN  1968  . EYE SURGERY  age 9   repair right eye injury  . GLAUCOMA SURGERY Right 08-23-2015  at [facility]  . INCISION AND DRAINAGE ABSCESS N/A 05/20/2020   Procedure: INCISION AND DRAINAGE OF NECK ABSCESS;  Surgeon: Izora Gala, MD;  Location: [facility];  Service: ENT;  Laterality: N/A;  . INTUBATION-ENDOTRACHEAL WITH TRACHEOSTOMY STANDBY N/A 05/19/2020   Procedure: Warrick Parisian WITH TRACHEOSTOMY STANDBY;  Surgeon: Jerrell Belfast, MD;  Location: [facility] [place];  Service: ENT;  Laterality: N/A;  . MEDIAN RECTUS REPAIR Right 03/29/2016   Procedure: MEDIAN RECTUS RESECTION RIGHT EYE ADJUSTABLE SUTURE RIGHT EYE LATERAL RECTUS RESECTION RIGHT EYE ;  Surgeon: Gevena Cotton, MD;  Location: [facility];  Service: Ophthalmology;  Laterality: Right;    FAMILY HISTORY Family History  Problem Relation Age of Onset  . Emphysema Father   . Heart failure Father   . Heart failure Sister   . COPD Brother     SOCIAL HISTORY Social History   Tobacco Use  . Smoking status: Former Smoker    Packs/day: 0.50    Years: 8.00    Pack years: 4.00    Types: Cigarettes    Quit date: 2020    Years since quitting: 2.2  . Smokeless tobacco: Never Used  Vaping Use  . Vaping Use: Never used  Substance Use Topics  . Alcohol use: Not Currently    Comment: 12/14/20 none in > 2 yrs "occasionally" I have a beer.  estimates 4 beers/week  . Drug use: No         OPHTHALMIC EXAM:  Base Eye Exam    Visual Acuity (Snellen - Linear)      Right Left   Dist cc HM @ face 20/20   Dist ph cc NI    Correction: Glasses       Tonometry (Tonopen, 2:22 PM)      Right Left   Pressure 17 16       Pupils      Dark Light Shape React APD   Right   Irregular NR    Left 3 2 Round Brisk None       Visual Fields (Counting fingers)      Left Right    Full    Restrictions  Total superior temporal, inferior temporal, superior nasal, inferior nasal deficiencies        Extraocular Movement      Right Left    Full Full       Neuro/Psych    Oriented x3: Yes   Mood/Affect: Normal       Dilation    Both eyes: 1.0% Mydriacyl, 2.5% Phenylephrine @ 2:22 PM        Slit Lamp and Fundus Exam    Slit Lamp Exam      Right Left  Lids/Lashes Dermatochalasis - upper lid Dermatochalasis - upper lid, Meibomian gland dysfunction   Conjunctiva/Sclera mild melanosis mild melanosis   Cornea Arcus, diffuse haze/edema, endo pigment, Descemet's folds dense Arcus, trace Punctate epithelial erosions   Anterior Chamber deep, narrow angles deep, narrow angles, No cell or flare   Iris irregular Round and dilated   Lens Aphakia 3-4+ Nuclear sclerosis with brunescence, 3+ Cortical cataract   Vitreous very hazy view Vitreous syneresis Vitreous syneresis       Fundus Exam      Right Left   Disc hazy view, 3+ Pallor, +cupping, Sharp rim Pink and Sharp, Compact   C/D Ratio  0.3   Macula hazy view, grossly flat, RPE mottling and clumping, no details visible Flat, Blunted foveal reflex, central Cystic changes/VMT, RPE mottling and clumping, no heme   Vessels hazy view, attenuated, Tortuous attenuated, mild tortuousity   Periphery hazy view, grossly attached Attached           Refraction    Wearing Rx      Sphere Cylinder Axis Add   Right +4.00 +0.75 146 +2.50   Left +3.50 +0.75 073 +2.50          IMAGING AND PROCEDURES  Imaging and Procedures for 02/25/2021  OCT, Retina - OU - Both Eyes       Right Eye Quality was poor. Findings include (Poor image quality, retina attached, no details visible).   Left Eye Quality was good. Central Foveal Thickness: 298. Progression has been stable. Findings include abnormal foveal contour, vitreous traction, intraretinal fluid, no SRF, epiretinal membrane (No significant change from prior).   Notes *Images captured and stored on drive  Diagnosis / Impression:  OD: Poor image quality, retina attached, no details  visible OS: +ERM; +VMT w/ central macular cysts; no SRF -- No significant change from prior  Clinical management:  See below  Abbreviations: NFP - Normal foveal profile. CME - cystoid macular edema. PED - pigment epithelial detachment. IRF - intraretinal fluid. SRF - subretinal fluid. EZ - ellipsoid zone. ERM - epiretinal membrane. ORA - outer retinal atrophy. ORT - outer retinal tubulation. SRHM - subretinal hyper-reflective material. IRHM - intraretinal hyper-reflective material                 ASSESSMENT/PLAN:    ICD-10-CM   1. 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 Epiretinal membrane (ERM) of left eye  H35.372   3. Retinal edema  H35.81 OCT, Retina - OU - Both Eyes  4. Essential hypertension  I10   5. Hypertensive retinopathy of both eyes  H35.033   6. Combined forms of age-related cataract of left eye  H25.812   7. Low vision of right eye  H54.511A   8. Aphakia, right  H27.01   9. Primary open angle glaucoma (POAG) of right eye, severe stage  H40.1113     1-3. VMT and ERM OS  - central vitreomacular traction with cystic changes and +ERM - no significant change from prior on exam and OCT  - BCVA 20/25 +1  - pt denies metamorphopsia  - discuss findings, prognosis and treatment options  - recommend observation for now  - f/u 3 months, DFE, OCT  4,5. Hypertensive retinopathy OU - discussed importance of tight BP control - monitor  6. Mixed Cataract OS  - under the expert management of Dr. [name] - The symptoms of cataract, surgical options, and treatments and risks were discussed with patient. - discussed diagnosis and progression - approaching  visual significance - clear from a retina standpoint to proceed with cataract surgery when pt and surgeon are ready   7,8. Low vision and aphakia OD  - long standing history of low vision OD  - history of trauma and cataract OD as a child and underwent cataract extraction as a juvenile  - remains aphakic with  corneal haze and edema  - BCVA HM OD  - stable  9. POAG OD  - IOP today: 17  - on timoptic QAM  - under the expert management of Dr. [name]  Ophthalmic Meds Ordered this visit:  No orders of the defined types were placed in this encounter.     Return in about 3 months (around 05/27/2021) for f/u VMT and ERM OS, DFE, OCT.  There are no Patient Instructions on file for this visit.  Explained the diagnoses, plan, and follow up with the patient and they expressed understanding.  Patient expressed understanding of the importance of proper follow up care.  This document serves as a record of services personally performed by Gardiner Sleeper, MD, PhD. It was created on their behalf by San Jetty. Owens Shark, OA an ophthalmic technician. The creation of this record is the provider's dictation and/or activities during the visit.    Electronically signed by: San Jetty. Owens Shark, [place] 04.13.2022 1:20 AM  Gardiner Sleeper, M.D., Ph.D. Diseases & Surgery of the Retina and Vitreous [facility]  I have reviewed the above documentation for accuracy and completeness, and I agree with the above. Gardiner Sleeper, M.D., Ph.D. 02/27/21 1:20 AM  Abbreviations: M myopia (nearsighted); A astigmatism; H hyperopia (farsighted); P presbyopia; Mrx spectacle prescription;  CTL contact lenses; OD right eye; OS left eye; OU both eyes  XT exotropia; ET esotropia; PEK punctate epithelial keratitis; PEE punctate epithelial erosions; DES dry eye syndrome; MGD meibomian gland dysfunction; ATs artificial tears; PFAT's preservative free artificial tears; [place] nuclear sclerotic cataract; PSC posterior subcapsular cataract; ERM epi-retinal membrane; PVD posterior vitreous detachment; RD retinal detachment; DM diabetes mellitus; DR diabetic retinopathy; NPDR non-proliferative diabetic retinopathy; PDR proliferative diabetic retinopathy; CSME clinically significant macular edema; DME diabetic macular edema; dbh dot  blot hemorrhages; CWS cotton wool spot; POAG primary open angle glaucoma; C/D cup-to-disc ratio; HVF humphrey visual field; GVF goldmann visual field; OCT optical coherence tomography; IOP intraocular pressure; BRVO Branch retinal vein occlusion; CRVO central retinal vein occlusion; CRAO central retinal artery occlusion; BRAO branch retinal artery occlusion; RT retinal tear; SB scleral buckle; PPV pars plana vitrectomy; VH Vitreous hemorrhage; PRP panretinal laser photocoagulation; IVK intravitreal kenalog; VMT vitreomacular traction; MH Macular hole;  NVD neovascularization of the disc; NVE neovascularization elsewhere; AREDS age related eye disease study; ARMD age related macular degeneration; POAG primary open angle glaucoma; EBMD epithelial/anterior basement membrane dystrophy; ACIOL anterior chamber intraocular lens; IOL intraocular lens; PCIOL posterior chamber intraocular lens; Phaco/IOL phacoemulsification with intraocular lens placement; [place] photorefractive keratectomy; LASIK laser assisted in situ keratomileusis; HTN hypertension; DM diabetes mellitus; COPD chronic obstructive pulmonary disease

## 2021-02-25 ENCOUNTER — Other Ambulatory Visit: Payer: Self-pay

## 2021-02-25 ENCOUNTER — Ambulatory Visit (INDEPENDENT_AMBULATORY_CARE_PROVIDER_SITE_OTHER): Payer: Medicare Other | Admitting: Ophthalmology

## 2021-02-25 ENCOUNTER — Encounter (INDEPENDENT_AMBULATORY_CARE_PROVIDER_SITE_OTHER): Payer: Self-pay | Admitting: Ophthalmology

## 2021-02-25 DIAGNOSIS — H43822 Vitreomacular adhesion, left eye: Secondary | ICD-10-CM

## 2021-02-25 DIAGNOSIS — H35372 Puckering of macula, left eye: Secondary | ICD-10-CM

## 2021-02-25 DIAGNOSIS — I10 Essential (primary) hypertension: Secondary | ICD-10-CM | POA: Diagnosis not present

## 2021-02-25 DIAGNOSIS — H35033 Hypertensive retinopathy, bilateral: Secondary | ICD-10-CM

## 2021-02-25 DIAGNOSIS — H2701 Aphakia, right eye: Secondary | ICD-10-CM

## 2021-02-25 DIAGNOSIS — H401113 Primary open-angle glaucoma, right eye, severe stage: Secondary | ICD-10-CM

## 2021-02-25 DIAGNOSIS — H54511A Low vision right eye category 1, normal vision left eye: Secondary | ICD-10-CM

## 2021-02-25 DIAGNOSIS — H25812 Combined forms of age-related cataract, left eye: Secondary | ICD-10-CM

## 2021-02-25 DIAGNOSIS — H3581 Retinal edema: Secondary | ICD-10-CM | POA: Diagnosis not present

## 2021-02-27 ENCOUNTER — Encounter (INDEPENDENT_AMBULATORY_CARE_PROVIDER_SITE_OTHER): Payer: Self-pay | Admitting: Ophthalmology

## 2021-04-15 ENCOUNTER — Other Ambulatory Visit: Payer: Self-pay | Admitting: Cardiology

## 2021-04-23 IMAGING — DX DG CHEST 1V PORT
1 series · 1 of 1 positions shown · non-contrast
Comparison: 05/23/2020

CLINICAL DATA: Evaluate for pneumonia.  Shortness of breath.

EXAM:
PORTABLE CHEST 1 VIEW

[chest]
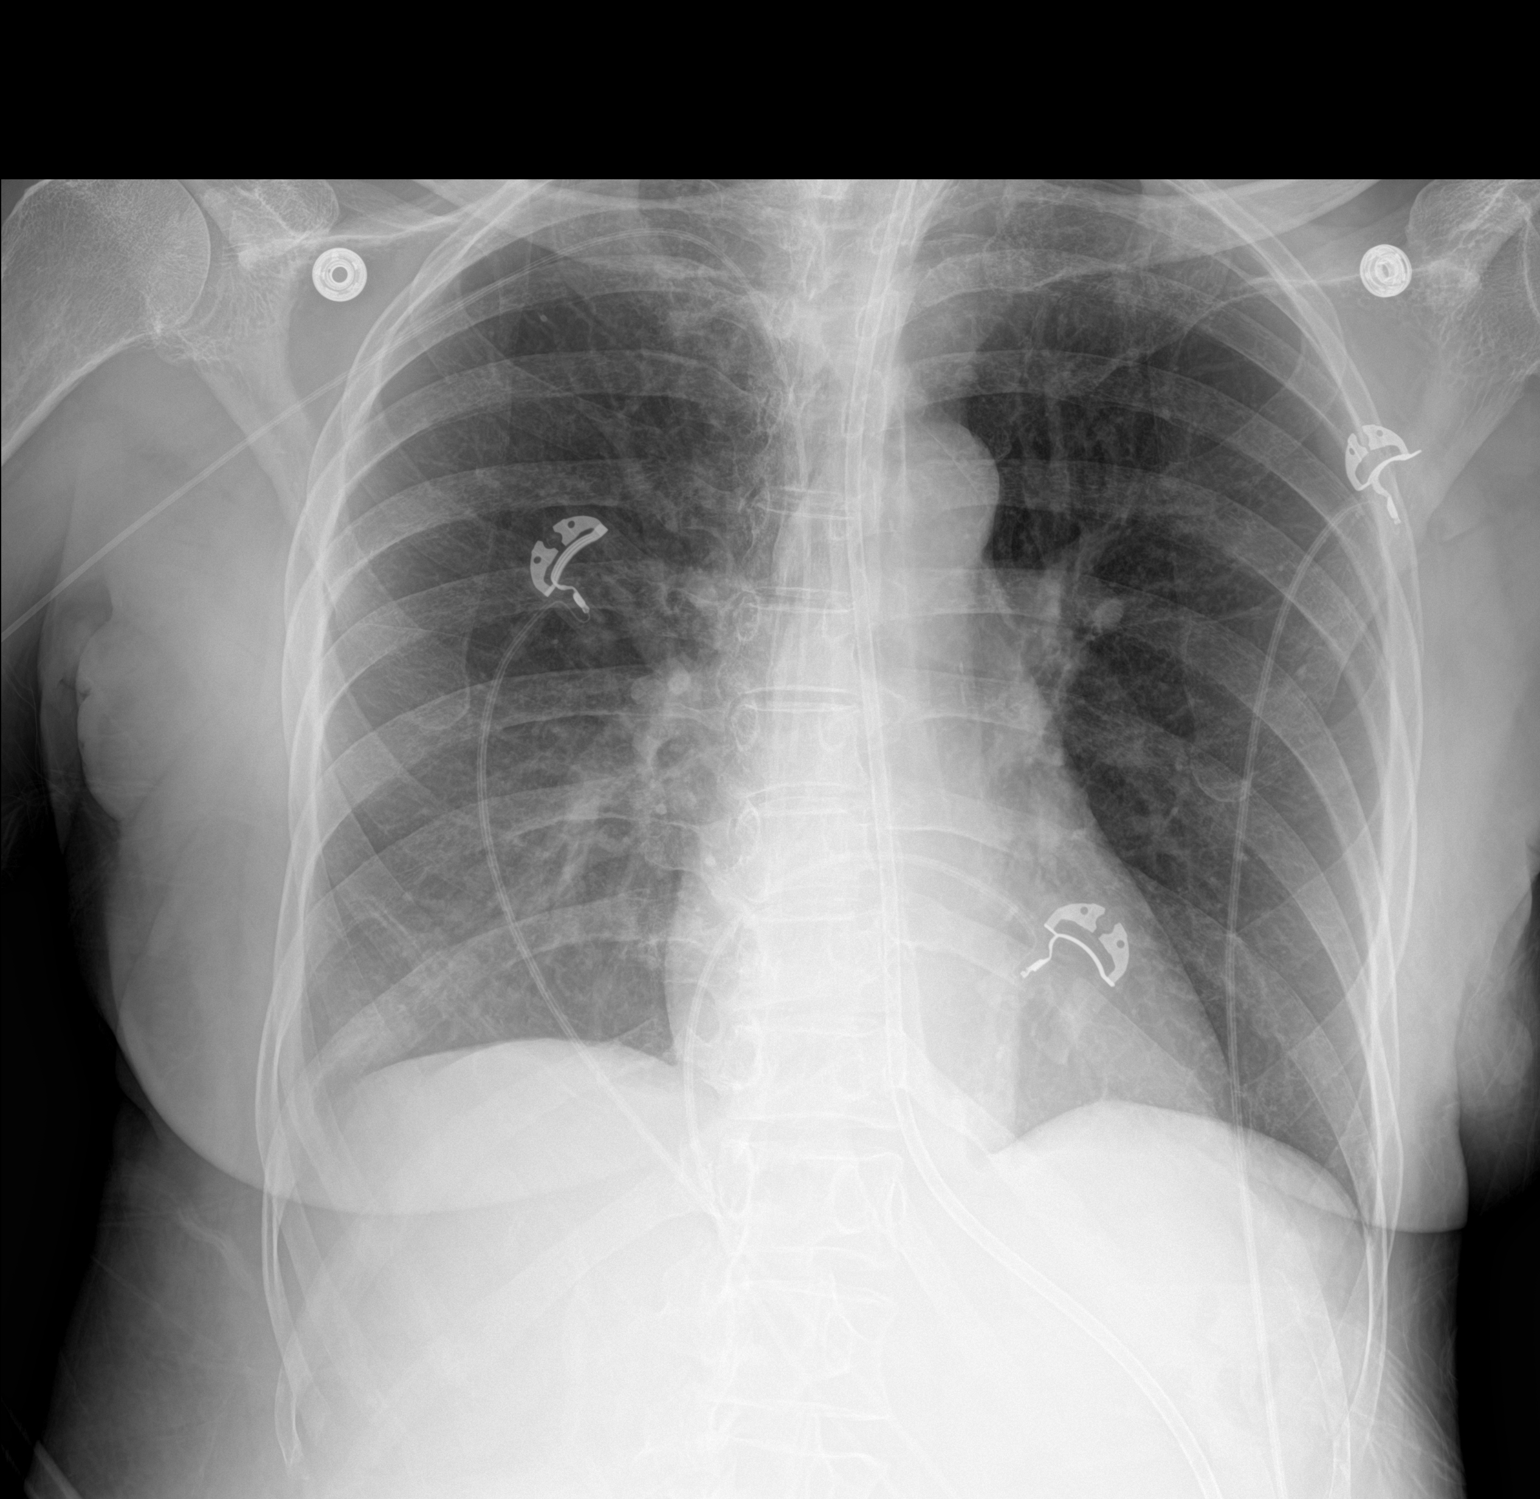

[1 of 1 positions shown; findings below may reference images not displayed]

FINDINGS: Endotracheal tube has been removed in the interim. Feeding tube
extends into the abdomen but the tip is beyond the image. Right arm
PICC line tip in the SVC. Both lungs are clear. Heart and
mediastinum are within normal limits. Negative for a pneumothorax.
Bone structures are unremarkable.
IMPRESSION: No acute cardiopulmonary disease.

Support apparatuses as described.

## 2021-05-20 ENCOUNTER — Other Ambulatory Visit: Payer: Self-pay | Admitting: Cardiology

## 2021-05-20 DIAGNOSIS — R0789 Other chest pain: Secondary | ICD-10-CM

## 2021-05-20 DIAGNOSIS — E782 Mixed hyperlipidemia: Secondary | ICD-10-CM

## 2021-05-27 ENCOUNTER — Encounter (INDEPENDENT_AMBULATORY_CARE_PROVIDER_SITE_OTHER): Payer: Medicare Other | Admitting: Ophthalmology

## 2021-07-13 ENCOUNTER — Other Ambulatory Visit: Payer: Self-pay | Admitting: Cardiology

## 2022-02-14 ENCOUNTER — Other Ambulatory Visit: Payer: Self-pay | Admitting: Cardiology

## 2022-05-18 ENCOUNTER — Ambulatory Visit
Admission: RE | Admit: 2022-05-18 | Discharge: 2022-05-18 | Disposition: A | Discharge status: Home / Self Care | Payer: Medicare Other | Source: Ambulatory Visit | Attending: Family Medicine | Admitting: Family Medicine

## 2022-05-18 ENCOUNTER — Other Ambulatory Visit: Payer: Self-pay | Admitting: Family Medicine

## 2022-05-18 DIAGNOSIS — R109 Unspecified abdominal pain: Secondary | ICD-10-CM

## 2022-08-13 ENCOUNTER — Other Ambulatory Visit: Payer: Self-pay | Admitting: Cardiology

## 2023-02-12 ENCOUNTER — Telehealth: Payer: Self-pay | Admitting: *Deleted

## 2023-02-12 NOTE — Patient Outreach (Signed)
  Care Coordination   Initial Visit Note   02/12/2023 Name: Katherine [name] MRN: [id number] DOB: January 07, 1941  Katherine [name] is a 82 y.o. year old female who sees Lucianne Lei, MD for primary care. I spoke with  Joneen Boers by phone today.  What matters to the patients health and wellness today?  Pt plans to discuss Care Coordination program with her husband and call back.    Goals Addressed   None     SDOH assessments and interventions completed:  No     Care Coordination Interventions:  No, not indicated   Follow up plan:  Pt requested to discuss with her husband and call back    Encounter Outcome:  Pt. Visit Completed
